# Patient Record
Sex: Male | Born: 1960 | Race: White | Hispanic: No | Marital: Married | Smoking: Former smoker
Health system: Southern US, Community
[De-identification: ages and names within clinical notes are randomized; demographics above are authoritative.]

## PROBLEM LIST (undated history)

## (undated) DIAGNOSIS — J449 Chronic obstructive pulmonary disease, unspecified: Secondary | ICD-10-CM

## (undated) DIAGNOSIS — E079 Disorder of thyroid, unspecified: Secondary | ICD-10-CM

## (undated) DIAGNOSIS — S069XAA Unspecified intracranial injury with loss of consciousness status unknown, initial encounter: Secondary | ICD-10-CM

## (undated) DIAGNOSIS — N189 Chronic kidney disease, unspecified: Secondary | ICD-10-CM

## (undated) DIAGNOSIS — K219 Gastro-esophageal reflux disease without esophagitis: Secondary | ICD-10-CM

## (undated) DIAGNOSIS — M1990 Unspecified osteoarthritis, unspecified site: Secondary | ICD-10-CM

## (undated) DIAGNOSIS — M419 Scoliosis, unspecified: Secondary | ICD-10-CM

## (undated) DIAGNOSIS — S069X9A Unspecified intracranial injury with loss of consciousness of unspecified duration, initial encounter: Secondary | ICD-10-CM

## (undated) HISTORY — DX: Gastro-esophageal reflux disease without esophagitis: K21.9

## (undated) HISTORY — DX: Chronic kidney disease, unspecified: N18.9

## (undated) HISTORY — PX: COLONOSCOPY: SHX174

## (undated) HISTORY — DX: Chronic obstructive pulmonary disease, unspecified: J44.9

## (undated) HISTORY — PX: POLYPECTOMY: SHX149

## (undated) HISTORY — DX: Unspecified osteoarthritis, unspecified site: M19.90

---

## 2004-08-23 ENCOUNTER — Emergency Department (HOSPITAL_COMMUNITY): Admission: EM | Admit: 2004-08-23 | Discharge: 2004-08-23 | Payer: Self-pay | Admitting: Family Medicine

## 2006-05-08 ENCOUNTER — Emergency Department (HOSPITAL_COMMUNITY): Admission: EM | Admit: 2006-05-08 | Discharge: 2006-05-08 | Payer: Self-pay | Admitting: Emergency Medicine

## 2006-08-09 ENCOUNTER — Encounter: Admission: RE | Admit: 2006-08-09 | Discharge: 2006-08-09 | Payer: Self-pay | Admitting: General Practice

## 2007-10-12 ENCOUNTER — Emergency Department (HOSPITAL_COMMUNITY): Admission: EM | Admit: 2007-10-12 | Discharge: 2007-10-12 | Payer: Self-pay | Admitting: Emergency Medicine

## 2007-12-14 ENCOUNTER — Emergency Department (HOSPITAL_COMMUNITY): Admission: EM | Admit: 2007-12-14 | Discharge: 2007-12-15 | Payer: Self-pay | Admitting: Emergency Medicine

## 2008-05-12 ENCOUNTER — Ambulatory Visit: Payer: Self-pay | Admitting: Nurse Practitioner

## 2008-05-12 DIAGNOSIS — R21 Rash and other nonspecific skin eruption: Secondary | ICD-10-CM

## 2008-05-12 DIAGNOSIS — M4120 Other idiopathic scoliosis, site unspecified: Secondary | ICD-10-CM | POA: Insufficient documentation

## 2008-05-12 DIAGNOSIS — M25569 Pain in unspecified knee: Secondary | ICD-10-CM

## 2008-05-12 DIAGNOSIS — G8929 Other chronic pain: Secondary | ICD-10-CM | POA: Insufficient documentation

## 2008-05-12 DIAGNOSIS — G3184 Mild cognitive impairment, so stated: Secondary | ICD-10-CM | POA: Insufficient documentation

## 2008-05-12 LAB — CONVERTED CEMR LAB
ALT: 33 units/L (ref 0–53)
Albumin: 4.2 g/dL (ref 3.5–5.2)
Alkaline Phosphatase: 53 units/L (ref 39–117)
Basophils Absolute: 0 10*3/uL (ref 0.0–0.1)
Basophils Relative: 1 % (ref 0–1)
Bilirubin Urine: NEGATIVE
Blood in Urine, dipstick: NEGATIVE
Cholesterol: 240 mg/dL — ABNORMAL HIGH (ref 0–200)
Creatinine, Ser: 1.21 mg/dL (ref 0.40–1.50)
Eosinophils Absolute: 0.1 10*3/uL (ref 0.0–0.7)
Eosinophils Relative: 1 % (ref 0–5)
Glucose, Bld: 96 mg/dL (ref 70–99)
Glucose, Urine, Semiquant: NEGATIVE
HDL: 49 mg/dL (ref >39)
Ketones, urine, test strip: NEGATIVE
LDL Cholesterol: 167 mg/dL — ABNORMAL HIGH (ref 0–99)
Lymphocytes Relative: 26 % (ref 12–46)
MCV: 96.5 fL (ref 78.0–100.0)
Monocytes Relative: 10 % (ref 3–12)
Neutrophils Relative %: 62 % (ref 43–77)
Platelets: 185 10*3/uL (ref 150–400)
RDW: 15.8 % — ABNORMAL HIGH (ref 11.5–15.5)
Total CHOL/HDL Ratio: 4.9
Urobilinogen, UA: 0.2
VLDL: 24 mg/dL (ref 0–40)
WBC: 5.8 10*3/uL (ref 4.0–10.5)
pH: 6

## 2008-05-13 ENCOUNTER — Encounter (INDEPENDENT_AMBULATORY_CARE_PROVIDER_SITE_OTHER): Payer: Self-pay | Admitting: Nurse Practitioner

## 2008-05-13 DIAGNOSIS — E039 Hypothyroidism, unspecified: Secondary | ICD-10-CM | POA: Insufficient documentation

## 2008-05-13 DIAGNOSIS — E7800 Pure hypercholesterolemia, unspecified: Secondary | ICD-10-CM

## 2008-05-13 LAB — CONVERTED CEMR LAB
Free T4: 0.36 ng/dL — ABNORMAL LOW (ref 0.89–1.80)
T3, Free: 1.6 pg/mL — ABNORMAL LOW (ref 2.3–4.2)

## 2008-05-15 ENCOUNTER — Ambulatory Visit (HOSPITAL_COMMUNITY): Admission: RE | Admit: 2008-05-15 | Discharge: 2008-05-15 | Payer: Self-pay | Admitting: Nurse Practitioner

## 2008-05-15 ENCOUNTER — Telehealth (INDEPENDENT_AMBULATORY_CARE_PROVIDER_SITE_OTHER): Payer: Self-pay | Admitting: *Deleted

## 2008-05-15 ENCOUNTER — Encounter (INDEPENDENT_AMBULATORY_CARE_PROVIDER_SITE_OTHER): Payer: Self-pay | Admitting: Nurse Practitioner

## 2008-06-04 ENCOUNTER — Ambulatory Visit: Payer: Self-pay | Admitting: Nurse Practitioner

## 2008-07-27 ENCOUNTER — Ambulatory Visit: Payer: Self-pay | Admitting: Nurse Practitioner

## 2008-07-27 LAB — CONVERTED CEMR LAB
AST: 13 units/L (ref 0–37)
Albumin: 4.3 g/dL (ref 3.5–5.2)
Bilirubin, Direct: 0.1 mg/dL (ref 0.0–0.3)
Indirect Bilirubin: 0.4 mg/dL (ref 0.0–0.9)
T3, Total: 181.3 ng/dL (ref 80.0–204.0)

## 2008-07-28 ENCOUNTER — Encounter (INDEPENDENT_AMBULATORY_CARE_PROVIDER_SITE_OTHER): Payer: Self-pay | Admitting: Nurse Practitioner

## 2008-08-27 ENCOUNTER — Encounter (INDEPENDENT_AMBULATORY_CARE_PROVIDER_SITE_OTHER): Payer: Self-pay | Admitting: Nurse Practitioner

## 2008-11-03 ENCOUNTER — Ambulatory Visit: Payer: Self-pay | Admitting: Nurse Practitioner

## 2008-11-03 LAB — CONVERTED CEMR LAB: Cholesterol, target level: 200 mg/dL

## 2008-11-04 ENCOUNTER — Encounter (INDEPENDENT_AMBULATORY_CARE_PROVIDER_SITE_OTHER): Payer: Self-pay | Admitting: Nurse Practitioner

## 2008-11-04 LAB — CONVERTED CEMR LAB
Alkaline Phosphatase: 63 units/L (ref 39–117)
BUN: 24 mg/dL — ABNORMAL HIGH (ref 6–23)
Chloride: 101 meq/L (ref 96–112)
LDL Cholesterol: 203 mg/dL — ABNORMAL HIGH (ref 0–99)
Potassium: 4.2 meq/L (ref 3.5–5.3)
Sodium: 138 meq/L (ref 135–145)
Total Bilirubin: 0.7 mg/dL (ref 0.3–1.2)
Total CHOL/HDL Ratio: 6.3
Triglycerides: 235 mg/dL — ABNORMAL HIGH (ref <150)
VLDL: 47 mg/dL — ABNORMAL HIGH (ref 0–40)

## 2008-12-15 ENCOUNTER — Ambulatory Visit: Payer: Self-pay | Admitting: Nurse Practitioner

## 2008-12-16 LAB — CONVERTED CEMR LAB
Cholesterol: 167 mg/dL (ref 0–200)
TSH: 7.342 microintl units/mL — ABNORMAL HIGH (ref 0.350–4.50)
Total CHOL/HDL Ratio: 4.3
Triglycerides: 136 mg/dL (ref <150)
VLDL: 27 mg/dL (ref 0–40)

## 2010-08-01 ENCOUNTER — Emergency Department (HOSPITAL_COMMUNITY): Admission: EM | Admit: 2010-08-01 | Discharge: 2010-08-01 | Payer: Self-pay | Admitting: Emergency Medicine

## 2011-08-24 LAB — BASIC METABOLIC PANEL
BUN: 13
Chloride: 105
GFR calc Af Amer: >60
GFR calc non Af Amer: 51 — ABNORMAL LOW
Potassium: 4.1

## 2011-08-24 LAB — DIFFERENTIAL
Eosinophils Relative: 2
Monocytes Absolute: 0.8

## 2011-08-24 LAB — CBC
HCT: 41
Hemoglobin: 13.8
Platelets: 178
WBC: 8.1

## 2011-09-12 LAB — CULTURE, ROUTINE-ABSCESS: Gram Stain: NONE SEEN

## 2012-01-12 ENCOUNTER — Emergency Department (HOSPITAL_COMMUNITY): Payer: Medicaid Other

## 2012-01-12 ENCOUNTER — Emergency Department (HOSPITAL_COMMUNITY)
Admission: EM | Admit: 2012-01-12 | Discharge: 2012-01-12 | Disposition: A | Discharge status: Home / Self Care | Payer: Medicaid Other | Attending: Emergency Medicine | Admitting: Emergency Medicine

## 2012-01-12 ENCOUNTER — Encounter (HOSPITAL_COMMUNITY): Payer: Self-pay | Admitting: Adult Health

## 2012-01-12 DIAGNOSIS — M25429 Effusion, unspecified elbow: Secondary | ICD-10-CM

## 2012-01-12 DIAGNOSIS — W1809XA Striking against other object with subsequent fall, initial encounter: Secondary | ICD-10-CM | POA: Insufficient documentation

## 2012-01-12 DIAGNOSIS — Y93K1 Activity, walking an animal: Secondary | ICD-10-CM | POA: Insufficient documentation

## 2012-01-12 DIAGNOSIS — S53106A Unspecified dislocation of unspecified ulnohumeral joint, initial encounter: Secondary | ICD-10-CM

## 2012-01-12 HISTORY — DX: Unspecified intracranial injury with loss of consciousness status unknown, initial encounter: S06.9XAA

## 2012-01-12 HISTORY — DX: Disorder of thyroid, unspecified: E07.9

## 2012-01-12 HISTORY — DX: Scoliosis, unspecified: M41.9

## 2012-01-12 HISTORY — DX: Unspecified intracranial injury with loss of consciousness of unspecified duration, initial encounter: S06.9X9A

## 2012-01-12 MED ORDER — FENTANYL CITRATE 0.05 MG/ML IJ SOLN
50.0000 ug | Freq: Once | INTRAMUSCULAR | Status: AC
  Administered 2012-01-12: 50 ug via INTRAVENOUS

## 2012-01-12 MED ORDER — FENTANYL CITRATE 0.05 MG/ML IJ SOLN
25.0000 ug | Freq: Once | INTRAMUSCULAR | Status: AC
  Administered 2012-01-12: 25 ug via INTRAVENOUS
  Filled 2012-01-12: qty 2

## 2012-01-12 MED ORDER — ETOMIDATE 2 MG/ML IV SOLN
12.0000 mg | Freq: Once | INTRAVENOUS | Status: DC
  Filled 2012-01-12: qty 10

## 2012-01-12 MED ORDER — ETOMIDATE 2 MG/ML IV SOLN
INTRAVENOUS | Status: AC | PRN
  Administered 2012-01-12: 12 mg via INTRAVENOUS

## 2012-01-12 MED ORDER — MIDAZOLAM HCL 2 MG/2ML IJ SOLN
2.0000 mg | Freq: Once | INTRAMUSCULAR | Status: AC
  Administered 2012-01-12: 2 mg via INTRAVENOUS
  Filled 2012-01-12: qty 2

## 2012-01-12 MED ORDER — OXYCODONE-ACETAMINOPHEN 5-325 MG PO TABS: ORAL_TABLET | ORAL | Status: DC

## 2012-01-12 MED ORDER — OXYCODONE-ACETAMINOPHEN 5-325 MG PO TABS
2.0000 | ORAL_TABLET | Freq: Once | ORAL | Status: AC
  Administered 2012-01-12: 2 via ORAL
  Filled 2012-01-12: qty 2

## 2012-01-12 NOTE — ED Notes (Signed)
While walking dog pt slipped and fell down 3 concrete steps. Left arm deformity, CMS intact.

## 2012-01-12 NOTE — ED Provider Notes (Signed)
History     CSN: [id number]  Arrival date & time 01/12/12  5621   First MD Initiated Contact with Patient 01/12/12 0818      Chief Complaint  Patient presents with  . Arm Pain  . Fall    (Consider location/radiation/quality/duration/timing/severity/associated sxs/prior treatment) HPI Comments: Patient reports that he was in his usual state of health had not yet drink anything, had been outside to walk his dog. His dog which is quite large told him causing the patient to fall and he must have landed on his left elbow or wrist. The patient has deformity and significant pain to his left elbow worse with that. EMS was called and has transported the patient here with an IV in place in his right arm. The patient and EMS report no other pain areas and no other known injuries. The patient is left-handed. The patient denies any hand or wrist pain, forearm pain or left shoulder pain. At this time he denies any numbness or weakness to his distal hand. Patient denies being on any blood thinners. He does have a history of low thyroid but has quit taking his medication. EMS also reports that the patient has a prior history of significant brain injury as well as scoliosis. The patient reports that he has been a sign to a physician but has not seen a primary care physician and a while. By his prior notes here, his primary care physician is a Dr. Daphine Deutscher. Patient reports that he quit smoking many years ago and does not drink. PT was given a total of 100 mcg of IV fentanyl PTA and he reports pain is much improved.    Patient is a 51 y.o. male presenting with arm pain and fall. The history is provided by the patient and the EMS personnel.  Arm Pain Pertinent negatives include no chest pain, no abdominal pain and no shortness of breath.  Fall Pertinent negatives include no numbness and no abdominal pain.    Past Medical History  Diagnosis Date  . Thyroid disease   . Brain injury   . Scoliosis     History  reviewed. No pertinent past surgical history.  History reviewed. No pertinent family history.  History  Substance Use Topics  . Smoking status: Never Smoker   . Smokeless tobacco: Not on file  . Alcohol Use: No      Review of Systems  Constitutional: Negative.   HENT: Negative for neck pain.   Respiratory: Negative for shortness of breath.   Cardiovascular: Negative for chest pain.  Gastrointestinal: Negative for abdominal pain.  Musculoskeletal: Positive for joint swelling and arthralgias. Negative for back pain.  Neurological: Negative for dizziness, weakness, light-headedness and numbness.  All other systems reviewed and are negative.    Allergies  Review of patient's allergies indicates no known allergies.  Home Medications   Current Outpatient Rx  Name Route Sig Dispense Refill  . BC HEADACHE POWDER PO Oral Take 1 packet by mouth 2 (two) times daily as needed. For pain.    . OXYCODONE-ACETAMINOPHEN 5-325 MG PO TABS  1-2 tablets po q 6 hours prn severe pain 20 tablet 0    BP 124/87  Pulse 74  Temp(Src) 97.6 F (36.4 C) (Oral)  Resp 13  SpO2 94%  Physical Exam  Nursing note and vitals reviewed. Constitutional: He is oriented to person, place, and time. He appears well-developed and well-nourished.  Neck: Full passive range of motion without pain. Neck supple. No spinous process tenderness and  no muscular tenderness present.  Pulmonary/Chest: Effort normal.  Abdominal: Soft. There is no tenderness. There is no guarding and no CVA tenderness.  Musculoskeletal:       Arms: Neurological: He is alert and oriented to person, place, and time. He has normal strength. No sensory deficit. GCS eye subscore is 4. GCS verbal subscore is 5. GCS motor subscore is 6.  Skin: Skin is warm and dry. No abrasion and no rash noted. He is not diaphoretic. No cyanosis.  Psychiatric: He has a normal mood and affect. His speech is not delayed. He is not slowed. He does not express  impulsivity or inappropriate judgment. He exhibits normal recent memory.    ED Course  Reduction of dislocation Date/Time: 01/12/2012 9:48 AM Performed by: Lear Ng. Authorized by: Lear Ng Consent: Verbal consent obtained. Written consent obtained. Risks and benefits: risks, benefits and alternatives were discussed Consent given by: patient and spouse Patient understanding: patient states understanding of the procedure being performed Patient consent: the patient's understanding of the procedure matches consent given Procedure consent: procedure consent matches procedure scheduled Relevant documents: relevant documents present and verified Imaging studies: imaging studies available Patient identity confirmed: verbally with patient Time out: Immediately prior to procedure a "time out" was called to verify the correct patient, procedure, equipment, support staff and site/side marked as required. Patient sedated: yes Sedation type: moderate (conscious) sedation Sedatives: etomidate, fentanyl and midazolam Analgesia: fentanyl Sedation start date/time: 01/12/2012 9:15 AM Sedation end date/time: 01/12/2012 9:25 AM Vitals: Vital signs were monitored during sedation. Patient tolerance: Patient tolerated the procedure well with no immediate complications.   (including critical care time)  Labs Reviewed - No data to display Dg Elbow 2 Views Left  01/12/2012  *RADIOLOGY REPORT*  Clinical Data: Post reduction  LEFT ELBOW - 2 VIEW  Comparison: Plain film 01/12/2012  Findings: The elbow joint has been relocated.  No evidence of fracture.  IMPRESSION: Successful reduction of left elbow dislocation  Original Report Authenticated By: Genevive Bi, M.D.   Dg Elbow Complete Left  01/12/2012  *RADIOLOGY REPORT*  Clinical Data: Status post fall.  LEFT ELBOW - COMPLETE 3+ VIEW  Comparison: None  Findings: There has been a posterior dislocation of the elbow joint.  Tiny ossific density within  the olecranon fossa is identified which may represent a small fracture fragment.  Donor site unknown.  A large joint effusion is present.  IMPRESSION:  1.  Posterior elbow joint dislocation. 2.  Tiny ossific density noted within the olecranon fossa is of uncertain significance but may represent a tiny fracture.  Original Report Authenticated By: Rosealee Albee, M.D.     1. Dislocation, elbow closed   2. Elbow joint effusion       MDM  Pt has been NPO, will keep NPO.  Start maintenance IVF's, IV analgesics and will obtain plain films to assess for possible fracture and/or dislocation.  Pt is left handed.  Distal pulse and gross sensation and motor are intact currently.        8:45 AM Elbow is dislocated, without any obvious fracture on my view.  Will sedate and reduce.  Pt and spouse are informed and will give verbal and written consent.   Risks and benefits discussed, pt on monitor and O2.      9:50 AM On my view, portable post reduction view shows reduction of dislocation.  Will need follow up.  Pt has intact ROM of wrist, cap refill and RP are intact.  Compartments remain soft although sig elbow effusion is present.  Placed in posterior splint and sling.  Will refer to Dr. Lestine Box with [facility].     12:03 PM Spoke to Dr. Mina Marble who will see pt on Tuesday.  Pt is to call office for appt time.  Gavin Pound. Toneka Fullen, MD 01/12/12 1203

## 2012-01-12 NOTE — ED Notes (Signed)
EAV:[id number]> Expected date:01/12/12<BR> Expected time: 7:57 AM<BR> Means of arrival:Ambulance<BR> Comments:<BR> Fall-obvious deformity to elbow

## 2012-01-12 NOTE — Discharge Instructions (Signed)
 Elbow Dislocation Elbow dislocation is the displacement of the bones that form the elbow joint. Three bones come together to form the elbow. The humerus is the bone in the upper arm. The radius and ulna are the 2 bones in the forearm that form the lower part of the elbow. The elbow is held in place by very strong, fibrous tissues (ligaments) that connect the bones to each other. CAUSES Elbow dislocations are not common. Typically, they occur when a person falls forward with hands and elbows outstretched. The force of the impact is sent to the elbow. Usually, there is a twisting motion in this force. Elbow dislocations also happen during car crashes when passengers reach out to brace themselves during the impact. RISK FACTORS Although dislocation of the elbow can happen to anyone, some people are at greater risk than others. People at increased risk of elbow dislocation include:  People born with greater looseness in their ligaments.   People born with an ulna bone that has a shallow groove for the elbow hinge joint.  SYMPTOMS Symptoms of a complete elbow dislocation usually are obvious. They include extreme pain and the appearance of a deformed arm.  Symptoms of a partial dislocation may not be obvious. Your elbow may move somewhat, but you may have pain and swelling. Also, there will likely be bruising on the inside and outside of your elbow where ligaments have been stretched or torn.  DIAGNOSIS  To diagnose elbow dislocation, your caregiver will perform a physical exam. During this exam, your caregiver will check your arm for tenderness, swelling, and deformity. The skin around your arm and the circulation in your arm also will be checked. Your pulse will be checked at your wrist. If your artery is injured during dislocation, your hand will be cool to the touch and may be white or purple in color. Your caregiver also may check your arm and your ability to move your wrist and fingers to see if you  had any damage to your nerves during dislocation. An X-ray exam also may be done to determine if there is bone injury. Results of an X-ray exam can help show the direction of the dislocation. If you have a simple dislocation, there is no major bone injury. If you have a complex dislocation, you may have broken bones (fractures) associated with the ligament injuries. TREATMENT For a simple elbow dislocation, your bones can usually be realigned in a procedure called a reduction. This is a treatment in which your bones are manually moved back into place either with the use of numbing medicine (regional anesthetic) around your elbow or medicine to make you sleep (general anesthetic). Then your elbow is kept immobile with a sling or a splint for 2 to 3 weeks. This is followed with physical therapy to help your joint move again. Complex elbow dislocation may require surgery to restore joint alignment and repair ligaments. After surgery, your elbow may be protected with an external hinge. This device keeps your elbow from dislocating again while motion exercises are done. Additional surgery may be needed to repair any injuries to blood vessels and nerves or bones and ligaments or to relieve pressure from excessive swelling around the muscles. HOME CARE INSTRUCTIONS The following measures can help to reduce pain and hasten the healing process:  Rest your injured joint. Do not move it. Avoid activities similar to the one that caused your injury.   Exercise your hand and fingers as instructed by your caregiver.   Apply ice  to your injured joint for 1 to 2 days after your reduction or as directed by your caregiver. Applying ice helps to reduce inflammation and pain.   Put ice in a plastic bag.   Place a towel between your skin and the bag.   Leave the ice on for 15 to 20 minutes at a time, every couple of hours while you are awake.   Elevate your arm above your heart and move your wrist and fingers as  instructed by your caregiver to help limit swelling.   Take over-the-counter or prescription medicines for pain as directed by your caregiver.  SEEK IMMEDIATE MEDICAL CARE IF:  Your splint becomes damaged.   You have an external hinge and it becomes loose or will not move.   You have an external hinge and you develop drainage around the pins.   Your pain becomes worse rather than better.   You lose feeling in your hand or fingers.  MAKE SURE YOU:  Understand these instructions.   Will watch your condition.   Will get help right away if you are not doing well or get worse.  Document Released: 11/14/2001 Document Revised: 08/02/2011 Document Reviewed: 04/20/2011 [facility] Patient Information 2012 [place], [place].  Narcotic and benzodiazepine use may cause drowsiness, slowed breathing or dependence.  Please use with caution and do not drive, operate machinery or watch young children alone while taking them.  Taking combinations of these medications or drinking alcohol will potentiate these effects.

## 2012-01-30 ENCOUNTER — Ambulatory Visit: Payer: Medicaid Other | Attending: Orthopedic Surgery | Admitting: Occupational Therapy

## 2012-01-30 DIAGNOSIS — IMO0001 Reserved for inherently not codable concepts without codable children: Secondary | ICD-10-CM | POA: Insufficient documentation

## 2012-01-30 DIAGNOSIS — M25539 Pain in unspecified wrist: Secondary | ICD-10-CM | POA: Insufficient documentation

## 2012-01-30 DIAGNOSIS — R609 Edema, unspecified: Secondary | ICD-10-CM | POA: Insufficient documentation

## 2013-09-20 ENCOUNTER — Encounter (HOSPITAL_COMMUNITY): Payer: Self-pay | Admitting: Emergency Medicine

## 2013-09-20 ENCOUNTER — Emergency Department (HOSPITAL_COMMUNITY)
Admission: EM | Admit: 2013-09-20 | Discharge: 2013-09-20 | Disposition: A | Discharge status: Home / Self Care | Payer: Medicaid Other | Attending: Emergency Medicine | Admitting: Emergency Medicine

## 2013-09-20 DIAGNOSIS — Z8639 Personal history of other endocrine, nutritional and metabolic disease: Secondary | ICD-10-CM | POA: Insufficient documentation

## 2013-09-20 DIAGNOSIS — S139XXA Sprain of joints and ligaments of unspecified parts of neck, initial encounter: Secondary | ICD-10-CM | POA: Insufficient documentation

## 2013-09-20 DIAGNOSIS — Z862 Personal history of diseases of the blood and blood-forming organs and certain disorders involving the immune mechanism: Secondary | ICD-10-CM | POA: Insufficient documentation

## 2013-09-20 DIAGNOSIS — S161XXA Strain of muscle, fascia and tendon at neck level, initial encounter: Secondary | ICD-10-CM

## 2013-09-20 DIAGNOSIS — X500XXA Overexertion from strenuous movement or load, initial encounter: Secondary | ICD-10-CM | POA: Insufficient documentation

## 2013-09-20 DIAGNOSIS — Z87820 Personal history of traumatic brain injury: Secondary | ICD-10-CM | POA: Insufficient documentation

## 2013-09-20 DIAGNOSIS — Y929 Unspecified place or not applicable: Secondary | ICD-10-CM | POA: Insufficient documentation

## 2013-09-20 DIAGNOSIS — Y9389 Activity, other specified: Secondary | ICD-10-CM | POA: Insufficient documentation

## 2013-09-20 MED ORDER — DIAZEPAM 5 MG PO TABS: 5.0000 mg | ORAL_TABLET | Freq: Four times a day (QID) | ORAL | Status: DC | PRN

## 2013-09-20 MED ORDER — IBUPROFEN 800 MG PO TABS: 800.0000 mg | ORAL_TABLET | Freq: Three times a day (TID) | ORAL | Status: DC

## 2013-09-20 MED ORDER — HYDROCODONE-ACETAMINOPHEN 5-325 MG PO TABS
1.0000 | ORAL_TABLET | Freq: Once | ORAL | Status: AC
  Administered 2013-09-20: 1 via ORAL
  Filled 2013-09-20: qty 1

## 2013-09-20 MED ORDER — DIAZEPAM 5 MG PO TABS
5.0000 mg | ORAL_TABLET | Freq: Once | ORAL | Status: AC
  Administered 2013-09-20: 5 mg via ORAL
  Filled 2013-09-20: qty 1

## 2013-09-20 MED ORDER — KETOROLAC TROMETHAMINE 60 MG/2ML IM SOLN
60.0000 mg | Freq: Once | INTRAMUSCULAR | Status: AC
  Administered 2013-09-20: 60 mg via INTRAMUSCULAR
  Filled 2013-09-20: qty 2

## 2013-09-20 NOTE — ED Notes (Signed)
Pt c/o L neck stiffness/pain, onset yesterday after chopping wood.

## 2013-09-20 NOTE — ED Provider Notes (Signed)
Medical screening examination/treatment/procedure(s) were performed by non-physician practitioner and as supervising physician I was immediately available for consultation/collaboration.   Audree Camel, MD 09/20/13 1420

## 2013-09-20 NOTE — ED Provider Notes (Signed)
CSN: [id number]     Arrival date & time 09/20/13  0102 History   None    Chief Complaint  Patient presents with  . Torticollis   (Consider location/radiation/quality/duration/timing/severity/associated sxs/prior Treatment) HPI History provided by pt.   Pt was chopping wood yesterday evening and developed intermittent, non-radiating, sharp pain in L posterior neck shortly after.  Aggravated by slight movement.  No associated extremity weakness/paresthesias.  Took a BC powder w/out relief.  Past Medical History  Diagnosis Date  . Thyroid disease   . Brain injury   . Scoliosis    History reviewed. No pertinent past surgical history. No family history on file. History  Substance Use Topics  . Smoking status: Never Smoker   . Smokeless tobacco: Not on file  . Alcohol Use: No    Review of Systems  All other systems reviewed and are negative.    Allergies  Review of patient's allergies indicates no known allergies.  Home Medications   Current Outpatient Rx  Name  Route  Sig  Dispense  Refill  . Aspirin-Salicylamide-Caffeine (BC HEADACHE POWDER PO)   Oral   Take 1 packet by mouth 2 (two) times daily as needed (pain). For pain.          BP 132/75  Pulse 78  Temp(Src) 97.5 F (36.4 C) (Oral)  Resp 18  SpO2 96% Physical Exam  Nursing note and vitals reviewed. Constitutional: He is oriented to person, place, and time. He appears well-developed and well-nourished. No distress.  HENT:  Head: Normocephalic and atraumatic.  Eyes:  Normal appearance  Neck: Normal range of motion.  No carotid bruit  Cardiovascular: Normal rate and regular rhythm.   Pulmonary/Chest: Effort normal and breath sounds normal. No stridor. No respiratory distress.  Musculoskeletal: Normal range of motion.  Cervical spine non-tender.  L trap ttp.  Pain aggravated by head movement.  5/5 bicep/tricep/grip strength bilaterally. 2+ radial pulse and distal sensation intact.      Neurological: He is  alert and oriented to person, place, and time.  Skin: Skin is warm and dry. No rash noted.  Psychiatric: He has a normal mood and affect. His behavior is normal.    ED Course  Procedures (including critical care time) Labs Review Labs Reviewed - No data to display Imaging Review No results found.  EKG Interpretation   None       MDM   1. Cervical strain, initial encounter    52yo M presents w/ L neck pain s/p chopping wood last night.  He is unable to move his head d/t pain on exam.  No NV deficits extremities.  Suspect strain of L trapezius.  Vicodin, valium and IM toradol ordered for pain.  Will reassess shortly.  2:21 AM   Pain improved.  Pt d/c'd home w/ valium and 800mg  ibuprofen.     Otilio Miu, PA-C 09/20/13 [phone]

## 2014-12-04 HISTORY — PX: LYMPHADENECTOMY: SHX15

## 2015-01-10 ENCOUNTER — Emergency Department (HOSPITAL_COMMUNITY)
Admission: EM | Admit: 2015-01-10 | Discharge: 2015-01-10 | Disposition: A | Discharge status: Home / Self Care | Payer: Medicaid Other | Attending: Emergency Medicine | Admitting: Emergency Medicine

## 2015-01-10 ENCOUNTER — Emergency Department (HOSPITAL_COMMUNITY): Payer: Medicaid Other

## 2015-01-10 ENCOUNTER — Encounter (HOSPITAL_COMMUNITY): Payer: Self-pay | Admitting: *Deleted

## 2015-01-10 DIAGNOSIS — Z7982 Long term (current) use of aspirin: Secondary | ICD-10-CM | POA: Insufficient documentation

## 2015-01-10 DIAGNOSIS — Z8739 Personal history of other diseases of the musculoskeletal system and connective tissue: Secondary | ICD-10-CM | POA: Insufficient documentation

## 2015-01-10 DIAGNOSIS — Z791 Long term (current) use of non-steroidal anti-inflammatories (NSAID): Secondary | ICD-10-CM | POA: Diagnosis not present

## 2015-01-10 DIAGNOSIS — R51 Headache: Secondary | ICD-10-CM | POA: Diagnosis present

## 2015-01-10 DIAGNOSIS — R2230 Localized swelling, mass and lump, unspecified upper limb: Secondary | ICD-10-CM

## 2015-01-10 DIAGNOSIS — G44219 Episodic tension-type headache, not intractable: Secondary | ICD-10-CM

## 2015-01-10 DIAGNOSIS — R7989 Other specified abnormal findings of blood chemistry: Secondary | ICD-10-CM | POA: Diagnosis not present

## 2015-01-10 DIAGNOSIS — R591 Generalized enlarged lymph nodes: Secondary | ICD-10-CM | POA: Diagnosis not present

## 2015-01-10 DIAGNOSIS — R590 Localized enlarged lymph nodes: Secondary | ICD-10-CM

## 2015-01-10 LAB — BASIC METABOLIC PANEL
Anion gap: 8 (ref 5–15)
BUN: 14 mg/dL (ref 6–23)
CO2: 28 mmol/L (ref 19–32)
Calcium: 8.5 mg/dL (ref 8.4–10.5)
Chloride: 104 mmol/L (ref 96–112)
Creatinine, Ser: 1.74 mg/dL — ABNORMAL HIGH (ref 0.50–1.35)
GFR calc Af Amer: 50 mL/min — ABNORMAL LOW (ref >90)
GFR calc non Af Amer: 43 mL/min — ABNORMAL LOW (ref >90)
Glucose, Bld: 87 mg/dL (ref 70–99)
Potassium: 4 mmol/L (ref 3.5–5.1)
Sodium: 140 mmol/L (ref 135–145)

## 2015-01-10 LAB — CBC WITH DIFFERENTIAL/PLATELET
Basophils Absolute: 0.1 10*3/uL (ref 0.0–0.1)
Basophils Relative: 1 % (ref 0–1)
Eosinophils Absolute: 0.3 10*3/uL (ref 0.0–0.7)
Eosinophils Relative: 3 % (ref 0–5)
HCT: 42.3 % (ref 39.0–52.0)
Hemoglobin: 13.5 g/dL (ref 13.0–17.0)
Lymphocytes Relative: 24 % (ref 12–46)
Lymphs Abs: 2.5 10*3/uL (ref 0.7–4.0)
MCH: 30.3 pg (ref 26.0–34.0)
MCHC: 31.9 g/dL (ref 30.0–36.0)
MCV: 95.1 fL (ref 78.0–100.0)
Monocytes Absolute: 1.1 10*3/uL — ABNORMAL HIGH (ref 0.1–1.0)
Monocytes Relative: 11 % (ref 3–12)
Neutro Abs: 6.2 10*3/uL (ref 1.7–7.7)
Neutrophils Relative %: 61 % (ref 43–77)
Platelets: 213 10*3/uL (ref 150–400)
RBC: 4.45 MIL/uL (ref 4.22–5.81)
RDW: 15.6 % — ABNORMAL HIGH (ref 11.5–15.5)
WBC: 10.2 10*3/uL (ref 4.0–10.5)

## 2015-01-10 MED ORDER — HYDROCODONE-ACETAMINOPHEN 5-325 MG PO TABS: 1.0000 | ORAL_TABLET | Freq: Four times a day (QID) | ORAL | Status: DC | PRN

## 2015-01-10 MED ORDER — HYDROCODONE-ACETAMINOPHEN 5-325 MG PO TABS
1.0000 | ORAL_TABLET | Freq: Once | ORAL | Status: AC
  Administered 2015-01-10: 1 via ORAL
  Filled 2015-01-10: qty 1

## 2015-01-10 NOTE — ED Provider Notes (Signed)
CSN: [id number]     Arrival date & time 01/10/15  0934 History   First MD Initiated Contact with Patient 01/10/15 1024     Chief Complaint  Patient presents with  . abscess   . Headache     (Consider location/radiation/quality/duration/timing/severity/associated sxs/prior Treatment) HPI Comments: Samuel Luna is a 54 y.o. male with a PMHx of scoliosis, brain injury, and thyroid disease, who presents to the ED with complaints of left axilla mass that began 1 week ago and has increased in size since onset. He states that it started out the size of a quarter and has grown to a golf ball sized area. He states it has become more painful, stating that it is 8/10 constant throbbing pain that radiates to the posterior aspect of the left side of his neck, with no known aggravating factors, unrelieved with aspirin and BC powders, and relieved slightly with pressure to the area. He reports that there has been some warmth, but no drainage, induration, erythema, or fluctuance. He states that last week he had a fever of 102, but this resolved by Monday and has not recurred. He endorses an associated headache to the occipital area of his head stating that it is similar to his prior headaches, coming intermittently, relieved with aspirin and BC powders, and believes it is coming from the pain in his axilla. Additionally his spouse states that he has been more fatigued recently, although the patient doesn't endorse feeling tired. The spouse also states that the patient had some lightheadedness last week, and when asked the patient states that it occurred intermittently and has since resolved entirely and he is not having any ongoing lightheadedness. Patient denies any fevers at this time, chills, chest pain, shortness breath, abdominal pain, nausea, vomiting, diarrhea, constipation, dyspnea., Hematuria, melena, hematochezia, vision changes, tinnitus, vertigo, dizziness, ongoing lightheadedness, numbness, tingling,  weakness, IV drug use, skin injury, alcohol use, or diabetes.  Patient is a 54 y.o. male presenting with headaches and abscess. The history is provided by the patient and the spouse. No language interpreter was used.  Headache Pain location:  Occipital Quality: throbbing. Radiates to:  Does not radiate Severity currently:  8/10 Severity at highest:  8/10 Onset quality:  Gradual Duration:  1 week Timing:  Intermittent Progression:  Unchanged Chronicity:  Recurrent Similar to prior headaches: yes   Relieved by:  Aspirin and NSAIDs Worsened by:  Nothing tried Ineffective treatments:  None tried Associated symptoms: fatigue, neck pain (L sided) and swollen glands (L axilla)   Associated symptoms: no abdominal pain, no back pain, no blurred vision, no cough, no diarrhea, no dizziness, no ear pain, no pain, no facial pain, no fever (subjective fever last week, now resolved), no focal weakness, no hearing loss, no loss of balance, no nausea, no neck stiffness, no numbness, no paresthesias, no photophobia, no sore throat, no tingling, no URI, no visual change, no vomiting and no weakness   Abscess Location:  Shoulder/arm Shoulder/arm abscess location:  L axilla Size:  "golf ball" Abscess quality: painful and warmth   Abscess quality: not draining, no fluctuance, no induration, no itching, no redness and not weeping   Red streaking: no   Duration:  1 week Progression:  Worsening Pain details:    Quality:  Throbbing   Severity:  Moderate   Duration:  1 week   Timing:  Constant   Progression:  Unchanged Chronicity:  New Context: not diabetes, not immunosuppression, not injected drug use, not insect bite/sting and not skin  injury   Relieved by: pressure to the area. Worsened by:  Nothing tried Ineffective treatments:  Aspirin and NSAIDs Associated symptoms: fatigue and headaches   Associated symptoms: no fever (subjective fever last week, now resolved), no nausea and no vomiting      Past Medical History  Diagnosis Date  . Thyroid disease   . Brain injury   . Scoliosis    History reviewed. No pertinent past surgical history. History reviewed. No pertinent family history. History  Substance Use Topics  . Smoking status: Never Smoker   . Smokeless tobacco: Not on file  . Alcohol Use: No    Review of Systems  Constitutional: Positive for fatigue. Negative for fever (subjective fever last week, now resolved), chills and diaphoresis.  HENT: Negative for ear pain, hearing loss and sore throat.   Eyes: Negative for blurred vision, photophobia, pain and visual disturbance.  Respiratory: Negative for cough and shortness of breath.   Cardiovascular: Negative for chest pain.  Gastrointestinal: Negative for nausea, vomiting, abdominal pain, diarrhea, constipation and blood in stool.  Genitourinary: Negative for dysuria and hematuria.  Musculoskeletal: Positive for neck pain (L sided). Negative for back pain, arthralgias and neck stiffness.  Skin: Negative for color change.       +L axilla swelling  Allergic/Immunologic: Negative for immunocompromised state.  Neurological: Positive for headaches. Negative for dizziness, focal weakness, syncope, weakness, light-headedness (previously had intermittently, now resolved), numbness, paresthesias and loss of balance.  Hematological: Positive for adenopathy (L axilla).  Psychiatric/Behavioral: Negative for confusion.   10 Systems reviewed and are negative for acute change except as noted in the HPI.    Allergies  Review of patient's allergies indicates no known allergies.  Home Medications   Prior to Admission medications   Medication Sig Start Date End Date Taking? Authorizing Provider  Aspirin-Salicylamide-Caffeine (BC HEADACHE POWDER PO) Take 1 packet by mouth 2 (two) times daily as needed (pain). For pain.   Yes Historical Provider, MD  diazepam (VALIUM) 5 MG tablet Take 1 tablet (5 mg total) by mouth every 6 (six)  hours as needed (spasms). 09/20/13   Ephraim Hamburger, MD  ibuprofen (ADVIL,MOTRIN) 800 MG tablet Take 1 tablet (800 mg total) by mouth 3 (three) times daily. 09/20/13   Ephraim Hamburger, MD   BP 134/74 mmHg  Pulse 84  Temp(Src) 97.4 F (36.3 C) (Oral)  Resp 16  SpO2 99% Physical Exam  Constitutional: He is oriented to person, place, and time. Vital signs are normal. He appears well-developed and well-nourished.  Non-toxic appearance. No distress.  Afebrile nontoxic NAD  HENT:  Head: Normocephalic and atraumatic.  Mouth/Throat: Mucous membranes are normal.  Lacking dentitia MMM  Eyes: Conjunctivae and EOM are normal. Pupils are equal, round, and reactive to light. Right eye exhibits no discharge. Left eye exhibits no discharge.  PERRL, EOMI, no nystagmus  Neck: Normal range of motion. Neck supple. Muscular tenderness present. No spinous process tenderness present. No rigidity. Normal range of motion present.    FROM intact without spinous process or TTP, no bony stepoffs or deformities, mild L sided paracervical TTP with mild muscle spasms. No rigidity or meningeal signs. No bruising or swelling.   Cardiovascular: Normal rate, regular rhythm, normal heart sounds and intact distal pulses.  Exam reveals no gallop and no friction rub.   No murmur heard. Pulmonary/Chest: Effort normal and breath sounds normal. No respiratory distress. He has no decreased breath sounds. He has no wheezes. He has no rhonchi. He has  no rales.  Abdominal: Soft. Normal appearance and bowel sounds are normal. He exhibits no distension. There is no tenderness. There is no rigidity, no rebound, no guarding, no CVA tenderness, no tenderness at McBurney's point and negative Murphy's sign.  Musculoskeletal: Normal range of motion.  MAE x4 Strength 5/5 in all extremities Sensation grossly intact in all extremities Distal pulses intact FROM intact at b/l shoulders and elbows. L axilla with ~5cm diameter mass without  fluctuance or induration, no erythema or warmth, no cellulitic changes, very TTP, mobile and rubbery.   Lymphadenopathy:    He has axillary adenopathy (L axilla).  L axilla with ?LAD, as noted in MSK exam  Neurological: He is alert and oriented to person, place, and time. He has normal strength. No cranial nerve deficit or sensory deficit. He displays a negative Romberg sign. Coordination and gait normal. GCS eye subscore is 4. GCS verbal subscore is 5. GCS motor subscore is 6.  CN 2-12 grossly intact A&O x4 GCS 15 Sensation and strength intact Gait nonataxic Coordination with finger-to-nose WNL Neg romberg, neg pronator drift   Skin: Skin is warm, dry and intact. No rash noted. No erythema.  No erythema or warmth to L axilla Scab to L antecubital region noted, overlying a vein, concerning for possible track mark. No erythema, warmth, or drainage.   Psychiatric: He has a normal mood and affect.  Nursing note and vitals reviewed.   ED Course  Procedures (including critical care time) Labs Review Labs Reviewed  CBC WITH DIFFERENTIAL/PLATELET - Abnormal; Notable for the following:    RDW 15.6 (*)    Monocytes Absolute 1.1 (*)    All other components within normal limits  BASIC METABOLIC PANEL - Abnormal; Notable for the following:    Creatinine, Ser 1.74 (*)    GFR calc non Af Amer 43 (*)    GFR calc Af Amer 50 (*)    All other components within normal limits    Imaging Review [place] Chest  01/10/2015   CLINICAL DATA:  Left axillary lump/swelling x 1.5 weeks, fever  EXAM: CHEST ULTRASOUND  COMPARISON:  None.  FINDINGS: Palpable abnormality corresponds to multiple enlarged lymph nodes in the left axilla, as follows:  --2.7 x 2.0 x 2.7 cm, with vascularity, with preservation of the fatty hilum  --3.1 x 2.6 x 3.0 cm, without vascularity, with preservation of the fatty hilum  --3.0 x 1.6 x 2.3 cm, with vascularity, with replacement of the fatty hilum  IMPRESSION: Palpable abnormality  corresponds to multiple enlarged lymph nodes in the left axilla, measuring up to 2.6 cm short axis, as above.  Given the abnormal appearance, unless these are clinically thought to be infectious, percutaneous sampling is suggested.   Electronically Signed   By: Julian Hy M.D.   On: 01/10/2015 14:48     EKG Interpretation None      MDM   Final diagnoses:  Lump of axilla  Lymphadenopathy, axillary  Episodic tension-type headache, not intractable  Elevated serum creatinine    54 y.o. male with L axilla mass that has increased in size.  Area does not appear to be abscess, not erythematous or warm to touch, seems deeper and more consistent with a lymph node. Fatigued and has had some lightheadedness intermittently but this is currently resolved. Also states he has a slight headache that comes and goes, seems to be pain that is radiating up from his axilla, tenderness to paracervical muscles on L side. No red flag s/sx for  headache, nonfocal neurologic exam, doubt SAH, CVA/TIA, meningitis, or other intracranial abnormality requiring emergent CT imaging. My concern at this point is possibility for hodkin's lymphoma given this unilateral large LAD and nonspecific symptoms. Will check basic labs, give pain med, and get ultrasound. I am also suspicious that the pt is not being honest about IVDU, given the finding of a possible track mark on L antecubital area.   3:09 PM Pt's headache resolved, pain improved with vicodin. CBC w/diff unremarkable. BMP showing mildly increased Cr, which is close to prior lab readings. Discussed w/pt importance of oral hydration. U/S findings show 3 separate lymph nodes measuring measuring ~3cm x 3cm each. Will need to f/up with [facility] to have biopsy performed to fully evaluate what this LAD came from. Will give him norco for headaches/pain, and referral to [facility]. I explained the diagnosis and have given explicit precautions to  return to the ER including for any other new or worsening symptoms. The patient understands and accepts the medical plan as it's been dictated and I have answered their questions. Discharge instructions concerning home care and prescriptions have been given. The patient is STABLE and is discharged to home in good condition.  BP 97/66 mmHg  Pulse 86  Temp(Src) 97.6 F (36.4 C) (Oral)  Resp 16  SpO2 99%  Meds ordered this encounter  Medications  . HYDROcodone-acetaminophen (NORCO/VICODIN) 5-325 MG per tablet 1 tablet    Sig:   . HYDROcodone-acetaminophen (NORCO/VICODIN) 5-325 MG per tablet    Sig: Take 1 tablet by mouth every 6 (six) hours as needed for moderate pain or severe pain.    Dispense:  10 tablet    Refill:  0    Order Specific Question:  Supervising Provider    Answer:  Johnna Acosta [address] [place], PA-C 01/10/15 1512  Varney Biles, MD 01/12/15 [phone]

## 2015-01-10 NOTE — ED Notes (Addendum)
Assumed care of patient, patient resting comfortably with family at bedside, medicated for pain. Awaiting [place].

## 2015-01-10 NOTE — ED Notes (Signed)
Pt and family report pt initially had a small bump in left axilla 1 week ago. Area has continued to grow, now size larger than ping pong ball. Last week pt had fever. Pt reports intermittent headache as well. Pain 8/10.

## 2015-01-10 NOTE — Discharge Instructions (Signed)
The area of swelling in your under arm was found to be 3 separate lymph nodes that are enlarged and will need to be biopsied. Your headache is likely from taking BC powders or maybe from referred pain from your arm. Use ibuprofen or norco as needed for pain. Stay well hydrated. Don't drive while taking norco. Make an appointment with [facility] for ongoing care of your lymph node swelling (see your ultrasound results below). Return to the ER for changes or worsening symptoms.   ULTRASOUND RESULTS: Impression: will need biopsy for these Palpable abnormality corresponds to multiple enlarged lymph nodes in the left axilla, as follows: --2.7 x 2.0 x 2.7 cm, with vascularity, with preservation of the fatty hilum --3.1 x 2.6 x 3.0 cm, without vascularity, with preservation of the fatty hilum --3.0 x 1.6 x 2.3 cm, with vascularity, with replacement of the fatty hilum   Swollen Lymph Nodes The lymphatic system filters fluid from around cells. It is like a system of blood vessels. These channels carry lymph instead of blood. The lymphatic system is an important part of the immune (disease fighting) system. When people talk about "swollen glands in the neck," they are usually talking about swollen lymph nodes. The lymph nodes are like the little traps for infection. You and your caregiver may be able to feel lymph nodes, especially swollen nodes, in these common areas: the groin (inguinal area), armpits (axilla), and above the clavicle (supraclavicular). You may also feel them in the neck (cervical) and the back of the head just above the hairline (occipital). Swollen glands occur when there is any condition in which the body responds with an allergic type of reaction. For instance, the glands in the neck can become swollen from insect bites or any type of minor infection on the head. These are very noticeable in children with only minor problems. Lymph nodes may also become swollen when there is  a tumor or problem with the lymphatic system, such as Hodgkin's disease. TREATMENT   Most swollen glands do not require treatment. They can be observed (watched) for a short period of time, if your caregiver feels it is necessary. Most of the time, observation is not necessary.  Antibiotics (medicines that kill germs) may be prescribed by your caregiver. Your caregiver may prescribe these if he or she feels the swollen glands are due to a bacterial (germ) infection. Antibiotics are not used if the swollen glands are caused by a virus. HOME CARE INSTRUCTIONS   Take medications as directed by your caregiver. Only take over-the-counter or prescription medicines for pain, discomfort, or fever as directed by your caregiver. SEEK MEDICAL CARE IF:   If you begin to run a temperature greater than 102 F (38.9 C), or as your caregiver suggests. MAKE SURE YOU:   Understand these instructions.  Will watch your condition.  Will get help right away if you are not doing well or get worse. Document Released: 11/10/2002 Document Revised: 02/12/2012 Document Reviewed: 11/20/2005 [facility] Patient Information 2015 [place], [place]. This information is not intended to replace advice given to you by your health care provider. Make sure you discuss any questions you have with your health care provider.  Tension Headache A tension headache is a feeling of pain, pressure, or aching often felt over the front and sides of the head. The pain can be dull or can feel tight (constricting). It is the most common type of headache. Tension headaches are not normally associated with nausea or vomiting  and do not get worse with physical activity. Tension headaches can last 30 minutes to several days.  CAUSES  The exact cause is not known, but it may be caused by chemicals and hormones in the brain that lead to pain. Tension headaches often begin after stress, anxiety, or depression. Other triggers may  include:  Alcohol.  Caffeine (too much or withdrawal).  Respiratory infections (colds, flu, sinus infections).  Dental problems or teeth clenching.  Fatigue.  Holding your head and neck in one position too long while using a computer. SYMPTOMS   Pressure around the head.   Dull, aching head pain.   Pain felt over the front and sides of the head.   Tenderness in the muscles of the head, neck, and shoulders. DIAGNOSIS  A tension headache is often diagnosed based on:   Symptoms.   Physical examination.   A CT scan or MRI of your head. These tests may be ordered if symptoms are severe or unusual. TREATMENT  Medicines may be given to help relieve symptoms.  HOME CARE INSTRUCTIONS   Only take over-the-counter or prescription medicines for pain or discomfort as directed by your caregiver.   Lie down in a dark, quiet room when you have a headache.   Keep a journal to find out what may be triggering your headaches. For example, write down:  What you eat and drink.  How much sleep you get.  Any change to your diet or medicines.  Try massage or other relaxation techniques.   Ice packs or heat applied to the head and neck can be used. Use these 3 to 4 times per day for 15 to 20 minutes each time, or as needed.   Limit stress.   Sit up straight, and do not tense your muscles.   Quit smoking if you smoke.  Limit alcohol use.  Decrease the amount of caffeine you drink, or stop drinking caffeine.  Eat and exercise regularly.  Get 7 to 9 hours of sleep, or as recommended by your caregiver.  Avoid excessive use of pain medicine as recurrent headaches can occur.  SEEK MEDICAL CARE IF:   You have problems with the medicines you were prescribed.  Your medicines do not work.  You have a change from the usual headache.  You have nausea or vomiting. SEEK IMMEDIATE MEDICAL CARE IF:   Your headache becomes severe.  You have a fever.  You have a stiff  neck.  You have loss of vision.  You have muscular weakness or loss of muscle control.  You lose your balance or have trouble walking.  You feel faint or pass out.  You have severe symptoms that are different from your first symptoms. MAKE SURE YOU:   Understand these instructions.  Will watch your condition.  Will get help right away if you are not doing well or get worse. Document Released: 11/20/2005 Document Revised: 02/12/2012 Document Reviewed: 11/10/2011 [facility] Patient Information 2015 [place], [place]. This information is not intended to replace advice given to you by your health care provider. Make sure you discuss any questions you have with your health care provider.

## 2015-03-16 ENCOUNTER — Encounter (HOSPITAL_COMMUNITY): Payer: Self-pay | Admitting: Emergency Medicine

## 2015-03-16 ENCOUNTER — Emergency Department (INDEPENDENT_AMBULATORY_CARE_PROVIDER_SITE_OTHER)
Admission: EM | Admit: 2015-03-16 | Discharge: 2015-03-16 | Disposition: A | Discharge status: Home / Self Care | Payer: Medicaid Other | Source: Home / Self Care | Attending: Family Medicine | Admitting: Family Medicine

## 2015-03-16 ENCOUNTER — Emergency Department (HOSPITAL_COMMUNITY): Payer: Medicaid Other

## 2015-03-16 ENCOUNTER — Emergency Department (HOSPITAL_COMMUNITY)
Admission: EM | Admit: 2015-03-16 | Discharge: 2015-03-16 | Disposition: A | Discharge status: Home / Self Care | Payer: Medicaid Other | Attending: Emergency Medicine | Admitting: Emergency Medicine

## 2015-03-16 DIAGNOSIS — Z8639 Personal history of other endocrine, nutritional and metabolic disease: Secondary | ICD-10-CM | POA: Diagnosis not present

## 2015-03-16 DIAGNOSIS — Z87828 Personal history of other (healed) physical injury and trauma: Secondary | ICD-10-CM | POA: Insufficient documentation

## 2015-03-16 DIAGNOSIS — M419 Scoliosis, unspecified: Secondary | ICD-10-CM | POA: Diagnosis not present

## 2015-03-16 DIAGNOSIS — R222 Localized swelling, mass and lump, trunk: Secondary | ICD-10-CM | POA: Diagnosis present

## 2015-03-16 DIAGNOSIS — Z791 Long term (current) use of non-steroidal anti-inflammatories (NSAID): Secondary | ICD-10-CM | POA: Diagnosis not present

## 2015-03-16 DIAGNOSIS — R590 Localized enlarged lymph nodes: Secondary | ICD-10-CM | POA: Diagnosis not present

## 2015-03-16 DIAGNOSIS — Z87891 Personal history of nicotine dependence: Secondary | ICD-10-CM | POA: Diagnosis not present

## 2015-03-16 DIAGNOSIS — R0789 Other chest pain: Secondary | ICD-10-CM | POA: Insufficient documentation

## 2015-03-16 DIAGNOSIS — R591 Generalized enlarged lymph nodes: Secondary | ICD-10-CM

## 2015-03-16 LAB — COMPREHENSIVE METABOLIC PANEL
ALBUMIN: 3.6 g/dL (ref 3.5–5.2)
ALK PHOS: 53 U/L (ref 39–117)
ALT: 26 U/L (ref 0–53)
AST: 69 U/L — AB (ref 0–37)
Anion gap: 9 (ref 5–15)
BILIRUBIN TOTAL: 1 mg/dL (ref 0.3–1.2)
BUN: 18 mg/dL (ref 6–23)
CHLORIDE: 103 mmol/L (ref 96–112)
CO2: 28 mmol/L (ref 19–32)
Calcium: 8.9 mg/dL (ref 8.4–10.5)
Creatinine, Ser: 1.67 mg/dL — ABNORMAL HIGH (ref 0.50–1.35)
GFR calc Af Amer: 53 mL/min — ABNORMAL LOW (ref >90)
GFR calc non Af Amer: 46 mL/min — ABNORMAL LOW (ref >90)
Glucose, Bld: 88 mg/dL (ref 70–99)
POTASSIUM: 4 mmol/L (ref 3.5–5.1)
Sodium: 140 mmol/L (ref 135–145)
Total Protein: 5.9 g/dL — ABNORMAL LOW (ref 6.0–8.3)

## 2015-03-16 LAB — I-STAT CHEM 8, ED
BUN: 28 mg/dL — ABNORMAL HIGH (ref 6–23)
CALCIUM ION: 1.03 mmol/L — AB (ref 1.12–1.23)
CHLORIDE: 102 mmol/L (ref 96–112)
Creatinine, Ser: 1.6 mg/dL — ABNORMAL HIGH (ref 0.50–1.35)
Glucose, Bld: 81 mg/dL (ref 70–99)
HEMATOCRIT: 50 % (ref 39.0–52.0)
Hemoglobin: 17 g/dL (ref 13.0–17.0)
Potassium: 5.7 mmol/L — ABNORMAL HIGH (ref 3.5–5.1)
Sodium: 138 mmol/L (ref 135–145)
TCO2: 25 mmol/L (ref 0–100)

## 2015-03-16 LAB — CBC WITH DIFFERENTIAL/PLATELET
BASOS ABS: 0 10*3/uL (ref 0.0–0.1)
Basophils Relative: 0 % (ref 0–1)
EOS PCT: 2 % (ref 0–5)
Eosinophils Absolute: 0.1 10*3/uL (ref 0.0–0.7)
HEMATOCRIT: 46.2 % (ref 39.0–52.0)
Hemoglobin: 14.5 g/dL (ref 13.0–17.0)
LYMPHS PCT: 29 % (ref 12–46)
Lymphs Abs: 2.1 10*3/uL (ref 0.7–4.0)
MCH: 29.2 pg (ref 26.0–34.0)
MCHC: 31.4 g/dL (ref 30.0–36.0)
MCV: 93 fL (ref 78.0–100.0)
MONO ABS: 0.6 10*3/uL (ref 0.1–1.0)
Monocytes Relative: 9 % (ref 3–12)
Neutro Abs: 4.3 10*3/uL (ref 1.7–7.7)
Neutrophils Relative %: 60 % (ref 43–77)
Platelets: 189 10*3/uL (ref 150–400)
RBC: 4.97 MIL/uL (ref 4.22–5.81)
RDW: 15.8 % — AB (ref 11.5–15.5)
WBC: 7.1 10*3/uL (ref 4.0–10.5)

## 2015-03-16 LAB — I-STAT CG4 LACTIC ACID, ED: LACTIC ACID, VENOUS: 1.21 mmol/L (ref 0.5–2.0)

## 2015-03-16 MED ORDER — HYDROCODONE-ACETAMINOPHEN 5-325 MG PO TABS: 1.0000 | ORAL_TABLET | Freq: Four times a day (QID) | ORAL | Status: DC | PRN

## 2015-03-16 MED ORDER — SODIUM POLYSTYRENE SULFONATE 15 GM/60ML PO SUSP: 15.0000 g | Freq: Once | ORAL | Status: DC

## 2015-03-16 MED ORDER — SODIUM CHLORIDE 0.9 % IV BOLUS (SEPSIS)
1000.0000 mL | Freq: Once | INTRAVENOUS | Status: AC
  Administered 2015-03-16: 1000 mL via INTRAVENOUS

## 2015-03-16 MED ORDER — FUROSEMIDE 10 MG/ML IJ SOLN: 40.0000 mg | Freq: Once | INTRAMUSCULAR | Status: DC

## 2015-03-16 MED ORDER — IOHEXOL 300 MG/ML  SOLN
80.0000 mL | Freq: Once | INTRAMUSCULAR | Status: AC | PRN
  Administered 2015-03-16: 80 mL via INTRAVENOUS

## 2015-03-16 NOTE — Discharge Instructions (Signed)
As discussed, with your new finding of enlarged, inflamed lymph nodes, and is very important that you follow-up with our surgical colleagues for outpatient evaluation, including likely biopsy.  Please call tomorrow for the next available appointment.  Return here for concerning changes in your condition.

## 2015-03-16 NOTE — ED Notes (Signed)
Pt sent here from [facility] for possible mass under left arm

## 2015-03-16 NOTE — ED Provider Notes (Signed)
CSN: [id number]     Arrival date & time 03/16/15  1316 History   First MD Initiated Contact with Patient 03/16/15 1618     Chief Complaint  Patient presents with  . Mass     (Consider location/radiation/quality/duration/timing/severity/associated sxs/prior Treatment) HPI Patient presents with concern of increasing pain focally about the left anterior lateral chest wall, and axilla. Over the past 2 months the patient has developed a mass that is tender to palpation, along with intermittent occasional left upper chest sharp pain. Patient describes new fatigability, and his companion states that the patient is increasingly somnolent. He denies new dyspnea, cough, weight loss or weight gain. He has a history of smoking in the distant past, but is not currently a smoker.  Past Medical History  Diagnosis Date  . Thyroid disease   . Brain injury   . Scoliosis    History reviewed. No pertinent past surgical history. History reviewed. No pertinent family history. History  Substance Use Topics  . Smoking status: Former Research scientist (life sciences)  . Smokeless tobacco: Not on file  . Alcohol Use: Yes     Comment: occ    Review of Systems  Constitutional:       Per HPI, otherwise negative  HENT:       Per HPI, otherwise negative  Respiratory:       Per HPI, otherwise negative  Cardiovascular:       Per HPI, otherwise negative  Gastrointestinal: Negative for vomiting.  Endocrine:       Negative aside from HPI  Genitourinary:       Neg aside from HPI   Musculoskeletal:       Per HPI, otherwise negative  Skin: Positive for color change.  Neurological: Negative for syncope.      Allergies  Review of patient's allergies indicates no known allergies.  Home Medications   Prior to Admission medications   Medication Sig Start Date End Date Taking? Authorizing Provider  Aspirin-Salicylamide-Caffeine (BC HEADACHE POWDER PO) Take 1 packet by mouth 2 (two) times daily as needed (pain). For pain.    Yes Historical Provider, MD  diazepam (VALIUM) 5 MG tablet Take 1 tablet (5 mg total) by mouth every 6 (six) hours as needed (spasms). Patient not taking: Reported on 03/16/2015 09/20/13   Sherwood Gambler, MD  HYDROcodone-acetaminophen (NORCO/VICODIN) 5-325 MG per tablet Take 1 tablet by mouth every 6 (six) hours as needed for moderate pain or severe pain. Patient not taking: Reported on 03/16/2015 01/10/15   Mercedes Camprubi-Soms, PA-C  ibuprofen (ADVIL,MOTRIN) 800 MG tablet Take 1 tablet (800 mg total) by mouth 3 (three) times daily. Patient not taking: Reported on 03/16/2015 09/20/13   Sherwood Gambler, MD   BP 112/79 mmHg  Pulse 65  Temp(Src) 97.3 F (36.3 C) (Oral)  Resp 18  Ht 6' (1.829 m)  Wt 200 lb (90.719 kg)  BMI 27.12 kg/m2  SpO2 99% Physical Exam  Constitutional: He is oriented to person, place, and time. He appears well-developed. No distress.  HENT:  Head: Normocephalic and atraumatic.  Eyes: Conjunctivae and EOM are normal.  Cardiovascular: Normal rate and regular rhythm.   Pulmonary/Chest: Effort normal. No stridor. No respiratory distress.    Abdominal: He exhibits no distension.  Musculoskeletal: He exhibits no edema.  Neurological: He is alert and oriented to person, place, and time.  Skin: Skin is warm and dry.  Psychiatric: He has a normal mood and affect.  Nursing note and vitals reviewed.   ED Course  Procedures (including  critical care time) Labs Review Labs Reviewed  CBC WITH DIFFERENTIAL/PLATELET - Abnormal; Notable for the following:    RDW 15.8 (*)    All other components within normal limits  COMPREHENSIVE METABOLIC PANEL - Abnormal; Notable for the following:    Creatinine, Ser 1.67 (*)    Total Protein 5.9 (*)    AST 69 (*)    GFR calc non Af Amer 46 (*)    GFR calc Af Amer 53 (*)    All other components within normal limits  I-STAT CHEM 8, ED  I-STAT CG4 LACTIC ACID, ED    Imaging Review Ct Chest W Contrast  03/16/2015   CLINICAL DATA:   Left axillary mass, corresponding to adenopathy on prior ultrasound. Fatigue.  EXAM: CT CHEST WITH CONTRAST  TECHNIQUE: Multidetector CT imaging of the chest was performed during intravenous contrast administration.  CONTRAST:  29mL OMNIPAQUE IOHEXOL 300 MG/ML  SOLN  COMPARISON:  Soft tissue ultrasound dated 01/10/2015  FINDINGS: Mediastinum/Nodes: Heart is normal in size. Trace anterior pericardial fluid.  Mild atherosclerotic calcifications of the aortic arch.  No suspicious mediastinal or hilar lymphadenopathy.  Dominant 4.2 x 3.8 cm necrotic/cystic left axillary node (series 3/image 16), previously measuring up to 3.1 cm on ultrasound. Three additional left axillary nodes, as follows:  --1.4 x 1.8 cm (series 3/image 11)  --1.3 x 2.2 cm (series 3/ image 12)  --1.5 x 1.5 cm (series 3/image 14)  Lungs/Pleura: Mild dependent atelectasis in the bilateral lower lobes.  No focal consolidation.  No suspicious pulmonary nodules.  Mild paraseptal emphysematous changes.  No pleural effusion or pneumothorax.  Upper abdomen: Visualized upper abdomen is notable for a small hiatal hernia.  Musculoskeletal: Visualized osseous structures are within normal limits.  IMPRESSION: Progression of left axillary lymphadenopathy, with a dominant 4.2 x 3.8 cm necrotic/cystic left axillary node, previously measuring up to 3.1 cm.  Lymphoproliferative disorder is not excluded.  Consider percutaneous biopsy and/or surgical excision for tissue diagnosis.   Electronically Signed   By: Julian Hy M.D.   On: 03/16/2015 18:53     EKG Interpretation   Date/Time:  Tuesday March 16 2015 16:56:54 EDT Ventricular Rate:  54 PR Interval:  187 QRS Duration: 98 QT Interval:  430 QTC Calculation: 407 R Axis:   84 Text Interpretation:  Sinus rhythm Low voltage, extremity leads Sinus  rhythm Low voltage QRS Abnormal ekg Confirmed by Carmin Muskrat  MD  ([id number]) on 03/16/2015 5:10:50 PM     Pulse oximetry 99% room air normal Cardiac  60 sinus rhythm normal   7:38 PM Patient in no distress. He and his companion state an understanding of all results, the need to follow-up with general surgery as an outpatient for biopsy. MDM   Patient presents with worsening pain, swelling in the left upper chest, axilla. Patient is found have palpable nodule, and CT demonstrates multiple enlarged, necrotic lymph nodes. Patient has no other evidence for ulnar dysfunction, no evidence for ACS, no evidence for hemodynamic instability, or acute/occult infection. Patient will follow up with surgery for outpatient biopsy.     Carmin Muskrat, MD 03/16/15 [phone]

## 2015-03-16 NOTE — ED Notes (Signed)
Patient returned from CT

## 2015-03-16 NOTE — ED Provider Notes (Signed)
CSN: [id number]     Arrival date & time 03/16/15  1114 History   First MD Initiated Contact with Patient 03/16/15 1237     Chief Complaint  Patient presents with  . Mass  . Knee Problem   (Consider location/radiation/quality/duration/timing/severity/associated sxs/prior Treatment) HPI       54 year old male presents complaining of a mass under his left arm. This is been present for months. He was seen in the emergency department for this 2 months ago. Had an ultrasound and 3 large lymph nodes were seen. He was told to follow-up with primary care but he has been unable to establish primary care so he comes back in here today for reevaluation. In addition to the lumps under his arm, he also admits to occasional left arm numbness and swelling and redness of his left arm. Also his wife states that he has been severely fatigued and just lays in bed all the time. No masses or swelling in the contralateral arm or anywhere else. Also he complains of left knee pain for many years. Knee occasionally gives way and is always uncomfortable.  Past Medical History  Diagnosis Date  . Thyroid disease   . Brain injury   . Scoliosis    History reviewed. No pertinent past surgical history. History reviewed. No pertinent family history. History  Substance Use Topics  . Smoking status: Never Smoker   . Smokeless tobacco: Not on file  . Alcohol Use: No    Review of Systems  Constitutional: Positive for fatigue. Negative for unexpected weight change.  Musculoskeletal: Positive for arthralgias (Left knee pain).       Occasional left arm swelling  Skin: Positive for color change (left arm redness).  Neurological: Positive for numbness (left arm).  Hematological: Positive for adenopathy.  All other systems reviewed and are negative.   Allergies  Review of patient's allergies indicates no known allergies.  Home Medications   Prior to Admission medications   Medication Sig Start Date End Date Taking?  Authorizing Provider  Aspirin-Salicylamide-Caffeine (BC HEADACHE POWDER PO) Take 1 packet by mouth 2 (two) times daily as needed (pain). For pain.    Historical Provider, MD  diazepam (VALIUM) 5 MG tablet Take 1 tablet (5 mg total) by mouth every 6 (six) hours as needed (spasms). 09/20/13   Sherwood Gambler, MD  HYDROcodone-acetaminophen (NORCO/VICODIN) 5-325 MG per tablet Take 1 tablet by mouth every 6 (six) hours as needed for moderate pain or severe pain. 01/10/15   Mercedes Camprubi-Soms, PA-C  ibuprofen (ADVIL,MOTRIN) 800 MG tablet Take 1 tablet (800 mg total) by mouth 3 (three) times daily. 09/20/13   Sherwood Gambler, MD   BP 92/70 mmHg  Pulse 65  Temp(Src) 96.9 F (36.1 C) (Oral)  SpO2 93% Physical Exam  Constitutional: He is oriented to person, place, and time. He appears well-developed and well-nourished. He has a sickly appearance. No distress.  HENT:  Head: Normocephalic.  Neck: Full passive range of motion without pain. No Brudzinski's sign and no Kernig's sign noted.  Cardiovascular: Normal rate, regular rhythm and normal heart sounds.   Pulses:      Radial pulses are 2+ on the right side, and 1+ on the left side.  Pulmonary/Chest: Effort normal and breath sounds normal. No respiratory distress.  Abdominal: Soft. He exhibits no mass. There is no tenderness. There is no rebound and no guarding.  Umbilical hernia versus Sister Wynona Dove nodule  Lymphadenopathy:    He has axillary adenopathy.  Left axillary: Lateral (there is an obvious large area of fluctuant swelling that was previously identified as a group of enlarged lymph nodes. Superior to this there is another firm enlarged nontender lymph node) adenopathy present.  Neurological: He is alert and oriented to person, place, and time. Coordination normal.  Skin: Skin is warm and dry. No rash noted. He is not diaphoretic.  Psychiatric: He has a normal mood and affect. Judgment normal.  Nursing note and vitals  reviewed.   ED Course  Procedures (including critical care time) Labs Review Labs Reviewed - No data to display  Imaging Review No results found.   MDM   1. Lymphadenopathy, axillary      I reviewed this patient's ER note from his previous visit. There is some concern for non-Hodgkin's lymphoma, he was told to follow-up with primary care as an outpatient for evaluation. This patient has very limited resources and has been unable to do this. Now today he has intermittent swelling of the left arm as well as diminished pulses in the left arm. The patient is having this evaluated and to be ruled out for any sort of lesion that is obstructing blood flow to the left arm. Also, because of his very limited ability to follow-up as outpatient, he probably needs to just have this fully evaluated in the hospital. He'll be transferred to the emergency department via [place], PA-C 03/16/15 [place], PA-C 03/16/15 1314

## 2015-03-16 NOTE — ED Notes (Signed)
Pt being transferred to the ED via shuttle for possible lymphoma.  Report called to Cecille Rubin, RN First Nurse, ED

## 2015-03-16 NOTE — ED Notes (Signed)
Pt has had a mass under his left underarm for about two months.  He states it gets red and hot.  He had it looked at previously, at the ED.  He also complains of his left knee "buckling" on him.  He has had a problem with the knee for several years, with several falls because of it, his latest being last week.

## 2015-03-21 ENCOUNTER — Emergency Department (HOSPITAL_COMMUNITY)
Admission: EM | Admit: 2015-03-21 | Discharge: 2015-03-21 | Disposition: A | Discharge status: Home / Self Care | Payer: Medicaid Other | Attending: Emergency Medicine | Admitting: Emergency Medicine

## 2015-03-21 ENCOUNTER — Encounter (HOSPITAL_COMMUNITY): Payer: Self-pay | Admitting: Emergency Medicine

## 2015-03-21 ENCOUNTER — Telehealth: Payer: Self-pay | Admitting: Surgery

## 2015-03-21 DIAGNOSIS — Z87820 Personal history of traumatic brain injury: Secondary | ICD-10-CM | POA: Insufficient documentation

## 2015-03-21 DIAGNOSIS — M419 Scoliosis, unspecified: Secondary | ICD-10-CM | POA: Insufficient documentation

## 2015-03-21 DIAGNOSIS — Z8639 Personal history of other endocrine, nutritional and metabolic disease: Secondary | ICD-10-CM | POA: Diagnosis not present

## 2015-03-21 DIAGNOSIS — R590 Localized enlarged lymph nodes: Secondary | ICD-10-CM | POA: Diagnosis not present

## 2015-03-21 DIAGNOSIS — Z791 Long term (current) use of non-steroidal anti-inflammatories (NSAID): Secondary | ICD-10-CM | POA: Diagnosis not present

## 2015-03-21 DIAGNOSIS — Z87891 Personal history of nicotine dependence: Secondary | ICD-10-CM | POA: Diagnosis not present

## 2015-03-21 NOTE — Telephone Encounter (Signed)
Samuel Luna  1961/01/17 [id number]  Patient Care Team: Billy Fischer, MD as PCP - General (Family Medicine) Dillon Bjork, NP as Nurse Practitioner (Nurse Practitioner)  This patient is a 54 y.o.male   Reason for call: Persistent masses in axilla.  Concern for lymphadenopathy.  Called by Dr. Stark Jock at [facility] ED emergency department.  Patient keeps returning for painful /  swollen nodules in axilla.  Not consistent with hidradenitis or an abscess.  Not particular dysfunction 1.  Suspicious for lymphadenopathy.  His been recommended he consider getting a biopsy.  Patient has had some insurance issues.  Does not see doctors.  Has not established with primary care physician.  Prefers to go to emergency room to see if the problem can be solved.    I have to do emergency surgery now & am not available to do the surgery on this patient urgent on Sunday night call.  Dr. Stark Jock was mainly asking for help in getting surgery to see the patient.  I noted we would help get the patient in to for biopsy.  We have never seen him before.  Most likely this will need to be in the operating room under general anesthesia for proper dissection and hemostasis.  Ultimately, this patient needs a primary care physician to help with guidance on who will follow up on the results and help manage this as lymphomas are not a surgical issue. ?Dr Juventino Slovak?  Marva Panda?  Dr. Stark Jock is going to try and discuss with the patient and especially his wife.   Patient Active Problem List   Diagnosis Date Noted  . HYPOTHYROIDISM 05/13/2008  . HYPERCHOLESTEROLEMIA 05/13/2008  . MILD COGNITIVE IMPAIRMENT SO STATED 05/12/2008  . KNEE PAIN, LEFT 05/12/2008  . SCOLIOSIS 05/12/2008  . SKIN RASH 05/12/2008    Past Medical History  Diagnosis Date  . Thyroid disease   . Brain injury   . Scoliosis     No past surgical history on file.  History   Social History  . Marital Status: Married    Spouse Name: N/A  . Number of Children: N/A  .  Years of Education: N/A   Occupational History  . Not on file.   Social History Main Topics  . Smoking status: Former Research scientist (life sciences)  . Smokeless tobacco: Not on file  . Alcohol Use: Yes     Comment: occ  . Drug Use: No  . Sexual Activity: Not on file   Other Topics Concern  . Not on file   Social History Narrative    No family history on file.  Current Outpatient Prescriptions  Medication Sig Dispense Refill  . diazepam (VALIUM) 5 MG tablet Take 1 tablet (5 mg total) by mouth every 6 (six) hours as needed (spasms). (Patient not taking: Reported on 03/16/2015) 10 tablet 0  . HYDROcodone-acetaminophen (NORCO/VICODIN) 5-325 MG per tablet Take 1 tablet by mouth every 6 (six) hours as needed for moderate pain or severe pain. 15 tablet 0  . ibuprofen (ADVIL,MOTRIN) 800 MG tablet Take 1 tablet (800 mg total) by mouth 3 (three) times daily. (Patient not taking: Reported on 03/16/2015) 21 tablet 0   No current facility-administered medications for this visit.     No Known Allergies  @VS @  Ct Chest W Contrast  03/16/2015   CLINICAL DATA:  Left axillary mass, corresponding to adenopathy on prior ultrasound. Fatigue.  EXAM: CT CHEST WITH CONTRAST  TECHNIQUE: Multidetector CT imaging of the chest was performed during intravenous contrast administration.  CONTRAST:  19mL OMNIPAQUE IOHEXOL 300 MG/ML  SOLN  COMPARISON:  Soft tissue ultrasound dated 01/10/2015  FINDINGS: Mediastinum/Nodes: Heart is normal in size. Trace anterior pericardial fluid.  Mild atherosclerotic calcifications of the aortic arch.  No suspicious mediastinal or hilar lymphadenopathy.  Dominant 4.2 x 3.8 cm necrotic/cystic left axillary node (series 3/image 16), previously measuring up to 3.1 cm on ultrasound. Three additional left axillary nodes, as follows:  --1.4 x 1.8 cm (series 3/image 11)  --1.3 x 2.2 cm (series 3/ image 12)  --1.5 x 1.5 cm (series 3/image 14)  Lungs/Pleura: Mild dependent atelectasis in the bilateral lower lobes.   No focal consolidation.  No suspicious pulmonary nodules.  Mild paraseptal emphysematous changes.  No pleural effusion or pneumothorax.  Upper abdomen: Visualized upper abdomen is notable for a small hiatal hernia.  Musculoskeletal: Visualized osseous structures are within normal limits.  IMPRESSION: Progression of left axillary lymphadenopathy, with a dominant 4.2 x 3.8 cm necrotic/cystic left axillary node, previously measuring up to 3.1 cm.  Lymphoproliferative disorder is not excluded.  Consider percutaneous biopsy and/or surgical excision for tissue diagnosis.   Electronically Signed   By: Julian Hy M.D.   On: 03/16/2015 18:53    Note: This dictation was prepared with Dragon/digital dictation along with [employer]. Any transcriptional errors that result from this process are unintentional.

## 2015-03-21 NOTE — ED Provider Notes (Signed)
CSN: [id number]     Arrival date & time 03/21/15  1841 History   First MD Initiated Contact with Patient 03/21/15 1927     Chief Complaint  Patient presents with  . Cyst     (Consider location/radiation/quality/duration/timing/severity/associated sxs/prior Treatment) HPI Comments: Patient is a 54 year old male with no significant past medical history. He was recently diagnosed with a necrotic lymph node in the left axilla. He was discharged and advised to follow-up with general surgery. His attempted to make this appointment, however the Medicaid he is enrolled in is requiring some form of referral which she has not gotten to this point. He denies fevers or chills. He denies any significant change in his condition since his prior ER visit.  The history is provided by the patient.    Past Medical History  Diagnosis Date  . Thyroid disease   . Brain injury   . Scoliosis    History reviewed. No pertinent past surgical history. No family history on file. History  Substance Use Topics  . Smoking status: Former Research scientist (life sciences)  . Smokeless tobacco: Not on file  . Alcohol Use: Yes     Comment: occ    Review of Systems  All other systems reviewed and are negative.     Allergies  Review of patient's allergies indicates no known allergies.  Home Medications   Prior to Admission medications   Medication Sig Start Date End Date Taking? Authorizing Provider  Aspirin-Salicylamide-Caffeine (BC HEADACHE POWDER PO) Take 1 packet by mouth 2 (two) times daily as needed (pain). For pain.    Historical Provider, MD  diazepam (VALIUM) 5 MG tablet Take 1 tablet (5 mg total) by mouth every 6 (six) hours as needed (spasms). Patient not taking: Reported on 03/16/2015 09/20/13   Sherwood Gambler, MD  HYDROcodone-acetaminophen (NORCO/VICODIN) 5-325 MG per tablet Take 1 tablet by mouth every 6 (six) hours as needed for moderate pain or severe pain. 03/16/15   Carmin Muskrat, MD  ibuprofen (ADVIL,MOTRIN) 800  MG tablet Take 1 tablet (800 mg total) by mouth 3 (three) times daily. Patient not taking: Reported on 03/16/2015 09/20/13   Sherwood Gambler, MD   BP 120/80 mmHg  Pulse 71  Temp(Src) 98.1 F (36.7 C) (Oral)  Resp 20  SpO2 97% Physical Exam  Constitutional: He is oriented to person, place, and time. He appears well-developed and well-nourished. No distress.  HENT:  Head: Normocephalic and atraumatic.  Neck: Normal range of motion. Neck supple.  Cardiovascular: Normal rate, regular rhythm and normal heart sounds.   No murmur heard. Pulmonary/Chest: Effort normal and breath sounds normal. No respiratory distress. He has no wheezes.  There is a swollen area in the left axilla/left lateral upper chest. There is no overlying inflammation or erythema.  Abdominal: Soft. Bowel sounds are normal. He exhibits no distension. There is no tenderness.  Musculoskeletal: Normal range of motion. He exhibits no edema.  Neurological: He is alert and oriented to person, place, and time.  Skin: Skin is warm and dry. He is not diaphoretic.  Nursing note and vitals reviewed.   ED Course  Procedures (including critical care time) Labs Review Labs Reviewed - No data to display  Imaging Review No results found.   EKG Interpretation None      MDM   Final diagnoses:  None    Patient presents with complaints of swelling to his left axilla that CT scan has revealed to be a necrotic lymph node, possible lymphoproliferative disease. Recommendations are for a biopsy  however this has not happened related to insurance reasons. I've spoken with Dr. gross from general surgery who is recommending calling the office in the morning and arranging an appointment with them in the next few days.    Veryl Speak, MD 03/21/15 2011

## 2015-03-21 NOTE — Discharge Instructions (Signed)
Please establish with primary care physician to help with your medical needs.  Consider Samuel Luna or Samuel Luna that have seen you in pthe past.  He would benefit from biopsy of your axillary / armpit masses.  [facility] can help do this as long as there is a primary care doctor to help guide you through this process.  Lymphadenopathy Lymphadenopathy means "disease of the lymph glands." But the term is usually used to describe swollen or enlarged lymph glands, also called lymph nodes. These are the bean-shaped organs found in many locations including the neck, underarm, and groin. Lymph glands are part of the immune system, which fights infections in your body. Lymphadenopathy can occur in just one area of the body, such as the neck, or it can be generalized, with lymph node enlargement in several areas. The nodes found in the neck are the most common sites of lymphadenopathy. CAUSES When your immune system responds to germs (such as viruses or bacteria ), infection-fighting cells and fluid build up. This causes the glands to grow in size. Usually, this is not something to worry about. Sometimes, the glands themselves can become infected and inflamed. This is called lymphadenitis. Enlarged lymph nodes can be caused by many diseases:  Bacterial disease, such as strep throat or a skin infection.  Viral disease, such as a common cold.  Other germs, such as Lyme disease, tuberculosis, or sexually transmitted diseases.  Cancers, such as lymphoma (cancer of the lymphatic system) or leukemia (cancer of the white blood cells).  Inflammatory diseases such as lupus or rheumatoid arthritis.  Reactions to medications. Many of the diseases above are rare, but important. This is why you should see your caregiver if you have lymphadenopathy. SYMPTOMS  Swollen, enlarged lumps in the neck, back of the head, or other locations.  Tenderness.  Warmth or redness of the skin over the  lymph nodes.  Fever. DIAGNOSIS Enlarged lymph nodes are often near the source of infection. They can help health care providers diagnose your illness. For instance:  Swollen lymph nodes around the jaw might be caused by an infection in the mouth.  Enlarged glands in the neck often signal a throat infection.  Lymph nodes that are swollen in more than one area often indicate an illness caused by a virus. Your caregiver will likely know what is causing your lymphadenopathy after listening to your history and examining you. Blood tests, x-rays, or other tests may be needed. If the cause of the enlarged lymph node cannot be found, and it does not go away by itself, then a biopsy may be needed. Your caregiver will discuss this with you. TREATMENT Treatment for your enlarged lymph nodes will depend on the cause. Many times the nodes will shrink to normal size by themselves, with no treatment. Antibiotics or other medicines may be needed for infection. Only take over-the-counter or prescription medicines for pain, discomfort, or fever as directed by your caregiver. HOME CARE INSTRUCTIONS Swollen lymph glands usually return to normal when the underlying medical condition goes away. If they persist, contact your health-care provider. He/she might prescribe antibiotics or other treatments, depending on the diagnosis. Take any medications exactly as prescribed. Keep any follow-up appointments made to check on the condition of your enlarged nodes. SEEK MEDICAL CARE IF:  Swelling lasts for more than two weeks.  You have symptoms such as weight loss, night sweats, fatigue, or fever that does not go away.  The lymph nodes are hard, seem fixed  to the skin, or are growing rapidly.  Skin over the lymph nodes is red and inflamed. This could mean there is an infection. SEEK IMMEDIATE MEDICAL CARE IF:  Fluid starts leaking from the area of the enlarged lymph node.  You develop a fever of 102 F (38.9 C) or  greater.  Severe pain develops (not necessarily at the site of a large lymph node).  You develop chest pain or shortness of breath.  You develop worsening abdominal pain. MAKE SURE YOU:  Understand these instructions.  Will watch your condition.  Will get help right away if you are not doing well or get worse. Document Released: 08/29/2008 Document Revised: 04/06/2014 Document Reviewed: 08/29/2008 [facility] Patient Information 2015 [place], [place]. This information is not intended to replace advice given to you by your health care provider. Make sure you discuss any questions you have with your health care provider.  Managing Pain  Pain after surgery or related to activity is often due to strain/injury to muscle, tendon, nerves and/or incisions.  This pain is usually short-term and will improve in a few months.   Many people find it helpful to do the following things TOGETHER to help speed the process of healing and to get back to regular activity more quickly:  1. Avoid heavy physical activity at first a. No lifting greater than 20 pounds at first, then increase to lifting as tolerated over the next few weeks b. Do not push through the pain.  Listen to your body and avoid positions and maneuvers than reproduce the pain.  Wait a few days before trying something more intense c. Walking is okay as tolerated, but go slowly and stop when getting sore.  If you can walk 30 minutes without stopping or pain, you can try more intense activity (running, jogging, aerobics, cycling, swimming, treadmill, sex, sports, weightlifting, etc ) d. Remember: If it hurts to do it, then dont do it!  2. Take Anti-inflammatory medication a. Choose ONE of the following over-the-counter medications: i.            Acetaminophen 500mg  tabs (Tylenol) 1-2 pills with every meal and just before bedtime (avoid if you have liver problems) ii.            Naproxen 220mg  tabs (ex. Aleve) 1-2 pills twice a day (avoid if  you have kidney, stomach, IBD, or bleeding problems) iii. Ibuprofen 200mg  tabs (ex. Advil, Motrin) 3-4 pills with every meal and just before bedtime (avoid if you have kidney, stomach, IBD, or bleeding problems) b. Take with food/snack around the clock for 1-2 weeks i. This helps the muscle and nerve tissues become less irritable and calm down faster  3. Use a Heating pad or Ice/Cold Pack a. 4-6 times a day b. May use warm bath/hottub  or showers  4. Try Gentle Massage and/or Stretching  a. at the area of pain many times a day b. stop if you feel pain - do not overdo it  Try these steps together to help you body heal faster and avoid making things get worse.  Doing just one of these things may not be enough.    If you are not getting better after two weeks or are noticing you are getting worse, contact your primary care doctor for further advice; they may need to re-evaluate you & see what other things we can do to help.     Emergency Department Resource Guide 1) Find a Doctor and Pay Out of Pocket Although you  won't have to find out who is covered by your insurance plan, it is a good idea to ask around and get recommendations. You will then need to call the office and see if the doctor you have chosen will accept you as a new patient and what types of options they offer for patients who are self-pay. Some doctors offer discounts or will set up payment plans for their patients who do not have insurance, but you will need to ask so you aren't surprised when you get to your appointment.  2) Contact Your Local Health Department Not all health departments have doctors that can see patients for sick visits, but many do, so it is worth a call to see if yours does. If you don't know where your local health department is, you can check in your phone book. The CDC also has a tool to help you locate your state's health department, and many state websites also have listings of all of their local health  departments.  3) Find a [facility] [place] [facility] If your illness is not likely to be very severe or complicated, you may want to try a walk in clinic. These are popping up all over the country in pharmacies, drugstores, and shopping centers. They're usually staffed by nurse practitioners or physician assistants that have been trained to treat common illnesses and complaints. They're usually fairly quick and inexpensive. However, if you have serious medical issues or chronic medical problems, these are probably not your best option.  No Primary Care Doctor: - Call Health Connect at  [phone] - they can help you locate a primary care doctor that  accepts your insurance, provides certain services, etc. - Physician Referral Service- [phone]  Chronic Pain Problems: Organization         Address  Phone   Notes  [facility]  [phone] Patients need to be referred by their primary care doctor.   Medication Assistance: Organization         Address  Phone   Notes  [facility] Medication [facility] [address] [place], [place] [address] [phone] --Must be a resident of [facility] -- Must have NO insurance coverage whatsoever (no Medicaid/ Medicare, etc.) -- The pt. MUST have a primary care doctor that directs their care regularly and follows them in the community   MedAssist  [phone]   [employer]  [phone]    Agencies that provide inexpensive medical care: Organization         Address  Phone   Notes  [place]  [phone]   [facility]    [phone]   [facility] [place], [place] [address] [phone]   [place] [address] [place] [phone]   Planned Parenthood    [phone]   [facility]    [phone]   [place] [address], [place] Phone:  [phone], Fax:  [phone] Hours of Operation:  9 am - 6 pm, M-F.  Also accepts Medicaid/Medicare and self-pay.  [facility] for [place], [address], [place] Phone: [phone], Fax: [phone]. Hours of Operation:  8:30 am - 5:30 pm, M-F.  Also accepts Medicaid and self-pay.  [facility] [address] [employer] Phone: [phone]   [place]  [address] [place], [place] [phone], Ext. [phone] Mondays & Thursdays: 7-9 AM.  First 15 patients are seen on a first come, first serve basis.    [place] Providers:  Organization         Address  Phone   Notes  [facility] [address], [place] [phone] Also accepts self-pay patients.  [facility] [address], [place]  [phone]   [place], [address], [place] [phone]   [facility] [address] [place] [phone]   Lucianne Lei [address], [place]   [phone] Only accepts [place] Access [place] patients after they have their name applied to their card.   Self-Pay (no insurance) in [facility]:  Organization         Address  Phone   Notes  Sickle Cell Patients, [facility] [place] [phone]   [facility] [place] [phone]   [facility] [place]  [place], [place], [place] [phone]   [facility]/Dr. Osei-Bonsu  [address], [place] or [address], [place] [phone] Phone number for both [place] and [place] locations is the same.  [facility] [address], [place] [phone]   [facility] [address], [place] or [address] [phone]   [facility] [address], [place] [phone], phone; [phone], fax Sees patients 1st and 3rd Saturday of every month.  Must not qualify for public or private insurance (i.e. Medicaid, Medicare, [place] Health Choice, Veterans' Benefits)  Household income should be no more than 200% of the poverty level The clinic cannot treat you if you are pregnant or think you are pregnant  Sexually transmitted diseases are not treated at the clinic.    Dental Care: Organization         Address  Phone  Notes  [facility] Department of [facility] [place] [phone] Accepts children up to age 50 who are enrolled in [place] or [place]; pregnant women with a Medicaid card; and children who have applied for Medicaid or [place] Health Choice, but were declined, whose parents can pay a reduced fee at time of service.  [facility] Department of [facility]  [address], [place] [phone] Accepts children up to age 27 who are enrolled in [place] or [place]; pregnant women with a Medicaid card; and children who have applied for Medicaid or [place] Health Choice, but were declined, whose parents can pay a reduced fee at time of service.  [facility] Access [facility]  [place] [phone] Patients are seen by appointment only. Walk-ins are not accepted. [place] will see patients 69 years of age and older. Monday - Tuesday (8am-5pm) Most Wednesdays (8:30-5pm) $30 per visit, cash only  [facility] Adult Dental Access PROGRAM  [address], [facility] [phone] Patients are seen by appointment only. Walk-ins are not accepted. [place] will see patients 89 years of age and older. One Wednesday Evening (Monthly: Volunteer Based).  $30 per visit, cash only  [employer] of  Dentistry Clinics  [phone] for adults;  Children under age 26, call Graduate Pediatric Dentistry at [phone]. Children aged 73-14, please call [phone] to request a pediatric application.  Dental services are provided in all areas of dental care including fillings, crowns and bridges, complete and partial dentures, implants, gum treatment, root canals, and extractions. Preventive care is also provided. Treatment is provided to both adults and children. Patients are selected via a lottery and there is often a waiting list.   [facility] [address] [place]  [phone] [email or web address]   [facility] [address] [place], [place] [phone], Ext. [phone] Second and Fourth Thursday of each month, opens at 6:30 AM; Clinic ends at 9 AM.  Patients are seen on a first-come first-served basis, and a limited number are seen during each clinic.   [facility]  [address] [place], [place] [phone]   Eligibility Requirements You must have lived in [place], [place], or [place] counties for at least the last three months.   You cannot be eligible for state or federal sponsored [employer], including [employer], [place], or [employer].   You generally cannot be eligible for healthcare insurance through your employer.    How to apply: Eligibility screenings are held every Tuesday and Wednesday afternoon from 1:00 pm until 4:00 pm. You do not need an appointment for the interview!  [facility] [address], [place], [place]   [place]  [place] Department  [place]  [phone]    Behavioral Health Resources in the Community: Intensive Outpatient Programs Organization         Address  Phone  Notes  [place]. [address] [place], [place] [phone]   [facility] Outpatient [address], [place], [place]   ADS: Alcohol & Drug Svcs [address] [place], [place]   [place] [address]  [place], [place] or [phone]   Substance Abuse Resources Organization         Address  Phone  Notes  Alcohol and Drug Services  [phone]   [place]  [phone]   [facility] [place]   Chinita Pester  [phone]   Residential & Outpatient Substance Abuse Program  [phone]   Psychological Services Organization         Address  Phone  Notes  [facility]  [place]  [phone]   [place] [address] [place] or [phone]    Mobile Crisis Teams Organization         Address  Phone  Notes  [facility]rapeutic Alternatives, Mobile Crisis Care Unit  [phone]   [facility]  [address] [place], [place]   [place] [address], [place] [phone]    Self-Help/Support Groups Organization         Address  Phone             Notes  [place]. of [place] - variety of support groups  [place] Call for more information  Narcotics Anonymous (NA), [facility] [address], [employer] [place]  2 meetings at this location   [employer]  Phone  Notes  [facility] [address],    [place]  [phone]   [facility]  [address] [place] [address]3785, [place], [place]   [place], [place] [phone] Admissions: 8am-3pm M-F  Incentives Substance [place] [address]    [place], [place] [phone]   [facility] [address] [place], [place], [place]   [facility] [address]  [place], [place]   [facility]ight Programs - Intensive  Outpatient [address]een [address], [place], [place]   [facility] ([place]  [place], [place] [phone] or [phone]   [facility] (RTS) [address], [place], [place] Accepts Medicaid  [facility] [place] [address]  [employer] [place] [phone] Substance Abuse/Addiction Treatment   [facility] Organization         Address  Phone  Notes  [facility]  [phone]   Domenic Schwab, PhD [address] [place], [place]   [phone] or [phone]   [place], [place] [phone]   [facility] [address] [place], [place] [phone] Insurance/Medicaid/sponsorship through [facility] and Families [address]                                    [place], [place] [phone] [place] [address], [place] [phone]    Dr. Adele Schilder  [phone]   [facility] Dept. 1) [address] [place] 2) [place] 3)  [place] [address], [place] [phone]  [phone]   [place] [phone] or [phone] (After Hours)

## 2015-03-21 NOTE — ED Notes (Signed)
Pt c/o knot on L lateral chest, close to L armpit. Pt has been seen multiple times for the same. Pt sts that he runs fevers off and on, and that every now and then his L arm turns blue. Pt does not have fever at this time and arm does not appear blue.  Pt sts "I know they can't do anything for me, but I need a referral so I can get a biopsy." Per family, family has made multiple attempts for follow up without success. A&Ox4 and ambulatory. Pt able to move L arm without difficulty. "knot" under armpit appears soft, pt c/o tenderness to palpation.

## 2015-04-02 ENCOUNTER — Other Ambulatory Visit: Payer: Self-pay | Admitting: Surgery

## 2015-04-02 NOTE — H&P (Signed)
Samuel Luna 04/02/2015 9:57 AM Location: [facility] Patient #: [id number] DOB: 06-21-1961 Married / Language: Samuel Luna / Race: White Male History of Present Illness Adin Hector MD; 04/02/2015 12:12 PM) Patient words: Lump in left armpits at least 2 months.  The patient is a 54 year old male who presents with a complaint of lumps. Patient sent by Dr. Veryl Speak with [facility] health emergency Department for persistent axillary masses suspicion for lymphadenopathy.  Patient comes today with his wife. He noticed mass in LEFT armpit in late January. Increased in size. He's gone to the emergency room 3 times with concerns. Does not have a consistent PCP. Ultrasound done in February. CT scan of chest done in April. Axillary masses suspicious for lymph nodes noted. Largest one has gotten larger. Suspicious for necrotic lymph nodes. Surgical consultation had been requested. Recommended following up in office to consider biopsy but also to establish with primary care physician. Patient missed his first appointment. Patient notes he's felt a lump in the LEFT armpit. Obvious swelling. It is gradually gotten larger. Had some tenderness to up and not severe. Occasion radiation of numbness down to the hand. No weakness. His weight has been stable. No major fevers. Occasionally has some sweats at night but it is mild and has been going on for years. Does feel more tired and sluggish. He does not feel any lumps or bumps anywhere else. No history of lymphoma or leukemia. No history of fall. No history of scratches or sores. No history of trauma.                   CLINICAL DATA: Left axillary mass, corresponding to adenopathy on prior ultrasound. Fatigue.  EXAM: CT CHEST WITH CONTRAST  TECHNIQUE: Multidetector CT imaging of the chest was performed during intravenous contrast administration.  CONTRAST: 30mL OMNIPAQUE IOHEXOL 300 MG/ML  SOLN  COMPARISON: Soft tissue ultrasound dated 01/10/2015  FINDINGS: Mediastinum/Nodes: Heart is normal in size. Trace anterior pericardial fluid.  Mild atherosclerotic calcifications of the aortic arch.  No suspicious mediastinal or hilar lymphadenopathy.  Dominant 4.2 x 3.8 cm necrotic/cystic left axillary node (series 3/image 16), previously measuring up to 3.1 cm on ultrasound. Three additional left axillary nodes, as follows:  --1.4 x 1.8 cm (series 3/image 11)  --1.3 x 2.2 cm (series 3/ image 12)  --1.5 x 1.5 cm (series 3/image 14)  Lungs/Pleura: Mild dependent atelectasis in the bilateral lower lobes.  No focal consolidation.  No suspicious pulmonary nodules.  Mild paraseptal emphysematous changes.  No pleural effusion or pneumothorax.  Upper abdomen: Visualized upper abdomen is notable for a small hiatal hernia.  Musculoskeletal: Visualized osseous structures are within normal limits.  IMPRESSION: Progression of left axillary lymphadenopathy, with a dominant 4.2 x 3.8 cm necrotic/cystic left axillary node, previously measuring up to 3.1 cm.  Lymphoproliferative disorder is not excluded.  Consider percutaneous biopsy and/or surgical excision for tissue diagnosis.   Electronically Signed By: Julian Hy M.D. On: 03/16/2015 18:53               Study Result CLINICAL DATA: Left axillary lump/swelling x 1.5 weeks, fever EXAM: CHEST ULTRASOUND COMPARISON: None. FINDINGS: Palpable abnormality corresponds to multiple enlarged lymph nodes in the left axilla, as follows: --2.7 x 2.0 x 2.7 cm, with vascularity, with preservation of the fatty hilum --3.1 x 2.6 x 3.0 cm, without vascularity, with preservation of the fatty hilum --3.0 x 1.6 x 2.3 cm, with vascularity, with replacement of the fatty hilum  IMPRESSION: Palpable abnormality corresponds to multiple enlarged lymph nodes in the left axilla, measuring up to 2.6 cm short  axis, as above. Given the abnormal appearance, unless these are clinically thought to be infectious, percutaneous sampling is suggested. Electronically Signed By: Julian Hy M.D. On: 01/10/2015 14:48   Past Surgical History Samuel Luna, CMA; 04/02/2015 9:58 AM) No pertinent past surgical history  Allergies Samuel Luna, CMA; 04/02/2015 9:58 AM) No Known Drug Allergies 04/02/2015  Medication History Samuel Luna, CMA; 04/02/2015 9:59 AM) Hydrocodone-Acetaminophen (5-325MG  Tablet, Oral) Active. Valium (5MG  Tablet, Oral) Active. Ibuprofen (800MG  Tablet, Oral) Active. Medications Reconciled  Social History Samuel Luna, [place]; 04/02/2015 9:58 AM) Tobacco use Former smoker.     Review of Systems Samuel Luna CMA; 04/02/2015 9:58 AM) General Present- Fatigue. Not Present- Appetite Loss, Chills, Fever, Night Sweats, Weight Gain and Weight Loss. Skin Present- Dryness. Not Present- Change in Wart/Mole, Hives, Jaundice, New Lesions, Non-Healing Wounds, Rash and Ulcer. HEENT Present- Visual Disturbances. Not Present- Earache, Hearing Loss, Hoarseness, Nose Bleed, Oral Ulcers, Ringing in the Ears, Seasonal Allergies, Sinus Pain, Sore Throat, Wears glasses/contact lenses and Yellow Eyes. Respiratory Not Present- Bloody sputum, Chronic Cough, Difficulty Breathing, Snoring and Wheezing. Breast Present- Breast Mass. Not Present- Breast Pain, Nipple Discharge and Skin Changes. Musculoskeletal Present- Back Pain. Not Present- Joint Pain, Joint Stiffness, Muscle Pain, Muscle Weakness and Swelling of Extremities. Neurological Present- Numbness. Not Present- Decreased Memory, Fainting, Headaches, Seizures, Tingling, Tremor, Trouble walking and Weakness.  Vitals Samuel Luna CMA; 04/02/2015 10:00 AM) 04/02/2015 9:59 AM Weight: 205 lb Height: 72in Body Surface Area: 2.17 m Body Mass Index: 27.8 kg/m Temp.: 97.23F(Oral)  Pulse: 80 (Regular)  Resp.: 17 (Unlabored)  BP:  143/72 (Sitting, Left Arm, Standard)     Physical Exam Adin Hector MD; 04/02/2015 10:29 AM)  General Mental Status-Alert. General Appearance-Not in acute distress, Not Sickly. Orientation-Oriented X3. Hydration-Well hydrated. Voice-Normal.  Integumentary Global Assessment Upon inspection and palpation of skin surfaces of the - Axillae: non-tender, no inflammation or ulceration, no drainage. and Distribution of scalp and body hair is normal. General Characteristics Temperature - normal warmth is noted.  Head and Neck Head-normocephalic, atraumatic with no lesions or palpable masses. Face Global Assessment - atraumatic, no absence of expression. Neck Global Assessment - no abnormal movements, no bruit auscultated on the right, no bruit auscultated on the left, no decreased range of motion, non-tender. Trachea-midline. Thyroid Gland Characteristics - non-tender.  Eye Eyeball - Left-Extraocular movements intact, No Nystagmus. Eyeball - Right-Extraocular movements intact, No Nystagmus. Cornea - Left-No Hazy. Cornea - Right-No Hazy. Sclera/Conjunctiva - Left-No scleral icterus, No Discharge. Sclera/Conjunctiva - Right-No scleral icterus, No Discharge. Pupil - Left-Direct reaction to light normal. Pupil - Right-Direct reaction to light normal.  ENMT Ears Pinna - Left - no drainage observed, no generalized tenderness observed. Right - no drainage observed, no generalized tenderness observed. Nose and Sinuses External Inspection of the Nose - no destructive lesion observed. Inspection of the nares - Left - quiet respiration. Right - quiet respiration. Mouth and Throat Lips - Upper Lip - no fissures observed, no pallor noted. Lower Lip - no fissures observed, no pallor noted. Nasopharynx - no discharge present. Oral Cavity/Oropharynx - Tongue - no dryness observed. Oral Mucosa - no cyanosis observed. Hypopharynx - no evidence of airway distress  observed.  Chest and Lung Exam Inspection Movements - Normal and Symmetrical. Accessory muscles - No use of accessory muscles in breathing. Palpation Palpation of the chest reveals - Non-tender. Auscultation Breath sounds -  Normal and Clear. Note: No breast area will masses. No nipple discharge. No peau d'orange. No discrete masses.  Obvious mass and LEFT anterior axilla near pectoralis. 4 cm. Ellipsoid. Not inflamed or fluctuant. Nontender. 2 centimeter nodule felt deeper in the mid axilla. Most likely consistent with abnormal lipoma or lymphadenopathy.   Cardiovascular Auscultation Rhythm - Regular. Murmurs & Other Heart Sounds - Auscultation of the heart reveals - No Murmurs and No Systolic Clicks.  Abdomen Inspection Inspection of the abdomen reveals - No Visible peristalsis and No Abnormal pulsations. Umbilicus - No Bleeding, No Urine drainage. Palpation/Percussion Palpation and Percussion of the abdomen reveal - Soft, Non Tender, No Rebound tenderness, No Rigidity (guarding) and No Cutaneous hyperesthesia.  Male Genitourinary Sexual Maturity Tanner 5 - Adult hair pattern and Adult penile size and shape.  Peripheral Vascular Upper Extremity Inspection - Left - No Cyanotic nailbeds, Not Ischemic. Right - No Cyanotic nailbeds, Not Ischemic.  Neurologic Neurologic evaluation reveals -normal attention span and ability to concentrate, able to name objects and repeat phrases. Appropriate fund of knowledge , normal sensation and normal coordination. Mental Status Affect - not angry, not paranoid. Cranial Nerves-Normal Bilaterally. Gait-Normal.  Neuropsychiatric Mental status exam performed with findings of-able to articulate well with normal speech/language, rate, volume and coherence, thought content normal with ability to perform basic computations and apply abstract reasoning and no evidence of hallucinations, delusions, obsessions or homicidal/suicidal  ideation.  Musculoskeletal Global Assessment Spine, Ribs and Pelvis - no instability, subluxation or laxity. Right Upper Extremity - no instability, subluxation or laxity.  Lymphatic Head & Neck  General Head & Neck Lymphatics: Bilateral - Description - No Localized lymphadenopathy. Axillary  General Axillary Region: Bilateral - Description - No Localized lymphadenopathy. Femoral & Inguinal  Generalized Femoral & Inguinal Lymphatics: Left - Description - No Localized lymphadenopathy. Right - Description - No Localized lymphadenopathy.    Assessment & Plan Adin Hector MD; 04/02/2015 12:10 PM)  AXILLARY MASS, LEFT (782.2  R22.32) Impression: For axillary masses on CT of the chest. 2 felt on physical exam. Largest one about 4 x 4 centimeters. Ellipsoid. CT scan concerning for lymphadenopathy. As one could be a lipoma but necrotic lymph node as well. They persistent for several months. Not improved.  I think this warrants biopsy. Because they're probably lymph nodes, this would've prior complete excisional biopsy. Plan outpatient surgery to do this. Perhaps takeout other lymph nodes if they're available but don't want to overdue it. Possible need for a drain as well.  Also, it is essential for him to establish with a primary care physician to help coordinate management and care. They are working to get involved with [place] screening colonoscopy since he is over 45 years old at some point.  Current Plans Schedule for Surgery Pt Education - [facility] (Ly[facility] Souders) Pt Education - Lymph Nodes, Enlarged: enlarged lymph nodes  Adin Hector, M.D., F.A.C.S. Gastrointestinal and Minimally Invasive Surgery [facility] [address] [place], [place] [address] [phone] Main / Paging

## 2015-10-14 ENCOUNTER — Encounter: Payer: Self-pay | Admitting: Gastroenterology

## 2015-12-14 ENCOUNTER — Encounter: Payer: Medicaid Other | Admitting: Gastroenterology

## 2015-12-30 ENCOUNTER — Ambulatory Visit (AMBULATORY_SURGERY_CENTER): Payer: Self-pay

## 2015-12-30 VITALS — Ht 72.0 in | Wt 204.0 lb

## 2015-12-30 DIAGNOSIS — Z1211 Encounter for screening for malignant neoplasm of colon: Secondary | ICD-10-CM

## 2015-12-30 MED ORDER — NA SULFATE-K SULFATE-MG SULF 17.5-3.13-1.6 GM/177ML PO SOLN: 1.0000 | Freq: Once | ORAL | Status: DC

## 2015-12-30 NOTE — Progress Notes (Signed)
No egg or soy allergies Not on home 02 No previous anesthesia complications Emmi video declined. No diet or weight loss meds 

## 2016-01-12 ENCOUNTER — Encounter: Payer: Self-pay | Admitting: Gastroenterology

## 2016-01-12 ENCOUNTER — Telehealth: Payer: Self-pay | Admitting: Gastroenterology

## 2016-01-12 ENCOUNTER — Ambulatory Visit (AMBULATORY_SURGERY_CENTER): Payer: Medicaid Other | Admitting: Gastroenterology

## 2016-01-12 VITALS — BP 137/72 | HR 61 | Temp 96.3°F | Resp 14 | Ht 72.0 in | Wt 204.0 lb

## 2016-01-12 DIAGNOSIS — D124 Benign neoplasm of descending colon: Secondary | ICD-10-CM

## 2016-01-12 DIAGNOSIS — D123 Benign neoplasm of transverse colon: Secondary | ICD-10-CM | POA: Diagnosis not present

## 2016-01-12 DIAGNOSIS — Z1211 Encounter for screening for malignant neoplasm of colon: Secondary | ICD-10-CM | POA: Diagnosis not present

## 2016-01-12 MED ORDER — SODIUM CHLORIDE 0.9 % IV SOLN: 500.0000 mL | INTRAVENOUS | Status: DC

## 2016-01-12 NOTE — Patient Instructions (Signed)
Discharge instructions given. Handout on polyps. Resume previous medications. YOU HAD AN ENDOSCOPIC PROCEDURE TODAY AT THE [facility]:   Refer to the procedure report that was given to you for any specific questions about what was found during the examination.  If the procedure report does not answer your questions, please call your gastroenterologist to clarify.  If you requested that your care partner not be given the details of your procedure findings, then the procedure report has been included in a sealed envelope for you to review at your convenience later.  YOU SHOULD EXPECT: Some feelings of bloating in the abdomen. Passage of more gas than usual.  Walking can help get rid of the air that was put into your GI tract during the procedure and reduce the bloating. If you had a lower endoscopy (such as a colonoscopy or flexible sigmoidoscopy) you may notice spotting of blood in your stool or on the toilet paper. If you underwent a bowel prep for your procedure, you may not have a normal bowel movement for a few days.  Please Note:  You might notice some irritation and congestion in your nose or some drainage.  This is from the oxygen used during your procedure.  There is no need for concern and it should clear up in a day or so.  SYMPTOMS TO REPORT IMMEDIATELY:   Following lower endoscopy (colonoscopy or flexible sigmoidoscopy):  Excessive amounts of blood in the stool  Significant tenderness or worsening of abdominal pains  Swelling of the abdomen that is new, acute  Fever of 100F or higher   For urgent or emergent issues, a gastroenterologist can be reached at any hour by calling [phone].   DIET: Your first meal following the procedure should be a small meal and then it is ok to progress to your normal diet. Heavy or fried foods are harder to digest and may make you feel nauseous or bloated.  Likewise, meals heavy in dairy and vegetables can increase bloating.  Drink  plenty of fluids but you should avoid alcoholic beverages for 24 hours.  ACTIVITY:  You should plan to take it easy for the rest of today and you should NOT DRIVE or use heavy machinery until tomorrow (because of the sedation medicines used during the test).    FOLLOW UP: Our staff will call the number listed on your records the next business day following your procedure to check on you and address any questions or concerns that you may have regarding the information given to you following your procedure. If we do not reach you, we will leave a message.  However, if you are feeling well and you are not experiencing any problems, there is no need to return our call.  We will assume that you have returned to your regular daily activities without incident.  If any biopsies were taken you will be contacted by phone or by letter within the next 1-3 weeks.  Please call us at [phone] if you have not heard about the biopsies in 3 weeks.    SIGNATURES/CONFIDENTIALITY: You and/or your care partner have signed paperwork which will be entered into your electronic medical record.  These signatures attest to the fact that that the information above on your After Visit Summary has been reviewed and is understood.  Full responsibility of the confidentiality of this discharge information lies with you and/or your care-partner. 

## 2016-01-12 NOTE — Progress Notes (Signed)
Stable to RR 

## 2016-01-12 NOTE — Progress Notes (Signed)
No problems noted in the recovery room. maw 

## 2016-01-12 NOTE — Op Note (Signed)
[place]  [address] [place], [id number]   COLONOSCOPY PROCEDURE REPORT  PATIENT: Samuel Luna, Samuel Luna  MR#: [id number] BIRTHDATE: 05/07/1961 , 4  yrs. old GENDER: male ENDOSCOPIST: Milus Banister, MD REFERRED BY: [facility] PROCEDURE DATE:  01/12/2016 PROCEDURE:   Colonoscopy, screening and Colonoscopy with snare polypectomy First Screening Colonoscopy - Avg.  risk and is 50 yrs.  old or older Yes.  Prior Negative Screening - Now for repeat screening. N/A  History of Adenoma - Now for follow-up colonoscopy & has been > or = to 3 yrs.  N/A  Polyps removed today? Yes ASA CLASS:   Class II INDICATIONS:Screening for colonic neoplasia and Colorectal Neoplasm Risk Assessment for this procedure is average risk. MEDICATIONS: Monitored anesthesia care, Propofol 350 mg IV, and Lidocaine 40 mg IV  DESCRIPTION OF PROCEDURE:   After the risks benefits and alternatives of the procedure were thoroughly explained, informed consent was obtained.  The digital rectal exam revealed no abnormalities of the rectum.   The LB CF-H180AL Loaner [id number] endoscope was introduced through the anus and advanced to the cecum, which was identified by both the appendix and ileocecal valve. No adverse events experienced.   The quality of the prep was excellent.  The instrument was then slowly withdrawn as the colon was fully examined. Estimated blood loss is zero unless otherwise noted in this procedure report.   COLON FINDINGS: Two sessile polyps were found, removed and sent to pathology.  These were located in transverse and descending segments, 6-88mm across, removed with cold snare.  The examination was otherwise normal.  Retroflexed views revealed no abnormalities. The time to cecum = 15.2 Withdrawal time = 8.7   The scope was withdrawn and the procedure completed. COMPLICATIONS: There were no immediate complications.  ENDOSCOPIC IMPRESSION: Two sessile polyps were found, removed  and sent to pathology.  These were located in transverse and descending segments, 6-46mm across, removed with cold snare.  The examination was otherwise normal  RECOMMENDATIONS: If the polyp(s) removed today are proven to be adenomatous (pre-cancerous) polyps, you will need a repeat colonoscopy in 5 years.  Otherwise you should continue to follow colorectal cancer screening guidelines for "routine risk" patients with colonoscopy in 10 years.  You will receive a letter within 1-2 weeks with the results of your biopsy as well as final recommendations.  Please call my office if you have not received a letter after 3 weeks.  eSigned:  Milus Banister, MD 01/12/2016 4:15 PM

## 2016-01-12 NOTE — Progress Notes (Signed)
Called to room to assist during endoscopic procedure.  Patient ID and intended procedure confirmed with present staff. Received instructions for my participation in the procedure from the performing physician.  

## 2016-01-12 NOTE — Telephone Encounter (Signed)
Patient's wife phoned in. Pt took prep at 6pm but it did not start working until 5am. She states when it did work, he did have a good response. Wondered if they should start it a bit earlier today. Advised to go ahead and take prep [id number] am instead of scheduled 11am and to call if he does not have any results.

## 2016-01-13 ENCOUNTER — Telehealth: Payer: Self-pay | Admitting: *Deleted

## 2016-01-13 NOTE — Telephone Encounter (Signed)
No answer, left message to call if questions or concerns. 

## 2016-01-25 ENCOUNTER — Encounter: Payer: Self-pay | Admitting: Gastroenterology

## 2016-02-09 ENCOUNTER — Emergency Department (HOSPITAL_COMMUNITY)
Admission: EM | Admit: 2016-02-09 | Discharge: 2016-02-09 | Disposition: A | Discharge status: Home / Self Care | Payer: Medicaid Other | Attending: Emergency Medicine | Admitting: Emergency Medicine

## 2016-02-09 ENCOUNTER — Encounter (HOSPITAL_COMMUNITY): Payer: Self-pay | Admitting: *Deleted

## 2016-02-09 ENCOUNTER — Emergency Department (HOSPITAL_COMMUNITY): Payer: Medicaid Other

## 2016-02-09 DIAGNOSIS — Z87891 Personal history of nicotine dependence: Secondary | ICD-10-CM | POA: Insufficient documentation

## 2016-02-09 DIAGNOSIS — E039 Hypothyroidism, unspecified: Secondary | ICD-10-CM | POA: Insufficient documentation

## 2016-02-09 DIAGNOSIS — M170 Bilateral primary osteoarthritis of knee: Secondary | ICD-10-CM | POA: Diagnosis not present

## 2016-02-09 DIAGNOSIS — Z79899 Other long term (current) drug therapy: Secondary | ICD-10-CM | POA: Insufficient documentation

## 2016-02-09 DIAGNOSIS — M419 Scoliosis, unspecified: Secondary | ICD-10-CM | POA: Diagnosis not present

## 2016-02-09 DIAGNOSIS — N182 Chronic kidney disease, stage 2 (mild): Secondary | ICD-10-CM | POA: Insufficient documentation

## 2016-02-09 DIAGNOSIS — M79674 Pain in right toe(s): Secondary | ICD-10-CM | POA: Diagnosis not present

## 2016-02-09 DIAGNOSIS — Z87820 Personal history of traumatic brain injury: Secondary | ICD-10-CM | POA: Insufficient documentation

## 2016-02-09 MED ORDER — NAPROXEN 500 MG PO TABS: 500.0000 mg | ORAL_TABLET | Freq: Two times a day (BID) | ORAL | Status: DC

## 2016-02-09 NOTE — Discharge Instructions (Signed)
Take the prescribed medication as directed. °Follow-up with your primary care physician. °Return to the ED for new or worsening symptoms. ° °

## 2016-02-09 NOTE — ED Provider Notes (Signed)
CSN: [id number]     Arrival date & time 02/09/16  [id number] History  By signing my name below, I, Evelene Croon, attest that this documentation has been prepared under the direction and in the presence of non-physician practitioner, Quincy Carnes, PA-C. Electronically Signed: Evelene Croon, Scribe. 02/09/2016. 9:29 AM.    Chief Complaint  Patient presents with  . Toe Pain   The history is provided by the patient. No language interpreter was used.    HPI Comments:  Samuel Luna is a 55 y.o. male who presents to the Emergency Department complaining of moderate constant right great toe pain with associated swelling at the site x 2 days. He denies injury/trauma and h/o gout. Pt has been taking ibuprofen with mild relief. He also denies fever.  No numbness/weakness of foot/toes.  VSS.  Past Medical History  Diagnosis Date  . Thyroid disease     hypothyroidism  . 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 Scoliosis   . Arthritis     knees  . GERD (gastroesophageal reflux disease)   . Chronic kidney disease     mild   Past Surgical History  Procedure Laterality Date  . Lymphadenectomy Left 2016    2 lymph nodes left arm   Family History  Problem Relation Age of Onset  . Colon cancer Neg Hx    Social History  Substance Use Topics  . Smoking status: Former Research scientist (life sciences)  . Smokeless tobacco: Never Used  . Alcohol Use: 0.0 oz/week    0 Standard drinks or equivalent per week     Comment: occ    Review of Systems  Constitutional: Negative for fever and chills.  Musculoskeletal: Positive for myalgias and arthralgias.  Neurological: Negative for weakness and numbness.  All other systems reviewed and are negative.  Allergies  Review of patient's allergies indicates no known allergies.  Home Medications   Prior to Admission medications   Medication Sig Start Date End Date Taking? Authorizing Provider  ibuprofen (ADVIL,MOTRIN) 200 MG tablet Take 200 mg by mouth as needed.    Historical Provider, MD   levothyroxine (SYNTHROID, LEVOTHROID) 25 MCG tablet Take 50 mcg by mouth daily before breakfast.     Historical Provider, MD   BP 128/88 mmHg  Pulse 78  Temp(Src) 97.4 F (36.3 C) (Oral)  Resp 18  SpO2 99%\  Physical Exam  Constitutional: He is oriented to person, place, and time. He appears well-developed and well-nourished.  HENT:  Head: Normocephalic and atraumatic.  Mouth/Throat: Oropharynx is clear and moist.  Eyes: Conjunctivae and EOM are normal. Pupils are equal, round, and reactive to light.  Neck: Normal range of motion.  Cardiovascular: Normal rate, regular rhythm and normal heart sounds.   Pulmonary/Chest: Effort normal and breath sounds normal.  Abdominal: Soft. Bowel sounds are normal.  Musculoskeletal: Normal range of motion.  Right great toe overall normal in appearance without significant swelling or erythema, no warmth to touch, no open wounds or sores, full flexion-extension of toe maintained, normal gait, DP pulse intact, foot warm and well-perfused  Neurological: He is alert and oriented to person, place, and time.  Skin: Skin is warm and dry.  Psychiatric: He has a normal mood and affect.  Nursing note and vitals reviewed.   ED Course  Procedures   DIAGNOSTIC STUDIES:  Oxygen Saturation is 99% on RA, normal by my interpretation.    COORDINATION OF CARE:  9:34 AM Will order XR of the right great toe. Discussed treatment plan with pt at bedside  and pt agreed to plan.  Imaging Review Dg Toe Great Right  02/09/2016  CLINICAL DATA:  Proximal great toe pain for 2 days with limited weight-bearing. No acute injury. EXAM: RIGHT GREAT TOE COMPARISON:  None. FINDINGS: The mineralization and alignment are normal. There is no evidence of acute fracture or dislocation. There are minimal degenerative changes of the first metatarsal phalangeal joint. There also degenerative changes at the interphalangeal joint of the great toe with fragmented spurring along the lateral  base of the distal phalanx. This could be related to an old fracture. No focal soft tissue abnormalities identified. IMPRESSION: No acute osseous findings. Degenerative changes as described with possible old fracture involving the lateral base of the distal phalanx. Electronically Signed   By: Richardean Sale M.D.   On: 02/09/2016 10:01   I have personally reviewed and evaluated these images and lab results as part of my medical decision-making.   MDM   Final diagnoses:  Toe pain, right   55 year old male here with right great toe pain. Pain is throbbing in nature. No known injury, trauma, or falls. Toe is overall normal in appearance without significant swelling or bony deformity. There are no open wounds, no erythema, or warmth to touch. X-ray with questions of old fracture of distal phalanx.  Patient is unaware of any prior fractures.  Exam findings not clinically consistent with septic joint, gout, or fracture/dislocation.  Patient d/c home with naprosyn, encouraged to follow-up with PCP.  Discussed plan with patient, he/she acknowledged understanding and agreed with plan of care.  Return precautions given for new or worsening symptoms.  I personally performed the services described in this documentation, which was scribed in my presence. The recorded information has been reviewed and is accurate.  Larene Pickett, PA-C 02/09/16 1245  Tanna Furry, MD 02/16/16 2255

## 2016-02-09 NOTE — ED Notes (Signed)
Pt reports rt toe pain and intermittent swelling. No visible wounds to foot.

## 2018-02-09 IMAGING — DX DG TOE GREAT 2+V*R*
3 series · 3 of 3 positions shown · non-contrast
Comparison: None.

CLINICAL DATA: Proximal great toe pain for 2 days with limited
weight-bearing. No acute injury.

EXAM:
RIGHT GREAT TOE

[x toes ap right]
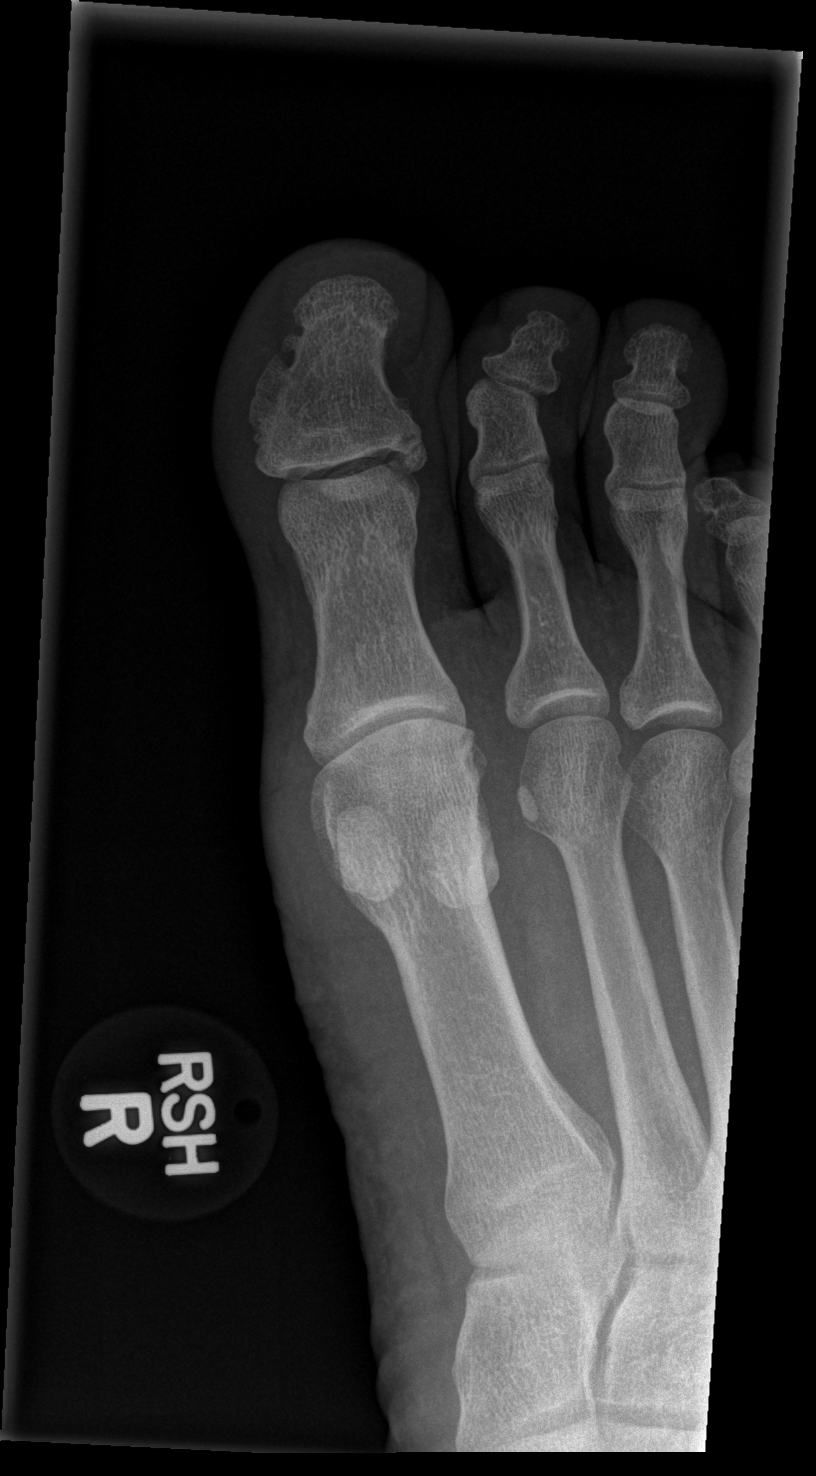

[x toes obl right]
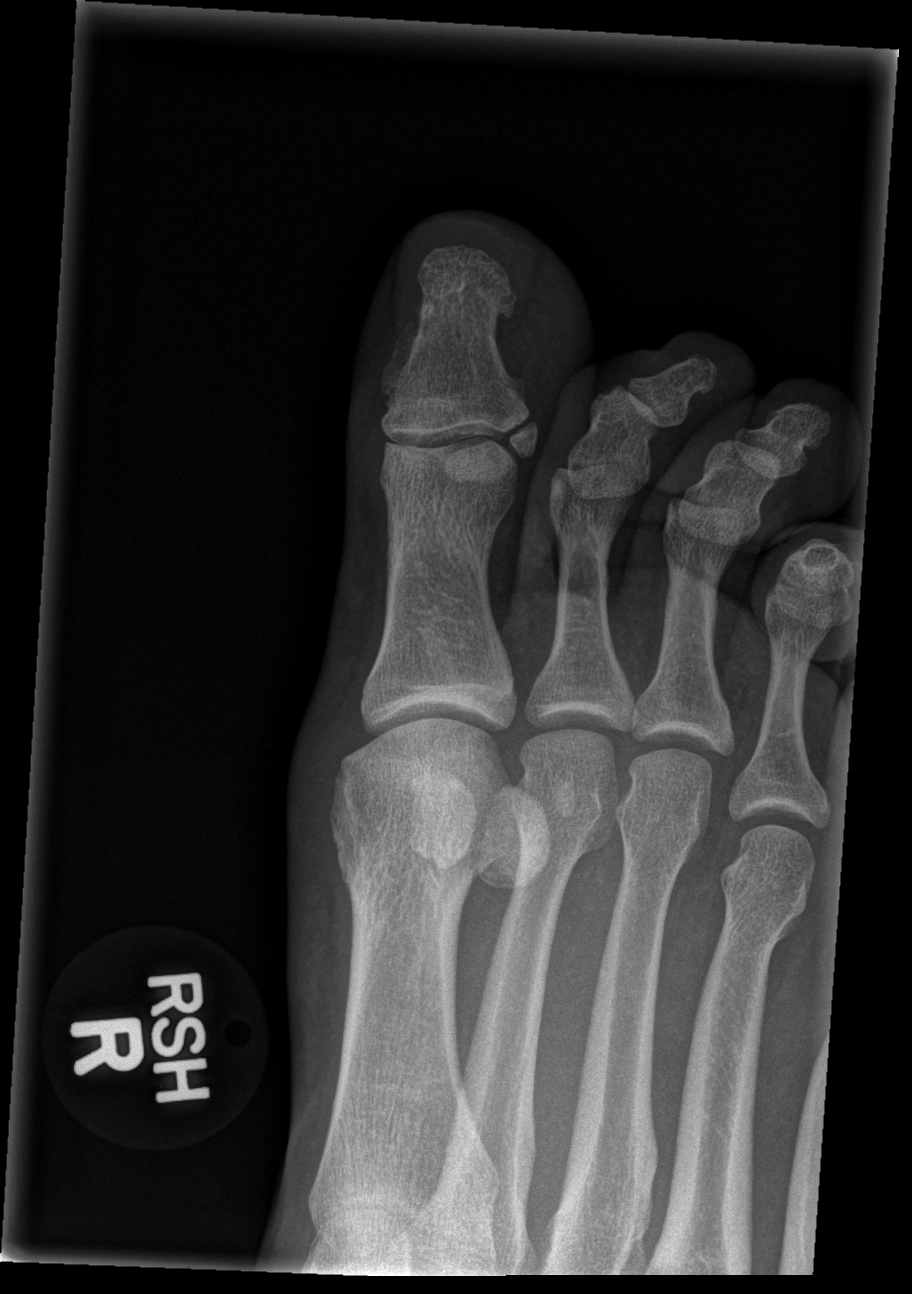

[x toes lat right]
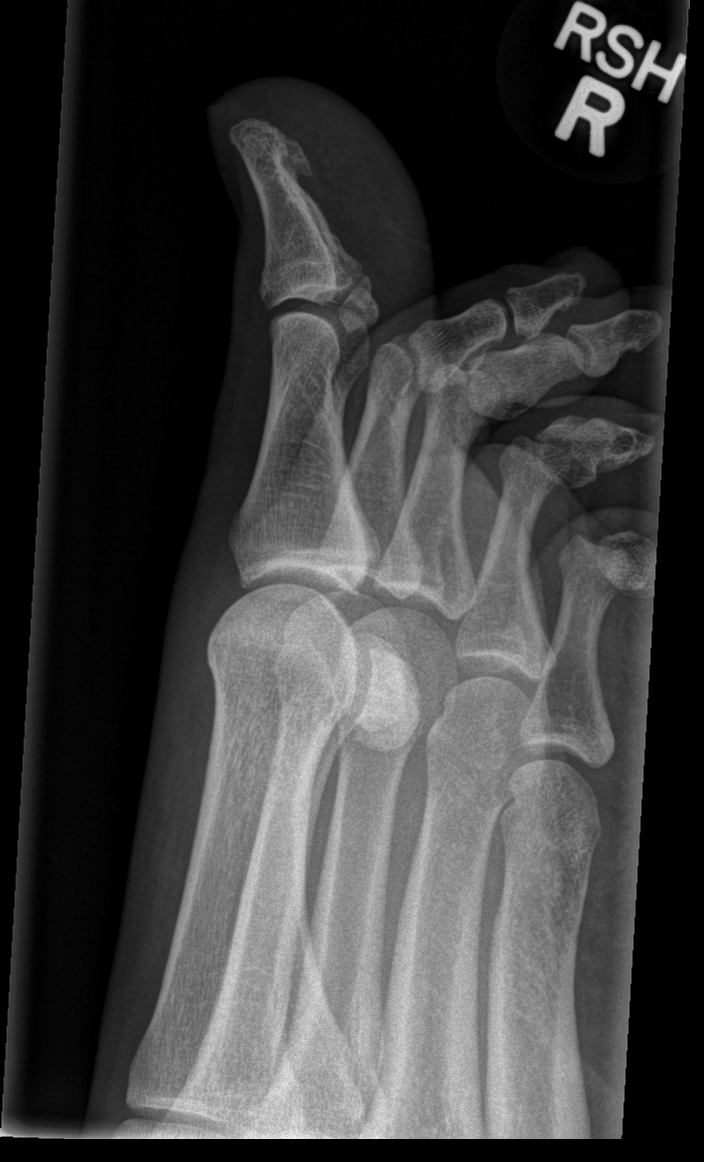

[3 of 3 positions shown; findings below may reference images not displayed]

FINDINGS: The mineralization and alignment are normal. There is no evidence of
acute fracture or dislocation. There are minimal degenerative
changes of the first metatarsal phalangeal joint. There also
degenerative changes at the interphalangeal joint of the great toe
with fragmented spurring along the lateral base of the distal
phalanx. This could be related to an old fracture. No focal soft
tissue abnormalities identified.
IMPRESSION: No acute osseous findings. Degenerative changes as described with
possible old fracture involving the lateral base of the distal
phalanx.

## 2018-02-15 ENCOUNTER — Encounter: Payer: Self-pay | Admitting: Gastroenterology

## 2018-02-15 ENCOUNTER — Ambulatory Visit: Payer: Medicaid Other | Admitting: Gastroenterology

## 2018-02-15 ENCOUNTER — Encounter (INDEPENDENT_AMBULATORY_CARE_PROVIDER_SITE_OTHER): Payer: Self-pay

## 2018-02-15 VITALS — BP 110/70 | HR 72 | Ht 72.75 in | Wt 221.5 lb

## 2018-02-15 DIAGNOSIS — K219 Gastro-esophageal reflux disease without esophagitis: Secondary | ICD-10-CM

## 2018-02-15 DIAGNOSIS — R1310 Dysphagia, unspecified: Secondary | ICD-10-CM | POA: Diagnosis not present

## 2018-02-15 MED ORDER — OMEPRAZOLE 40 MG PO CPDR: 40.0000 mg | DELAYED_RELEASE_CAPSULE | Freq: Every day | ORAL | 11 refills | Status: DC

## 2018-02-15 NOTE — Progress Notes (Signed)
Review of pertinent gastrointestinal problems: 1.  Elevated risk for colon cancer, personal history of precancerous colon polyps: Colonoscopy to 2017 Dr. Ardis Hughs done for routine risk screening found two subcentimeter adenomas.  He was recommended to have repeat colonoscopy for surveillance at 5-year interval    HPI: This is a very pleasant 57 year old man who was referred to me by Pa, [facility]  to evaluate GERD, dysphasia.    Chief complaint is GERD, dysphasia I last saw him about 2 years ago the time of a screening colonoscopy.  See those results above.  He is here today for a different reason. For at least 6 months he has had difficulty with epigastric burning, pyrosis.  He has had mild regurg associated with this as well.  Usually happens after his breakfast meal.  Sometimes he has pyrosis when laying down at night.  He does admit to some mild dysphasia-like symptoms to solid food.  He does not have all of his teeth and so that may be playing a role.  His weight has been overall stable.   Started dexilant about 3 weeks ago.  This has helped but not completely.  Was taking a different pill "very big pill" prior that TID that didn't help.  Overall stable weigt, maybe gaining weight.   Old Data Reviewed: [place] 11/2017 indications "eva for gallstones": examinatoin was normal. Labs 10/2017:        Review of systems: Pertinent positive and negative review of systems were noted in the above HPI section. All other review negative.   Past Medical History:  Diagnosis Date  . Arthritis    knees  . 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 Chronic kidney disease    mild  . GERD (gastroesophageal reflux disease)   . Scoliosis   . Thyroid disease    hypothyroidism    Past Surgical History:  Procedure Laterality Date  . LYMPHADENECTOMY Left 2016   2 lymph nodes left arm    Current Outpatient Medications  Medication Sig Dispense Refill  . ibuprofen (ADVIL,MOTRIN) 200 MG tablet Take 200 mg by  mouth as needed.    Marland Kitchen levothyroxine (SYNTHROID, LEVOTHROID) 25 MCG tablet Take 50 mcg by mouth daily before breakfast.     . naproxen (NAPROSYN) 500 MG tablet Take 1 tablet (500 mg total) by mouth 2 (two) times daily with a meal. 30 tablet 0   No current facility-administered medications for this visit.     Allergies as of 02/15/2018  . (No Known Allergies)    Family History  Problem Relation Age of Onset  . Colon cancer Neg Hx     Social History   Socioeconomic History  . Marital status: Married    Spouse name: Not on file  . Number of children: Not on file  . Years of education: Not on file  . Highest education level: Not on file  Social Needs  . Financial resource strain: Not on file  . Food insecurity - worry: Not on file  . Food insecurity - inability: Not on file  . Transportation needs - medical: Not on file  . Transportation needs - non-medical: Not on file  Occupational History  . Not on file  Tobacco Use  . Smoking status: Former Research scientist (life sciences)  . Smokeless tobacco: Never Used  Substance and Sexual Activity  . Alcohol use: Yes    Alcohol/week: 0.0 oz    Comment: occ  . Drug use: No  . Sexual activity: Not on file  Other Topics Concern  .  Not on file  Social History Narrative  . Not on file     Physical Exam: Ht 6' 0.75" (1.848 m) Comment: height measured without shoes  Wt 221 lb 8 oz (100.5 kg)   BMI 29.42 kg/m  Constitutional: generally well-appearing Psychiatric: alert and oriented x3 Eyes: extraocular movements intact Mouth: oral pharynx moist, no lesions Neck: supple no lymphadenopathy Cardiovascular: heart regular rate and rhythm Lungs: clear to auscultation bilaterally Abdomen: soft, nontender, nondistended, no obvious ascites, no peritoneal signs, normal bowel sounds Extremities: no lower extremity edema bilaterally Skin: no lesions on visible extremities   Assessment and plan: 57 y.o. male with pyrosis, epigastric burning, mild dysphasia,  edentulous  First I do think he is having an issue with GERD.  The fact that he has no teeth probably plays a role in his mild swallowing difficulty as well.  He is currently on Dexilant which is a very good antiacid medicine event but I am going to change it to omeprazole 40 mg, 1 pill taken shortly before his breakfast meal every day.  He will also start a H2 blocker at bedtime nightly for good overnight acid control.  Lastly I recommended an upper endoscopy to exclude significant pathology such as neoplasm, Barrett's esophagus.    Please see the "Patient Instructions" section for addition details about the plan.   Owens Loffler, MD [facility] 02/15/2018, 1:57 PM  Cc: Pa[facility]

## 2018-02-15 NOTE — Patient Instructions (Addendum)
Stop dexilant Start omeprazole 40mg  pill, one pill before BF daily.  Disp 30 with 11 refills. Ranitidine 150mg , one pill OTC, at bedtime nightly. You will be set up for an upper endoscopy for dysphagia, GERD.  Normal BMI (Body Mass Index- based on height and weight) is between 19 and 25. Your BMI today is Body mass index is 29.42 kg/m. Marland Kitchen Please consider follow up  regarding your BMI with your Primary Care Provider.

## 2018-04-02 ENCOUNTER — Encounter: Payer: Self-pay | Admitting: Gastroenterology

## 2018-04-02 ENCOUNTER — Other Ambulatory Visit: Payer: Self-pay

## 2018-04-02 ENCOUNTER — Ambulatory Visit (AMBULATORY_SURGERY_CENTER): Payer: Medicaid Other | Admitting: Gastroenterology

## 2018-04-02 VITALS — BP 108/54 | HR 46 | Temp 98.4°F | Resp 14 | Ht 72.75 in | Wt 221.0 lb

## 2018-04-02 DIAGNOSIS — K219 Gastro-esophageal reflux disease without esophagitis: Secondary | ICD-10-CM

## 2018-04-02 DIAGNOSIS — K2210 Ulcer of esophagus without bleeding: Secondary | ICD-10-CM | POA: Diagnosis not present

## 2018-04-02 MED ORDER — ONDANSETRON HCL 4 MG PO TABS: 4.0000 mg | ORAL_TABLET | Freq: Two times a day (BID) | ORAL | 3 refills | Status: DC

## 2018-04-02 MED ORDER — OMEPRAZOLE 40 MG PO CPDR: 40.0000 mg | DELAYED_RELEASE_CAPSULE | Freq: Two times a day (BID) | ORAL | 6 refills | Status: DC

## 2018-04-02 MED ORDER — SODIUM CHLORIDE 0.9 % IV SOLN: 500.0000 mL | Freq: Once | INTRAVENOUS | Status: DC

## 2018-04-02 NOTE — Op Note (Signed)
[place] Patient Name: Samuel Luna Procedure Date: 04/02/2018 3:09 PM MRN: [id number] Endoscopist: Milus Banister , MD Age: 57 Referring MD:  Date of Birth: 15-Sep-1961 Gender: Male Account #: [id number] Procedure:                Upper GI endoscopy Indications:              Dysphagia, Heartburn Medicines:                Monitored Anesthesia Care Procedure:                Pre-Anesthesia Assessment:                           - Prior to the procedure, a History and Physical                            was performed, and patient medications and                            allergies were reviewed. The patient's tolerance of                            previous anesthesia was also reviewed. The risks                            and benefits of the procedure and the sedation                            options and risks were discussed with the patient.                            All questions were answered, and informed consent                            was obtained. Prior Anticoagulants: The patient has                            taken no previous anticoagulant or antiplatelet                            agents. ASA Grade Assessment: II - A patient with                            mild systemic disease. After reviewing the risks                            and benefits, the patient was deemed in                            satisfactory condition to undergo the procedure.                           After obtaining informed consent, the endoscope was  passed under direct vision. Throughout the                            procedure, the patient's blood pressure, pulse, and                            oxygen saturations were monitored continuously. The                            Endoscope was introduced through the mouth, and                            advanced to the second part of duodenum. The upper                            GI endoscopy was accomplished  without difficulty.                            The patient tolerated the procedure well. Scope In: Scope Out: Findings:                 LA Grade D (one or more mucosal breaks involving at                            least 75% of esophageal circumference) esophagitis                            was found in the lower third of the esophagus. This                            caused slight stenosis at the GE junction however I                            was able to easily advance the adult gastroscope                            into the stomach.                           The exam was otherwise without abnormality. Complications:            No immediate complications. Estimated blood loss:                            None. Estimated Blood Loss:     Estimated blood loss: none. Impression:               - Severe, ulcerative, acid related esophagitis. Recommendation:           - Patient has a contact number available for                            emergencies. The signs and symptoms of potential  delayed complications were discussed with the                            patient. Return to normal activities tomorrow.                            Written discharge instructions were provided to the                            patient.                           - Resume previous diet.                           - Will increase your omeprazole to twice daily                            dosing (40mg  pill, one pill twice daily shortly                            before breakfast and dinner meals, disp 60 pills, 6                            refills). You should take your pepcid (20mg  pill)                            at bedtime every night. Stop taking carafate.                           - Repeat upper endoscopy in 6 weeks to check                            healing, will consider dilation of the GE junction                            if the acute esophagitis has healed. Milus Banister,  MD 04/02/2018 3:23:03 PM This report has been signed electronically.

## 2018-04-02 NOTE — Progress Notes (Signed)
Report given to PACU, vss 

## 2018-04-02 NOTE — Patient Instructions (Signed)
YOU HAD AN ENDOSCOPIC PROCEDURE TODAY AT [facility]:   Refer to the procedure report that was given to you for any specific questions about what was found during the examination.  If the procedure report does not answer your questions, please call your gastroenterologist to clarify.  If you requested that your care partner not be given the details of your procedure findings, then the procedure report has been included in a sealed envelope for you to review at your convenience later.  YOU SHOULD EXPECT: Some feelings of bloating in the abdomen. Passage of more gas than usual.  Walking can help get rid of the air that was put into your GI tract during the procedure and reduce the bloating. If you had a lower endoscopy (such as a colonoscopy or flexible sigmoidoscopy) you may notice spotting of blood in your stool or on the toilet paper. If you underwent a bowel prep for your procedure, you may not have a normal bowel movement for a few days.  Please Note:  You might notice some irritation and congestion in your nose or some drainage.  This is from the oxygen used during your procedure.  There is no need for concern and it should clear up in a day or so.  SYMPTOMS TO REPORT IMMEDIATELY:   Following upper endoscopy (EGD)  Vomiting of blood or coffee ground material  New chest pain or pain under the shoulder blades  Painful or persistently difficult swallowing  New shortness of breath  Fever of 100F or higher  Black, tarry-looking stools  For urgent or emergent issues, a gastroenterologist can be reached at any hour by calling [phone].  Please see handout on Esophagitis.  Stop Carafate. Increase omeprazole 40 mg twice a day shortly before breakfast and dinner. You should take your pepcid 20 mg at bedtime every night.  Zofran 4 mg by mouth twice a day.  Repeat upper Endoscopy in 6 weeks June 14 th at 4:00 pm. Pre visit will be on 5/29/2:30 pm   DIET:  We do recommend a  small meal at first, but then you may proceed to your regular diet.  Drink plenty of fluids but you should avoid alcoholic beverages for 24 hours.  ACTIVITY:  You should plan to take it easy for the rest of today and you should NOT DRIVE or use heavy machinery until tomorrow (because of the sedation medicines used during the test).    FOLLOW UP: Our staff will call the number listed on your records the next business day following your procedure to check on you and address any questions or concerns that you may have regarding the information given to you following your procedure. If we do not reach you, we will leave a message.  However, if you are feeling well and you are not experiencing any problems, there is no need to return our call.  We will assume that you have returned to your regular daily activities without incident.  If any biopsies were taken you will be contacted by phone or by letter within the next 1-3 weeks.  Please call us at [phone] if you have not heard about the biopsies in 3 weeks.    SIGNATURES/CONFIDENTIALITY: You and/or your care partner have signed paperwork which will be entered into your electronic medical record.  These signatures attest to the fact that that the information above on your After Visit Summary has been reviewed and is understood.  Full responsibility of the confidentiality of  this discharge information lies with you and/or your care-partner.  Thank you for letting us take care of your healthcare needs today.

## 2018-04-03 ENCOUNTER — Telehealth: Payer: Self-pay

## 2018-04-03 NOTE — Telephone Encounter (Signed)
  Follow up Call-  Call back number 04/02/2018 01/12/2016  Post procedure Call Back phone  # [phone]  Permission to leave phone message Yes Yes  Some recent data might be hidden     Patient questions:  Do you have a fever, pain , or abdominal swelling? No. Pain Score  0 *  Have you tolerated food without any problems? Yes.    Have you been able to return to your normal activities? Yes.    Do you have any questions about your discharge instructions: Diet   No. Medications  No. Follow up visit  No.  Do you have questions or concerns about your Care? No.  Actions: * If pain score is 4 or above: No action needed, pain <4.  No problems noted per pt. maw

## 2018-05-01 ENCOUNTER — Other Ambulatory Visit: Payer: Self-pay

## 2018-05-01 ENCOUNTER — Ambulatory Visit (AMBULATORY_SURGERY_CENTER): Payer: Self-pay

## 2018-05-01 VITALS — Ht 72.0 in | Wt 218.8 lb

## 2018-05-01 DIAGNOSIS — K2210 Ulcer of esophagus without bleeding: Secondary | ICD-10-CM

## 2018-05-01 NOTE — Progress Notes (Signed)
Denies allergies to eggs or soy products. Denies complication of anesthesia or sedation. Denies use of weight loss medication. Denies use of O2.   Emmi instructions declined.  

## 2018-05-14 DIAGNOSIS — S069XAA Unspecified intracranial injury with loss of consciousness status unknown, initial encounter: Secondary | ICD-10-CM | POA: Insufficient documentation

## 2018-05-14 DIAGNOSIS — S069X9A Unspecified intracranial injury with loss of consciousness of unspecified duration, initial encounter: Secondary | ICD-10-CM | POA: Insufficient documentation

## 2018-05-17 ENCOUNTER — Encounter: Payer: Self-pay | Admitting: Gastroenterology

## 2018-05-17 ENCOUNTER — Encounter: Payer: Medicaid Other | Admitting: Gastroenterology

## 2018-05-17 ENCOUNTER — Ambulatory Visit (AMBULATORY_SURGERY_CENTER): Payer: Medicaid Other | Admitting: Gastroenterology

## 2018-05-17 ENCOUNTER — Other Ambulatory Visit: Payer: Self-pay

## 2018-05-17 VITALS — BP 115/63 | HR 64 | Temp 98.7°F | Resp 23 | Ht 72.0 in | Wt 221.0 lb

## 2018-05-17 DIAGNOSIS — K219 Gastro-esophageal reflux disease without esophagitis: Secondary | ICD-10-CM | POA: Diagnosis not present

## 2018-05-17 DIAGNOSIS — K2210 Ulcer of esophagus without bleeding: Secondary | ICD-10-CM

## 2018-05-17 MED ORDER — SODIUM CHLORIDE 0.9 % IV SOLN: 500.0000 mL | Freq: Once | INTRAVENOUS | Status: DC

## 2018-05-17 MED ORDER — FAMOTIDINE 20 MG PO TABS: 20.0000 mg | ORAL_TABLET | Freq: Every day | ORAL | 11 refills | Status: DC

## 2018-05-17 NOTE — Patient Instructions (Signed)
**   Handout given on Esophagitis. **   YOU HAD AN ENDOSCOPIC PROCEDURE TODAY AT THE [facility]:   Refer to the procedure report that was given to you for any specific questions about what was found during the examination.  If the procedure report does not answer your questions, please call your gastroenterologist to clarify.  If you requested that your care partner not be given the details of your procedure findings, then the procedure report has been included in a sealed envelope for you to review at your convenience later.  YOU SHOULD EXPECT: Some feelings of bloating in the abdomen. Passage of more gas than usual.  Walking can help get rid of the air that was put into your GI tract during the procedure and reduce the bloating. If you had a lower endoscopy (such as a colonoscopy or flexible sigmoidoscopy) you may notice spotting of blood in your stool or on the toilet paper. If you underwent a bowel prep for your procedure, you may not have a normal bowel movement for a few days.  Please Note:  You might notice some irritation and congestion in your nose or some drainage.  This is from the oxygen used during your procedure.  There is no need for concern and it should clear up in a day or so.  SYMPTOMS TO REPORT IMMEDIATELY:   Following upper endoscopy (EGD)  Vomiting of blood or coffee ground material  New chest pain or pain under the shoulder blades  Painful or persistently difficult swallowing  New shortness of breath  Fever of 100F or higher  Black, tarry-looking stools  For urgent or emergent issues, a gastroenterologist can be reached at any hour by calling [phone].   DIET:  We do recommend a small meal at first, but then you may proceed to your regular diet.  Drink plenty of fluids but you should avoid alcoholic beverages for 24 hours.  ACTIVITY:  You should plan to take it easy for the rest of today and you should NOT DRIVE or use heavy machinery until tomorrow  (because of the sedation medicines used during the test).    FOLLOW UP: Our staff will call the number listed on your records the next business day following your procedure to check on you and address any questions or concerns that you may have regarding the information given to you following your procedure. If we do not reach you, we will leave a message.  However, if you are feeling well and you are not experiencing any problems, there is no need to return our call.  We will assume that you have returned to your regular daily activities without incident.  If any biopsies were taken you will be contacted by phone or by letter within the next 1-3 weeks.  Please call us at [phone] if you have not heard about the biopsies in 3 weeks.    SIGNATURES/CONFIDENTIALITY: You and/or your care partner have signed paperwork which will be entered into your electronic medical record.  These signatures attest to the fact that that the information above on your After Visit Summary has been reviewed and is understood.  Full responsibility of the confidentiality of this discharge information lies with you and/or your care-partner.

## 2018-05-17 NOTE — Progress Notes (Signed)
Report given to PACU, vss 

## 2018-05-17 NOTE — Op Note (Signed)
[place] Patient Name: Samuel Luna Procedure Date: 05/17/2018 3:21 PM MRN: [id number] Endoscopist: Milus Banister , MD Age: 57 Referring MD:  Date of Birth: [birth year] Gender: Male Account #: [id number] Procedure:                Upper GI endoscopy Indications:              Follow-up of reflux esophagitis (was LA Grade D                            ulcerative esopahgititis 6 weeks ago) Medicines:                Monitored Anesthesia Care Procedure:                Pre-Anesthesia Assessment:                           - Prior to the procedure, a History and Physical                            was performed, and patient medications and                            allergies were reviewed. The patient's tolerance of                            previous anesthesia was also reviewed. The risks                            and benefits of the procedure and the sedation                            options and risks were discussed with the patient.                            All questions were answered, and informed consent                            was obtained. Prior Anticoagulants: The patient has                            taken no previous anticoagulant or antiplatelet                            agents. ASA Grade Assessment: II - A patient with                            mild systemic disease. After reviewing the risks                            and benefits, the patient was deemed in                            satisfactory condition to undergo the procedure.  After obtaining informed consent, the endoscope was                            passed under direct vision. Throughout the                            procedure, the patient's blood pressure, pulse, and                            oxygen saturations were monitored continuously. The                            Endoscope was introduced through the mouth, and                            advanced to the second  part of duodenum. The upper                            GI endoscopy was accomplished without difficulty.                            The patient tolerated the procedure well. Scope In: Scope Out: Findings:                 Ulcerative esophagitis was found in the distal                            esophagus. This is slightly improved from 6-7 weeks                            ago, but is still severe (LA Grade C). No                            strictures or stenosis.                           The examination was otherwise normal. Complications:            No immediate complications. Estimated blood loss:                            None. Estimated Blood Loss:     Estimated blood loss: none. Impression:               - Persistent, but slightly improved reflux related                            ulcerative esopahgitis. Recommendation:           - Patient has a contact number available for                            emergencies. The signs and symptoms of potential                            delayed complications were discussed with the  patient. Return to normal activities tomorrow.                            Written discharge instructions were provided to the                            patient.                           - Resume previous diet.                           - Continue present medications. Need to ensure with                            his wife that he is taking omeprazole 40mg  twice                            daily (20-30 minutes prior to breakfast and dinner                            meals) as well as pepcid at bedtime.                           - Office appt with Dr. Ardis Hughs in 6-7 weeks. Milus Banister, MD 05/17/2018 3:33:29 PM This report has been signed electronically.

## 2018-05-17 NOTE — Progress Notes (Signed)
Pt. Reports no change in his medical or surgical history since his pre-visit 05/01/2018.  Pt. Did report an upset stomach yesterday.

## 2018-05-20 ENCOUNTER — Telehealth: Payer: Self-pay

## 2018-05-20 NOTE — Telephone Encounter (Signed)
  Follow up Call-  Call back number 05/17/2018 04/02/2018 01/12/2016  Post procedure Call Back phone  # [phone]  Permission to leave phone message Yes Yes Yes  Some recent data might be hidden     Patient questions:  Do you have a fever, pain , or abdominal swelling? No. Pain Score  0 *  Have you tolerated food without any problems? Yes.    Have you been able to return to your normal activities? Yes.    Do you have any questions about your discharge instructions: Diet   No. Medications  No. Follow up visit  No.  Do you have questions or concerns about your Care? No.  Actions: * If pain score is 4 or above: No action needed, pain <4.

## 2018-09-12 ENCOUNTER — Other Ambulatory Visit: Payer: Self-pay | Admitting: Gastroenterology

## 2018-11-04 ENCOUNTER — Encounter (HOSPITAL_COMMUNITY): Payer: Self-pay

## 2018-11-04 ENCOUNTER — Other Ambulatory Visit: Payer: Self-pay

## 2018-11-04 ENCOUNTER — Emergency Department (HOSPITAL_COMMUNITY)
Admission: EM | Admit: 2018-11-04 | Discharge: 2018-11-04 | Disposition: A | Discharge status: Home / Self Care | Payer: Medicaid Other | Attending: Emergency Medicine | Admitting: Emergency Medicine

## 2018-11-04 DIAGNOSIS — Z79899 Other long term (current) drug therapy: Secondary | ICD-10-CM | POA: Insufficient documentation

## 2018-11-04 DIAGNOSIS — R519 Headache, unspecified: Secondary | ICD-10-CM

## 2018-11-04 DIAGNOSIS — R51 Headache: Secondary | ICD-10-CM | POA: Diagnosis present

## 2018-11-04 DIAGNOSIS — Z87891 Personal history of nicotine dependence: Secondary | ICD-10-CM | POA: Insufficient documentation

## 2018-11-04 DIAGNOSIS — N182 Chronic kidney disease, stage 2 (mild): Secondary | ICD-10-CM | POA: Insufficient documentation

## 2018-11-04 DIAGNOSIS — R112 Nausea with vomiting, unspecified: Secondary | ICD-10-CM | POA: Insufficient documentation

## 2018-11-04 MED ORDER — PROCHLORPERAZINE MALEATE 10 MG PO TABS: 10.0000 mg | ORAL_TABLET | Freq: Two times a day (BID) | ORAL | 0 refills | Status: DC | PRN

## 2018-11-04 MED ORDER — DIPHENHYDRAMINE HCL 50 MG/ML IJ SOLN
25.0000 mg | Freq: Once | INTRAMUSCULAR | Status: AC
  Administered 2018-11-04: 25 mg via INTRAVENOUS
  Filled 2018-11-04: qty 1

## 2018-11-04 MED ORDER — SODIUM CHLORIDE 0.9 % IV BOLUS
1000.0000 mL | Freq: Once | INTRAVENOUS | Status: AC
  Administered 2018-11-04: 1000 mL via INTRAVENOUS

## 2018-11-04 MED ORDER — PROCHLORPERAZINE EDISYLATE 10 MG/2ML IJ SOLN
10.0000 mg | Freq: Once | INTRAMUSCULAR | Status: AC
  Administered 2018-11-04: 10 mg via INTRAVENOUS
  Filled 2018-11-04: qty 2

## 2018-11-04 NOTE — ED Notes (Signed)
Pt c/o occipital headache x 4 days w/ 1 episode of emesis x 4 days ago.  Denies blurred vision and photophobia.  Denies current n/v.

## 2018-11-04 NOTE — ED Provider Notes (Signed)
[facility] DEPT Provider Note   CSN: [id number] Arrival date & time: 11/04/18  0603     History   Chief Complaint Chief Complaint  Patient presents with  . Headache    HPI Paxten Appelt is a 57 y.o. male.  The history is provided by the patient and medical records. No language interpreter was used.  Headache   This is a recurrent problem. The current episode started more than 2 days ago. The problem occurs constantly. The problem has not changed since onset.The headache is associated with nothing. The pain is located in the occipital region. The quality of the pain is described as dull. The pain is at a severity of 10/10. The pain is severe. The pain does not radiate. Associated symptoms include nausea and vomiting. Pertinent negatives include no anorexia, no fever, no malaise/fatigue, no chest pressure, no near-syncope, no palpitations, no syncope and no shortness of breath. He has tried NSAIDs and aspirin for the symptoms. The treatment provided no relief.    Past Medical History:  Diagnosis Date  . Arthritis    knees  . 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 Chronic kidney disease    mild  . GERD (gastroesophageal reflux disease)   . Scoliosis   . Thyroid disease    hypothyroidism    Patient Active Problem List   Diagnosis Date Noted  . 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 HYPOTHYROIDISM 05/13/2008  . HYPERCHOLESTEROLEMIA 05/13/2008  . MILD COGNITIVE IMPAIRMENT SO STATED 05/12/2008  . KNEE PAIN, LEFT 05/12/2008  . SCOLIOSIS 05/12/2008  . SKIN RASH 05/12/2008    Past Surgical History:  Procedure Laterality Date  . LYMPHADENECTOMY Left 2016   2 lymph nodes left arm        Home Medications    Prior to Admission medications   Medication Sig Start Date End Date Taking? Authorizing Provider  allopurinol (ZYLOPRIM) 300 MG tablet Take 300 mg by mouth daily. 03/14/18   [provider]  famotidine (PEPCID) 20 MG tablet Take 1 tablet (20 mg total) by  mouth at bedtime. 05/17/18   Milus Banister, MD  ibuprofen (ADVIL,MOTRIN) 200 MG tablet Take 200 mg by mouth as needed.    [provider]  levothyroxine (SYNTHROID, LEVOTHROID) 150 MCG tablet Take 150 mcg by mouth daily. 03/03/18   [provider]  meclizine (ANTIVERT) 12.5 MG tablet Take 12.5 mg by mouth 3 (three) times daily as needed. 03/14/18   [provider]  meloxicam (MOBIC) 15 MG tablet Take 15 mg by mouth daily. with food 03/14/18   [provider]  naproxen (NAPROSYN) 500 MG tablet Take 1 tablet (500 mg total) by mouth 2 (two) times daily with a meal. 02/09/16   Larene Pickett, PA-C  omeprazole (PRILOSEC) 40 MG capsule Take 1 capsule (40 mg total) by mouth 2 (two) times daily. 40 mg twice daily shortly before breakfast and dinner. 04/02/18   Milus Banister, MD  ondansetron (ZOFRAN) 4 MG tablet TAKE 1 TABLET BY MOUTH 2 TIMES DAILY. 09/13/18   Milus Banister, MD    Family History Family History  Problem Relation Age of Onset  . Colon cancer Neg Hx   . Esophageal cancer Neg Hx   . Liver cancer Neg Hx   . Pancreatic cancer Neg Hx   . Rectal cancer Neg Hx   . Stomach cancer Neg Hx     Social History Social History   Tobacco Use  . Smoking status: Former Research scientist (life sciences)  .  Smokeless tobacco: Never Used  . Tobacco comment: Quit 12 years ago.   Substance Use Topics  . Alcohol use: Yes    Alcohol/week: 0.0 standard drinks    Comment: occ  . Drug use: No     Allergies   Patient has no known allergies.   Review of Systems Review of Systems  Constitutional: Negative for chills, diaphoresis, fatigue, fever and malaise/fatigue.  HENT: Negative for congestion.   Eyes: Negative for visual disturbance.  Respiratory: Negative for apnea, cough, choking, shortness of breath and wheezing.   Cardiovascular: Negative for chest pain, palpitations, leg swelling, syncope and near-syncope.  Gastrointestinal: Positive for nausea and vomiting. Negative for  abdominal pain, anorexia, constipation and diarrhea.  Genitourinary: Negative for dysuria.  Musculoskeletal: Negative for back pain, neck pain and neck stiffness.  Skin: Negative for rash and wound.  Neurological: Positive for headaches. Negative for dizziness, tremors, seizures, facial asymmetry, speech difficulty, weakness, light-headedness and numbness.  Psychiatric/Behavioral: Negative for agitation and confusion.  All other systems reviewed and are negative.    Physical Exam Updated Vital Signs BP (!) 131/96 (BP Location: Left Arm)   Pulse 74   Temp 98 F (36.7 C) (Oral)   Resp 16   SpO2 100%   Physical Exam  Constitutional: He is oriented to person, place, and time. He appears well-developed and well-nourished. No distress.  HENT:  Head: Normocephalic and atraumatic.  Mouth/Throat: Oropharynx is clear and moist. No oropharyngeal exudate.  Eyes: Pupils are equal, round, and reactive to light. Conjunctivae are normal.  Neck: Normal range of motion. Neck supple.  Cardiovascular: Normal rate and regular rhythm.  No murmur heard. Pulmonary/Chest: Effort normal and breath sounds normal. No stridor. No respiratory distress. He has no wheezes. He has no rales. He exhibits no tenderness.  Abdominal: Soft. There is no tenderness. There is no guarding.  Musculoskeletal: He exhibits no edema.  Lymphadenopathy:    He has no cervical adenopathy.  Neurological: He is alert and oriented to person, place, and time. No cranial nerve deficit or sensory deficit. He exhibits normal muscle tone. Coordination normal. GCS eye subscore is 4. GCS verbal subscore is 5. GCS motor subscore is 6.  Skin: Skin is warm and dry. Capillary refill takes less than 2 seconds. No rash noted. He is not diaphoretic. No erythema. No pallor.  Psychiatric: He has a normal mood and affect.  Nursing note and vitals reviewed.    ED Treatments / Results  Labs (all labs ordered are listed, but only abnormal results  are displayed) Labs Reviewed - No data to display  EKG None  Radiology No results found.  Procedures Procedures (including critical care time)  Medications Ordered in ED Medications  prochlorperazine (COMPAZINE) injection 10 mg (10 mg Intravenous Given 11/04/18 0742)  diphenhydrAMINE (BENADRYL) injection 25 mg (25 mg Intravenous Given 11/04/18 0742)  sodium chloride 0.9 % bolus 1,000 mL (0 mLs Intravenous Stopped 11/04/18 0909)     Initial Impression / Assessment and Plan / ED Course  I have reviewed the triage vital signs and the nursing notes.  Pertinent labs & imaging results that were available during my care of the patient were reviewed by me and considered in my medical decision making (see chart for details).     Nasire Reali is a 57 y.o. 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  Patient reports that for the last 4 days he has  had a headache in the back of his head.  He reports it has been constant and was gradual.  He denies any neurologic deficits or vision changes.  He reports some nausea and one episode of vomiting.  He reports taking over-the-counter medication without significant relief.  He reports no significant photophobia but he says his headache does feel similar to headaches he had in the past.  He denies any trauma associated with this headache.  Denies any neck pain or neck stiffness.  No fevers or chills.  No recent other complaints.  On exam, no focal neurologic deficit seen.  Normal strength sensation and coordination in extremities.  Normal extraocular events.  Speech is clear with no facial droop.  Extraocular movements intact and pupils are symmetric and reactive.  No nuchal rigidity and patient had normal neck range of motion without any pain or tenderness.  Lungs clear chest is nontender.  Abdomen nontender.  Suspect a more benign type headache.  Patient will  be given fluids, Compazine, Benadryl and try to treat his headache initially.  Have low suspicion for intracranial hemorrhage, trauma, or stroke based on his description of symptoms.  Without any neck pain or neck stiffness, have low suspicion for a vascular etiology from his neck.  If Patient is feeling better, anticipate PO challenge and discharge.   10:08 AM Patient reports headache is completely resolved.  He was ablated without difficulty.  He feels much better and wants to go home.  Next  Patient given prescription for Compazine and instructed to use Benadryl with it if he gets agitation or itching.  Patient voiced understanding the plan of care and will stay hydrated.  Will follow with PCP for further headache management.    Patient had no other questions or concerns and understood return precautions.  Patient discharged in good condition with resolved headache.    Final Clinical Impressions(s) / ED Diagnoses   Final diagnoses:  Bad headache    ED Discharge Orders         Ordered    prochlorperazine (COMPAZINE) 10 MG tablet  2 times daily PRN     11/04/18 1006          Clinical Impression: 1. Bad headache     Disposition: Discharge  Condition: Good  I have discussed the results, Dx and Tx plan with the pt(& family if present). He/she/they expressed understanding and agree(s) with the plan. Discharge instructions discussed at great length. Strict return precautions discussed and pt &/or family have verbalized understanding of the instructions. No further questions at time of discharge.    New Prescriptions   PROCHLORPERAZINE (COMPAZINE) 10 MG TABLET    Take 1 tablet (10 mg total) by mouth 2 (two) times daily as needed for nausea or vomiting.    Follow Up: Pa, [facility] [place] [id number] [phone]     [facility]-EMERGENC[facility] DEPT [place] [id number] [address] [place]  [place]       Chinelo Benn, Gwenyth Allegra, MD 11/04/18 1009

## 2018-11-04 NOTE — Discharge Instructions (Signed)
Your exam today was overall reassuring and we are able to fix her headache with medications.  Please use the headache medication we prescribed to help with symptoms.  If you get any agitation or itching, please consider using Benadryl as well.  Please follow-up with a primary doctor for further management.  If any symptoms change or worsen, please return to the nearest emergency department.  Please stay hydrated.

## 2018-11-04 NOTE — ED Triage Notes (Addendum)
Pt reports occipital headache x 4days. States that he has tried ibuprofen, aleve, and aspirin without relief. Endorses nausea this morning. Denies vomiting. Denies vision changes or photophobia. A&Ox4. Ambulatory.

## 2020-02-25 ENCOUNTER — Encounter (HOSPITAL_COMMUNITY): Payer: Self-pay | Admitting: Emergency Medicine

## 2020-02-25 ENCOUNTER — Ambulatory Visit (HOSPITAL_COMMUNITY)
Admission: EM | Admit: 2020-02-25 | Discharge: 2020-02-25 | Disposition: A | Discharge status: Home / Self Care | Payer: Medicaid Other | Attending: Internal Medicine | Admitting: Internal Medicine

## 2020-02-25 ENCOUNTER — Other Ambulatory Visit: Payer: Self-pay

## 2020-02-25 DIAGNOSIS — Z20822 Contact with and (suspected) exposure to covid-19: Secondary | ICD-10-CM | POA: Diagnosis present

## 2020-02-25 NOTE — Discharge Instructions (Addendum)
We have tested you for COVID  Go home and quarantine until we get your  results.  We will call if positive

## 2020-02-25 NOTE — ED Triage Notes (Signed)
Someone who lives in patient's household is COVID positive. That person has been sick for a few days. PT has no symptoms.

## 2020-02-25 NOTE — ED Provider Notes (Signed)
[place]    CSN: [id number] Arrival date & time: 02/25/20  1112      History   Chief Complaint Chief Complaint  Patient presents with  . Covid Exposure    HPI Samuel Luna is a 59 y.o. male.   Pt is a 59 year old male that presents for covid testing. Reports that he was exposed at home. He currently denies any symptoms.      Past Medical History:  Diagnosis Date  . Arthritis    knees  . 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 Chronic kidney disease    mild  . GERD (gastroesophageal reflux disease)   . Scoliosis   . Thyroid disease    hypothyroidism    Patient Active Problem List   Diagnosis Date Noted  . 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 HYPOTHYROIDISM 05/13/2008  . HYPERCHOLESTEROLEMIA 05/13/2008  . MILD COGNITIVE IMPAIRMENT SO STATED 05/12/2008  . KNEE PAIN, LEFT 05/12/2008  . SCOLIOSIS 05/12/2008  . SKIN RASH 05/12/2008    Past Surgical History:  Procedure Laterality Date  . LYMPHADENECTOMY Left 2016   2 lymph nodes left arm       Home Medications    Prior to Admission medications   Medication Sig Start Date End Date Taking? Authorizing Provider  levothyroxine (SYNTHROID, LEVOTHROID) 150 MCG tablet Take 150 mcg by mouth daily. 03/03/18  Yes [provider]  omeprazole (PRILOSEC) 40 MG capsule Take 1 capsule (40 mg total) by mouth 2 (two) times daily. 40 mg twice daily shortly before breakfast and dinner. 04/02/18  Yes Milus Banister, MD  allopurinol (ZYLOPRIM) 300 MG tablet Take 300 mg by mouth daily. 03/14/18   [provider]  famotidine (PEPCID) 20 MG tablet Take 1 tablet (20 mg total) by mouth at bedtime. 05/17/18   Milus Banister, MD  ibuprofen (ADVIL,MOTRIN) 200 MG tablet Take 200 mg by mouth as needed.    [provider]  meclizine (ANTIVERT) 12.5 MG tablet Take 12.5 mg by mouth 3 (three) times daily as needed. 03/14/18   [provider]  meloxicam (MOBIC) 15 MG tablet Take 15 mg by mouth daily. with food 03/14/18    [provider]  naproxen (NAPROSYN) 500 MG tablet Take 1 tablet (500 mg total) by mouth 2 (two) times daily with a meal. 02/09/16   Larene Pickett, PA-C  ondansetron (ZOFRAN) 4 MG tablet TAKE 1 TABLET BY MOUTH 2 TIMES DAILY. 09/13/18   Milus Banister, MD  prochlorperazine (COMPAZINE) 10 MG tablet Take 1 tablet (10 mg total) by mouth 2 (two) times daily as needed for nausea or vomiting. 11/04/18   Tegeler, Gwenyth Allegra, MD    Family History Family History  Problem Relation Age of Onset  . Colon cancer Neg Hx   . Esophageal cancer Neg Hx   . Liver cancer Neg Hx   . Pancreatic cancer Neg Hx   . Rectal cancer Neg Hx   . Stomach cancer Neg Hx     Social History Social History   Tobacco Use  . Smoking status: Former Research scientist (life sciences)  . Smokeless tobacco: Never Used  . Tobacco comment: Quit 12 years ago.   Substance Use Topics  . Alcohol use: Yes    Alcohol/week: 0.0 standard drinks    Comment: occ  . Drug use: No     Allergies   Patient has no known allergies.   Review of Systems Review of Systems  HENT: Negative for congestion, postnasal drip, rhinorrhea and  sinus pain.   Respiratory: Negative for cough, shortness of breath and wheezing.   Gastrointestinal: Negative for diarrhea.  Musculoskeletal: Negative for myalgias.     Physical Exam Triage Vital Signs ED Triage Vitals  Enc Vitals Group     BP 02/25/20 1149 (!) 144/72     Pulse Rate 02/25/20 1149 75     Resp 02/25/20 1149 16     Temp 02/25/20 1149 98.5 F (36.9 C)     Temp Source 02/25/20 1149 Oral     SpO2 02/25/20 1149 100 %     Weight --      Height --      Head Circumference --      Peak Flow --      Pain Score 02/25/20 1150 0     Pain Loc --      Pain Edu? --      Excl. in [place]? --    No data found.  Updated Vital Signs BP (!) 144/72   Pulse 75   Temp 98.5 F (36.9 C) (Oral)   Resp 16   SpO2 100%   Visual Acuity Right Eye Distance:   Left Eye Distance:   Bilateral Distance:    Right  Eye Near:   Left Eye Near:    Bilateral Near:     Physical Exam Vitals and nursing note reviewed.  Constitutional:      Appearance: Normal appearance.  HENT:     Head: Normocephalic and atraumatic.     Nose: Nose normal.  Eyes:     Conjunctiva/sclera: Conjunctivae normal.  Pulmonary:     Effort: Pulmonary effort is normal.  Musculoskeletal:        General: Normal range of motion.     Cervical back: Normal range of motion.  Skin:    General: Skin is warm and dry.  Neurological:     Mental Status: He is alert.  Psychiatric:        Mood and Affect: Mood normal.      UC Treatments / Results  Labs (all labs ordered are listed, but only abnormal results are displayed) Labs Reviewed  SARS CORONAVIRUS 2 (TAT 6-24 HRS)    EKG   Radiology No results found.  Procedures Procedures (including critical care time)  Medications Ordered in UC Medications - No data to display  Initial Impression / Assessment and Plan / UC Course  I have reviewed the triage vital signs and the nursing notes.  Pertinent labs & imaging results that were available during my care of the patient were reviewed by me and considered in my medical decision making (see chart for details).     Exposure to covid- swab sent for testing.  Pt symptomatic.  Final Clinical Impressions(s) / UC Diagnoses   Final diagnoses:  Exposure to COVID-19 virus     Discharge Instructions     We have tested you for COVID  Go home and quarantine until we get your  results.  We will call if positive       ED Prescriptions    None     PDMP not reviewed this encounter.   Orvan July, NP 02/25/20 1347

## 2020-02-26 LAB — SARS CORONAVIRUS 2 (TAT 6-24 HRS): SARS Coronavirus 2: NEGATIVE

## 2020-07-30 ENCOUNTER — Ambulatory Visit (INDEPENDENT_AMBULATORY_CARE_PROVIDER_SITE_OTHER): Payer: Medicaid Other

## 2020-07-30 ENCOUNTER — Ambulatory Visit (HOSPITAL_COMMUNITY)
Admission: EM | Admit: 2020-07-30 | Discharge: 2020-07-30 | Disposition: A | Discharge status: Home / Self Care | Payer: Medicaid Other | Attending: Family Medicine | Admitting: Family Medicine

## 2020-07-30 ENCOUNTER — Encounter (HOSPITAL_COMMUNITY): Payer: Self-pay

## 2020-07-30 ENCOUNTER — Other Ambulatory Visit: Payer: Self-pay

## 2020-07-30 DIAGNOSIS — R0602 Shortness of breath: Secondary | ICD-10-CM

## 2020-07-30 DIAGNOSIS — Z791 Long term (current) use of non-steroidal anti-inflammatories (NSAID): Secondary | ICD-10-CM | POA: Insufficient documentation

## 2020-07-30 DIAGNOSIS — E039 Hypothyroidism, unspecified: Secondary | ICD-10-CM | POA: Diagnosis not present

## 2020-07-30 DIAGNOSIS — I129 Hypertensive chronic kidney disease with stage 1 through stage 4 chronic kidney disease, or unspecified chronic kidney disease: Secondary | ICD-10-CM | POA: Diagnosis not present

## 2020-07-30 DIAGNOSIS — E7800 Pure hypercholesterolemia, unspecified: Secondary | ICD-10-CM | POA: Insufficient documentation

## 2020-07-30 DIAGNOSIS — Z87820 Personal history of traumatic brain injury: Secondary | ICD-10-CM | POA: Insufficient documentation

## 2020-07-30 DIAGNOSIS — K219 Gastro-esophageal reflux disease without esophagitis: Secondary | ICD-10-CM | POA: Insufficient documentation

## 2020-07-30 DIAGNOSIS — N189 Chronic kidney disease, unspecified: Secondary | ICD-10-CM | POA: Insufficient documentation

## 2020-07-30 DIAGNOSIS — Z20822 Contact with and (suspected) exposure to covid-19: Secondary | ICD-10-CM | POA: Diagnosis not present

## 2020-07-30 DIAGNOSIS — R42 Dizziness and giddiness: Secondary | ICD-10-CM | POA: Diagnosis not present

## 2020-07-30 DIAGNOSIS — M1990 Unspecified osteoarthritis, unspecified site: Secondary | ICD-10-CM | POA: Insufficient documentation

## 2020-07-30 DIAGNOSIS — Z79890 Hormone replacement therapy (postmenopausal): Secondary | ICD-10-CM | POA: Diagnosis not present

## 2020-07-30 DIAGNOSIS — Z87891 Personal history of nicotine dependence: Secondary | ICD-10-CM | POA: Diagnosis not present

## 2020-07-30 DIAGNOSIS — M419 Scoliosis, unspecified: Secondary | ICD-10-CM | POA: Diagnosis not present

## 2020-07-30 DIAGNOSIS — Z79899 Other long term (current) drug therapy: Secondary | ICD-10-CM | POA: Diagnosis not present

## 2020-07-30 DIAGNOSIS — R5383 Other fatigue: Secondary | ICD-10-CM

## 2020-07-30 LAB — T4, FREE: Free T4: <0.25 ng/dL — ABNORMAL LOW (ref 0.61–1.12)

## 2020-07-30 LAB — POCT URINALYSIS DIPSTICK, ED / UC
Bilirubin Urine: NEGATIVE
Glucose, UA: NEGATIVE mg/dL
Hgb urine dipstick: NEGATIVE
Ketones, ur: NEGATIVE mg/dL
Leukocytes,Ua: NEGATIVE
Nitrite: NEGATIVE
Protein, ur: NEGATIVE mg/dL
Specific Gravity, Urine: 1.02 (ref 1.005–1.030)
Urobilinogen, UA: 0.2 mg/dL (ref 0.0–1.0)
pH: 7 (ref 5.0–8.0)

## 2020-07-30 LAB — CBC WITH DIFFERENTIAL/PLATELET
Abs Immature Granulocytes: 0.03 10*3/uL (ref 0.00–0.07)
Basophils Absolute: 0 10*3/uL (ref 0.0–0.1)
Basophils Relative: 1 %
Eosinophils Absolute: 0.1 10*3/uL (ref 0.0–0.5)
Eosinophils Relative: 1 %
HCT: 47.6 % (ref 39.0–52.0)
Hemoglobin: 15.9 g/dL (ref 13.0–17.0)
Immature Granulocytes: 0 %
Lymphocytes Relative: 14 %
Lymphs Abs: 1.1 10*3/uL (ref 0.7–4.0)
MCH: 31.1 pg (ref 26.0–34.0)
MCHC: 33.4 g/dL (ref 30.0–36.0)
MCV: 93 fL (ref 80.0–100.0)
Monocytes Absolute: 0.5 10*3/uL (ref 0.1–1.0)
Monocytes Relative: 7 %
Neutro Abs: 6.1 10*3/uL (ref 1.7–7.7)
Neutrophils Relative %: 77 %
Platelets: 195 10*3/uL (ref 150–400)
RBC: 5.12 MIL/uL (ref 4.22–5.81)
RDW: 14.1 % (ref 11.5–15.5)
WBC: 7.9 10*3/uL (ref 4.0–10.5)
nRBC: 0 % (ref 0.0–0.2)

## 2020-07-30 LAB — COMPREHENSIVE METABOLIC PANEL
ALT: 22 U/L (ref 0–44)
AST: 31 U/L (ref 15–41)
Albumin: 4.1 g/dL (ref 3.5–5.0)
Alkaline Phosphatase: 85 U/L (ref 38–126)
Anion gap: 11 (ref 5–15)
BUN: 11 mg/dL (ref 6–20)
CO2: 25 mmol/L (ref 22–32)
Calcium: 9.6 mg/dL (ref 8.9–10.3)
Chloride: 103 mmol/L (ref 98–111)
Creatinine, Ser: 1.32 mg/dL — ABNORMAL HIGH (ref 0.61–1.24)
GFR calc Af Amer: >60 mL/min (ref >60)
GFR calc non Af Amer: 59 mL/min — ABNORMAL LOW (ref >60)
Glucose, Bld: 99 mg/dL (ref 70–99)
Potassium: 3.8 mmol/L (ref 3.5–5.1)
Sodium: 139 mmol/L (ref 135–145)
Total Bilirubin: 0.5 mg/dL (ref 0.3–1.2)
Total Protein: 6.9 g/dL (ref 6.5–8.1)

## 2020-07-30 LAB — CBG MONITORING, ED: Glucose-Capillary: 99 mg/dL (ref 70–99)

## 2020-07-30 LAB — TSH: TSH: 91.74 u[IU]/mL — ABNORMAL HIGH (ref 0.350–4.500)

## 2020-07-30 NOTE — Discharge Instructions (Addendum)
CBC, CMP pending, we call with abnormal results and will treat accordingly  UA was normal in office today  Blood sugar was ninety-nine  I do not think either of these are the cause of dizziness and fatigue  Checking thyroid as well  We will call you with any abnormal results  Follow up with PCP  Follow up in ER with shortness of breath, increased dizziness, increased fatigue, chest pain, other concerning symptoms

## 2020-07-30 NOTE — ED Provider Notes (Signed)
[place]    CSN: [id number] Arrival date & time: 07/30/20  1050      History   Chief Complaint Chief Complaint  Patient presents with  . Dizziness    HPI Samuel Luna is a 59 y.o. male.   Reports that he was dizzy at work earlier this morning and then he had another episode of dizziness upon returning to work. Denies chest pain today. Reports dyspnea on exertion when moving really heavy things at work. Hx brain injury, HTN, CKD, thyroid disease. Denies SOB, diaphoresis, fatigue, syncope, other symptoms)  ROS per HPI  The history is provided by the patient.  Dizziness   Past Medical History:  Diagnosis Date  . Arthritis    knees  . Brain injury ([place])   . Chronic kidney disease    mild  . GERD (gastroesophageal reflux disease)   . Scoliosis   . Thyroid disease    hypothyroidism    Patient Active Problem List   Diagnosis Date Noted  . Brain injury ([place])   . HYPOTHYROIDISM 05/13/2008  . HYPERCHOLESTEROLEMIA 05/13/2008  . MILD COGNITIVE IMPAIRMENT SO STATED 05/12/2008  . KNEE PAIN, LEFT 05/12/2008  . SCOLIOSIS 05/12/2008  . SKIN RASH 05/12/2008    Past Surgical History:  Procedure Laterality Date  . LYMPHADENECTOMY Left 2016   2 lymph nodes left arm       Home Medications    Prior to Admission medications   Medication Sig Start Date End Date Taking? Authorizing Provider  allopurinol (ZYLOPRIM) 300 MG tablet Take 300 mg by mouth daily. 03/14/18   [provider]  atorvastatin (LIPITOR) 20 MG tablet Take 20 mg by mouth at bedtime. 02/08/20   [provider]  famotidine (PEPCID) 20 MG tablet Take 1 tablet (20 mg total) by mouth at bedtime. 05/17/18   Milus Banister, MD  furosemide (LASIX) 20 MG tablet Take 20 mg by mouth daily as needed. 02/13/20   [provider]  ibuprofen (ADVIL,MOTRIN) 200 MG tablet Take 200 mg by mouth as needed.    [provider]  levothyroxine (SYNTHROID, LEVOTHROID) 150 MCG tablet  Take 150 mcg by mouth daily. 03/03/18   [provider]  meclizine (ANTIVERT) 12.5 MG tablet Take 12.5 mg by mouth 3 (three) times daily as needed. 03/14/18   [provider]  meloxicam (MOBIC) 15 MG tablet Take 15 mg by mouth daily. with food 03/14/18   [provider]  naproxen (NAPROSYN) 500 MG tablet Take 1 tablet (500 mg total) by mouth 2 (two) times daily with a meal. 02/09/16   Larene Pickett, PA-C  omeprazole (PRILOSEC) 40 MG capsule Take 1 capsule (40 mg total) by mouth 2 (two) times daily. 40 mg twice daily shortly before breakfast and dinner. 04/02/18   Milus Banister, MD  ondansetron (ZOFRAN) 4 MG tablet TAKE 1 TABLET BY MOUTH 2 TIMES DAILY. 09/13/18   Milus Banister, MD  prochlorperazine (COMPAZINE) 10 MG tablet Take 1 tablet (10 mg total) by mouth 2 (two) times daily as needed for nausea or vomiting. 11/04/18   Tegeler, Gwenyth Allegra, MD  Vitamin D, Ergocalciferol, (DRISDOL) 1.25 MG (50000 UNIT) CAPS capsule Take 50,000 Units by mouth once a week. 02/13/20   [provider]    Family History Family History  Problem Relation Age of Onset  . Colon cancer Neg Hx   . Esophageal cancer Neg Hx   . Liver cancer Neg Hx   . Pancreatic cancer Neg Hx   .  Rectal cancer Neg Hx   . Stomach cancer Neg Hx     Social History Social History   Tobacco Use  . Smoking status: Former Research scientist (life sciences)  . Smokeless tobacco: Never Used  . Tobacco comment: Quit 12 years ago.   Substance Use Topics  . Alcohol use: Yes    Alcohol/week: 0.0 standard drinks    Comment: occ  . Drug use: No     Allergies   Patient has no known allergies.   Review of Systems Review of Systems  Neurological: Positive for dizziness.     Physical Exam Triage Vital Signs ED Triage Vitals  Enc Vitals Group     BP 07/30/20 1142 137/71     Pulse Rate 07/30/20 1142 65     Resp 07/30/20 1142 16     Temp 07/30/20 1142 98.2 F (36.8 C)     Temp Source 07/30/20 1142 Oral     SpO2  07/30/20 1142 96 %     Weight 07/30/20 1145 150 lb (68 kg)     Height 07/30/20 1145 6' (1.829 m)     Head Circumference --      Peak Flow --      Pain Score 07/30/20 1145 1     Pain Loc --      Pain Edu? --      Excl. in [place]? --    No data found.  Updated Vital Signs BP 137/71   Pulse 65   Temp 98.2 F (36.8 C) (Oral)   Resp 16   Ht 6' (1.829 m)   Wt 150 lb (68 kg)   SpO2 96%   BMI 20.34 kg/m      Physical Exam Vitals and nursing note reviewed.  Constitutional:      General: He is not in acute distress.    Appearance: Normal appearance. He is well-developed. He is not ill-appearing.  HENT:     Head: Normocephalic and atraumatic.  Eyes:     Extraocular Movements: Extraocular movements intact.     Conjunctiva/sclera: Conjunctivae normal.     Pupils: Pupils are equal, round, and reactive to light.  Cardiovascular:     Rate and Rhythm: Normal rate and regular rhythm.     Heart sounds: Normal heart sounds. No murmur heard.  No friction rub. No gallop.   Pulmonary:     Effort: Pulmonary effort is normal. No respiratory distress.     Breath sounds: No stridor. Examination of the right-lower field reveals decreased breath sounds. Examination of the left-lower field reveals decreased breath sounds. Decreased breath sounds present. No wheezing, rhonchi or rales.  Chest:     Chest wall: No tenderness.  Abdominal:     General: Bowel sounds are normal.     Palpations: Abdomen is soft.     Tenderness: There is no abdominal tenderness.  Musculoskeletal:        General: Normal range of motion.     Cervical back: Normal range of motion and neck supple.  Skin:    General: Skin is warm and dry.     Capillary Refill: Capillary refill takes less than 2 seconds.  Neurological:     General: No focal deficit present.     Mental Status: He is alert and oriented to person, place, and time. Mental status is at baseline.  Psychiatric:        Mood and Affect: Mood normal.         Behavior: Behavior normal.  Thought Content: Thought content normal.      UC Treatments / Results  Labs (all labs ordered are listed, but only abnormal results are displayed) Labs Reviewed  SARS CORONAVIRUS 2 (TAT 6-24 HRS)  CBC WITH DIFFERENTIAL/PLATELET  COMPREHENSIVE METABOLIC PANEL  TSH  T4, FREE  CBG MONITORING, ED  POCT URINALYSIS DIPSTICK, ED / UC    EKG   Radiology DG Chest 2 View  Result Date: 07/30/2020 CLINICAL DATA:  Shortness of breath. EXAM: CHEST - 2 VIEW COMPARISON:  None. FINDINGS: The heart size and mediastinal contours are within normal limits. Both lungs are clear. No pneumothorax or pleural effusion is noted. The visualized skeletal structures are unremarkable. IMPRESSION: No active cardiopulmonary disease. Electronically Signed   By: Marijo Conception M.D.   On: 07/30/2020 13:22    Procedures Procedures (including critical care time)  Medications Ordered in UC Medications - No data to display  Initial Impression / Assessment and Plan / UC Course  I have reviewed the triage vital signs and the nursing notes.  Pertinent labs & imaging results that were available during my care of the patient were reviewed by me and considered in my medical decision making (see chart for details).  Clinical Course as of Jul 31 1343  Fri Jul 30, 2020  1217 POC CBG monitoring [SM]    Clinical Course User Index [SM] Faustino Congress, NP    Dizziness Fatigue  UA negative CBG 99 EKG sinus brady CXR negative Will check CBC, CMP, thyroid as pt has hx hypothyroidism and cannot recall the last time he had his thyroid checked Will call with abnormal results and treat accordingly Covid swab obtained in office today.  Patient instructed to quarantine until results are back and negative.  If results are negative, patient may resume daily schedule as tolerated once they are fever free for 24 hours without the use of antipyretic medications.  If results are positive,  patient instructed to quarantine 10 days from today.  Patient instructed to follow-up with primary care with this office as needed.  Patient instructed to follow-up in the ER for trouble swallowing, trouble breathing, other concerning symptoms.  Final Clinical Impressions(s) / UC Diagnoses   Final diagnoses:  Dizziness  Other fatigue     Discharge Instructions     CBC, CMP pending, we call with abnormal results and will treat accordingly  UA was normal in office today  Blood sugar was ninety-nine  I do not think either of these are the cause of dizziness and fatigue  Checking thyroid as well  We will call you with any abnormal results  Follow up with PCP  Follow up in ER with shortness of breath, increased dizziness, increased fatigue, chest pain, other concerning symptoms    ED Prescriptions    None     PDMP not reviewed this encounter.   Faustino Congress, NP 07/30/20 1344

## 2020-07-30 NOTE — ED Triage Notes (Addendum)
Pt states started having dizziness at work today at 1030. Pt c/o 1/10 non radiating left sided chest painx2days. Pt c/o dyspnea w/exertionx1-2 wks.

## 2020-07-31 LAB — SARS CORONAVIRUS 2 (TAT 6-24 HRS): SARS Coronavirus 2: NEGATIVE

## 2022-03-08 ENCOUNTER — Encounter: Payer: Self-pay | Admitting: Gastroenterology

## 2022-04-02 NOTE — Progress Notes (Incomplete)
? ?New Patient Office Visit ? ?Subjective   ? ?Patient ID: Samuel Luna, male    DOB: 1961/03/10  Age: 61 y.o. MRN: [id number] ? ?CC: No chief complaint on file. ? ? ?HPI ?Samuel Luna presents to establish care ?Colon hcv tdap ? ?Outpatient Encounter Medications as of 04/03/2022  ?Medication Sig  ?? allopurinol (ZYLOPRIM) 300 MG tablet Take 300 mg by mouth daily.  ?? atorvastatin (LIPITOR) 20 MG tablet Take 20 mg by mouth at bedtime.  ?? famotidine (PEPCID) 20 MG tablet Take 1 tablet (20 mg total) by mouth at bedtime.  ?? furosemide (LASIX) 20 MG tablet Take 20 mg by mouth daily as needed.  ?? ibuprofen (ADVIL,MOTRIN) 200 MG tablet Take 200 mg by mouth as needed.  ?? levothyroxine (SYNTHROID, LEVOTHROID) 150 MCG tablet Take 150 mcg by mouth daily.  ?? meclizine (ANTIVERT) 12.5 MG tablet Take 12.5 mg by mouth 3 (three) times daily as needed.  ?? meloxicam (MOBIC) 15 MG tablet Take 15 mg by mouth daily. with food  ?? naproxen (NAPROSYN) 500 MG tablet Take 1 tablet (500 mg total) by mouth 2 (two) times daily with a meal.  ?? omeprazole (PRILOSEC) 40 MG capsule Take 1 capsule (40 mg total) by mouth 2 (two) times daily. 40 mg twice daily shortly before breakfast and dinner.  ?? ondansetron (ZOFRAN) 4 MG tablet TAKE 1 TABLET BY MOUTH 2 TIMES DAILY.  ?? prochlorperazine (COMPAZINE) 10 MG tablet Take 1 tablet (10 mg total) by mouth 2 (two) times daily as needed for nausea or vomiting.  ?? Vitamin D, Ergocalciferol, (DRISDOL) 1.25 MG (50000 UNIT) CAPS capsule Take 50,000 Units by mouth once a week.  ? ?Facility-Administered Encounter Medications as of 04/03/2022  ?Medication  ?? 0.9 %  sodium chloride infusion  ?? 0.9 %  sodium chloride infusion  ? ? ?Past Medical History:  ?Diagnosis Date  ?? Arthritis   ? knees  ?? Brain injury ([place])   ?? Chronic kidney disease   ? mild  ?? GERD (gastroesophageal reflux disease)   ?? Scoliosis   ?? Thyroid disease   ? hypothyroidism  ? ? ?Past Surgical History:  ?Procedure Laterality Date   ?? LYMPHADENECTOMY Left 2016  ? 2 lymph nodes left arm  ? ? ?Family History  ?Problem Relation Age of Onset  ?? Colon cancer Neg Hx   ?? Esophageal cancer Neg Hx   ?? Liver cancer Neg Hx   ?? Pancreatic cancer Neg Hx   ?? Rectal cancer Neg Hx   ?? Stomach cancer Neg Hx   ? ? ?Social History  ? ?Socioeconomic History  ?? Marital status: Married  ?  Spouse name: Not on file  ?? Number of children: Not on file  ?? Years of education: Not on file  ?? Highest education level: Not on file  ?Occupational History  ?? Not on file  ?Tobacco Use  ?? Smoking status: Former  ?? Smokeless tobacco: Never  ?? Tobacco comments:  ?  Quit 12 years ago.   ?Substance and Sexual Activity  ?? Alcohol use: Yes  ?  Alcohol/week: 0.0 standard drinks  ?  Comment: occ  ?? Drug use: No  ?? Sexual activity: Not on file  ?Other Topics Concern  ?? Not on file  ?Social History Narrative  ?? Not on file  ? ?Social Determinants of Health  ? ?Financial Resource Strain: Not on file  ?Food Insecurity: Not on file  ?Transportation Needs: Not on file  ?Physical Activity: Not on file  ?  Stress: Not on file  ?Social Connections: Not on file  ?Intimate Partner Violence: Not on file  ? ? ?ROS ? ?  ? ? ?Objective   ? ?There were no vitals taken for this visit. ? ?Physical Exam ? ?{Labs (Optional):23779} ?  ? ?Assessment & Plan:  ? ?Problem List Items Addressed This Visit   ?None ? ? ?No follow-ups on file.  ? ?Asencion Noble, MD ? ? ?

## 2022-04-03 ENCOUNTER — Ambulatory Visit: Payer: Medicaid Other | Admitting: Critical Care Medicine

## 2022-07-31 IMAGING — DX DG CHEST 2V
2 series · 2 of 2 positions shown · non-contrast
Comparison: None.

CLINICAL DATA: Shortness of breath.

EXAM:
CHEST - 2 VIEW

[chest pa]
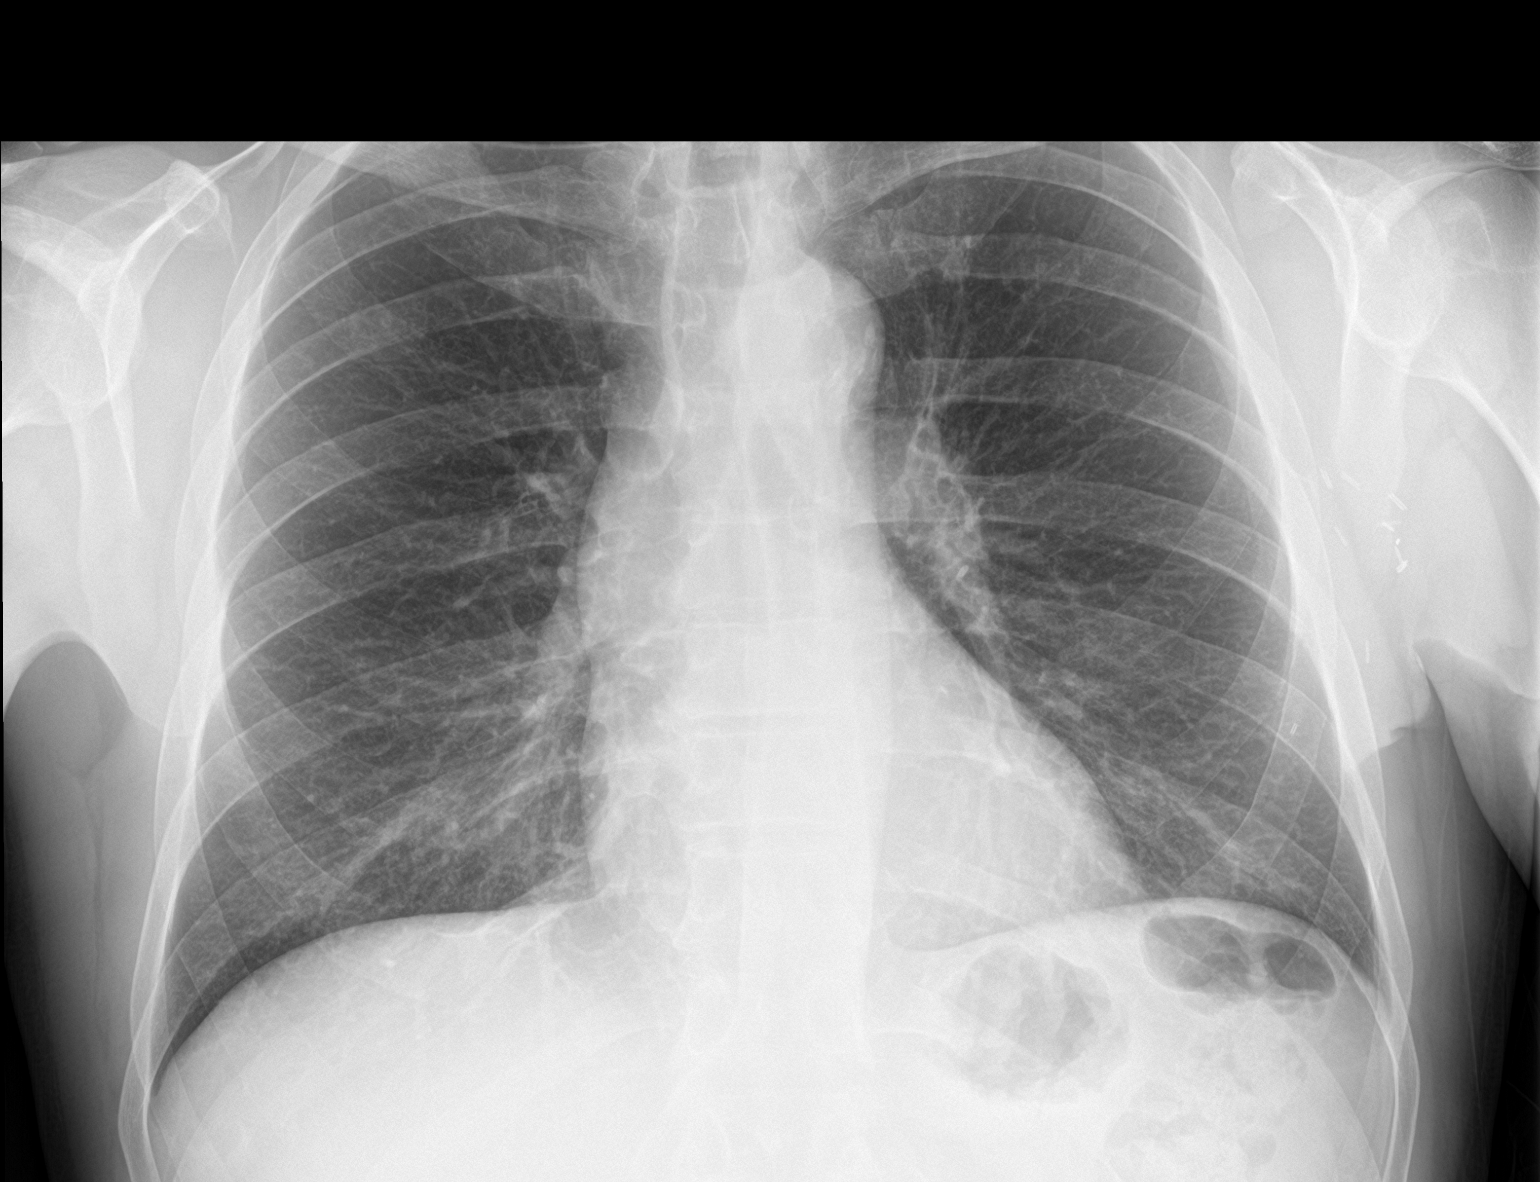

[chest lat]
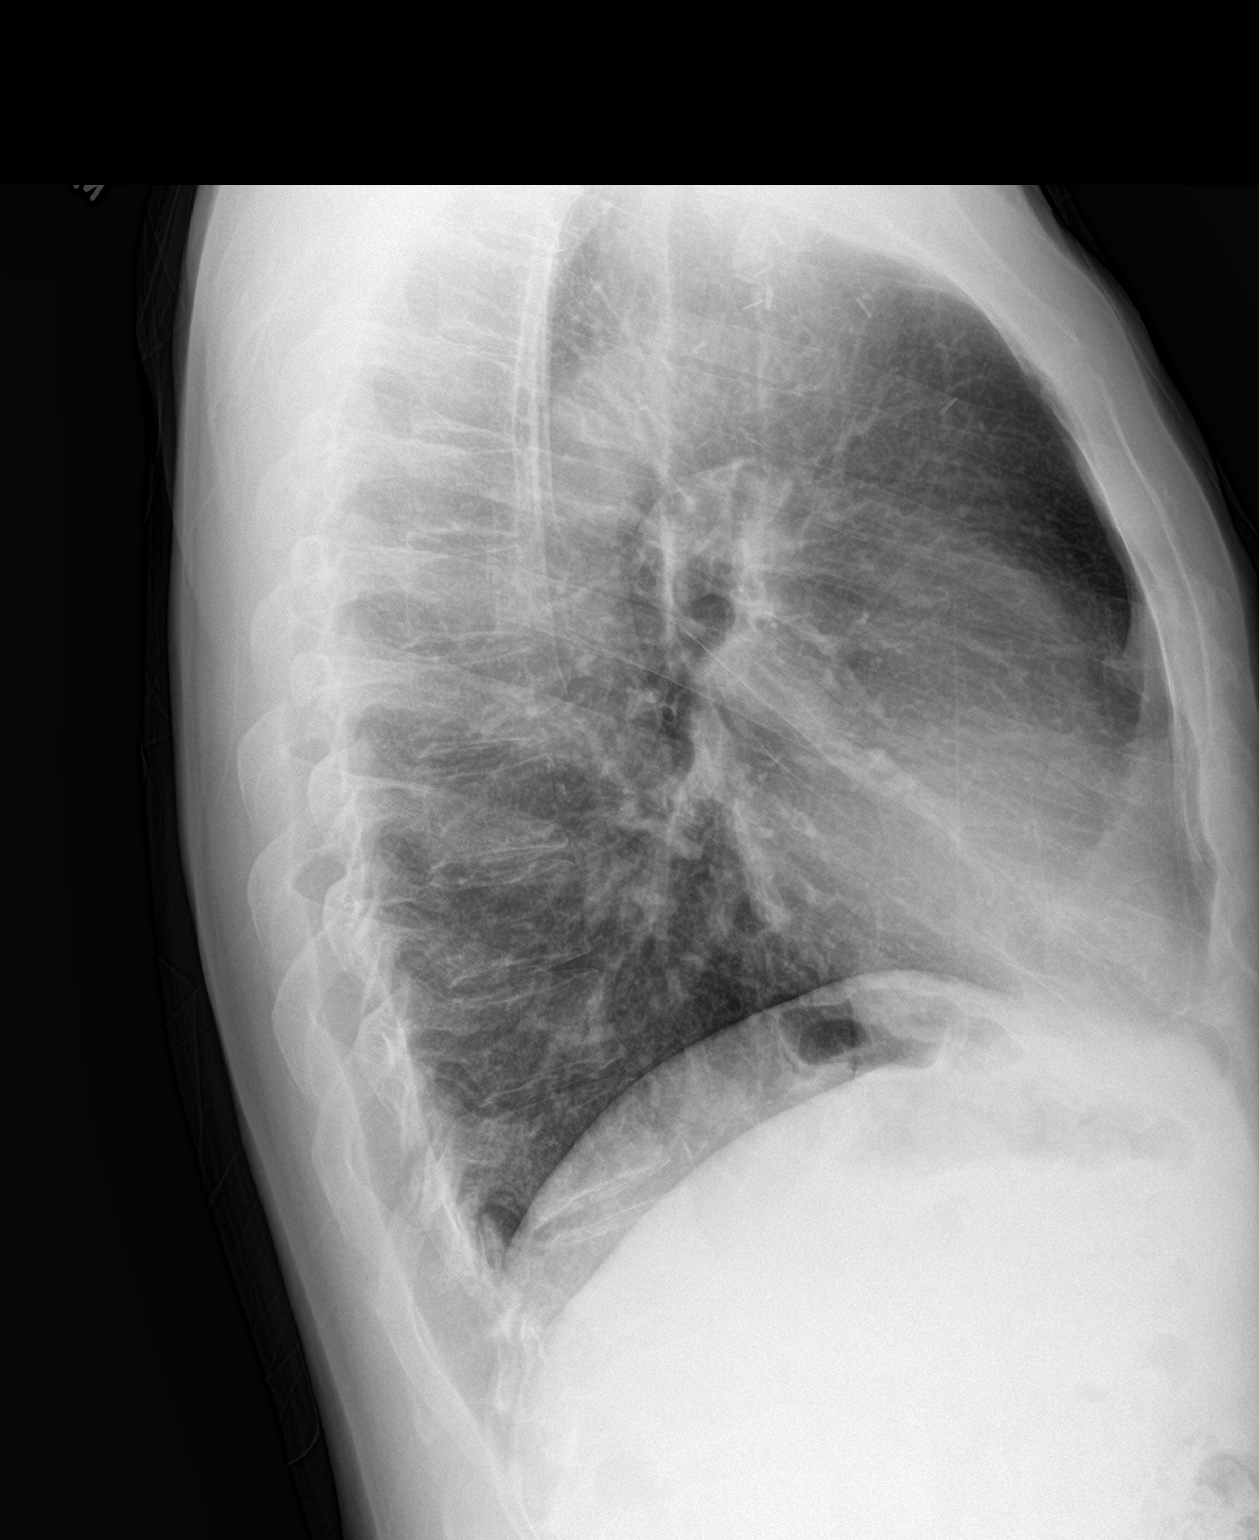

[2 of 2 positions shown; findings below may reference images not displayed]

FINDINGS: The heart size and mediastinal contours are within normal limits.
Both lungs are clear. No pneumothorax or pleural effusion is noted.
The visualized skeletal structures are unremarkable.
IMPRESSION: No active cardiopulmonary disease.

## 2022-10-19 ENCOUNTER — Telehealth: Payer: Self-pay | Admitting: Emergency Medicine

## 2022-10-19 NOTE — Telephone Encounter (Signed)
Called patient to confirm upcoming New patient appt, VM left asking patient to CB and confirm or deny.

## 2022-10-31 ENCOUNTER — Ambulatory Visit: Payer: Medicaid Other | Attending: Internal Medicine | Admitting: Internal Medicine

## 2022-10-31 ENCOUNTER — Encounter: Payer: Self-pay | Admitting: Internal Medicine

## 2022-10-31 ENCOUNTER — Ambulatory Visit
Admission: RE | Admit: 2022-10-31 | Discharge: 2022-10-31 | Disposition: A | Discharge status: Home / Self Care | Payer: Medicaid Other | Source: Ambulatory Visit | Attending: Internal Medicine | Admitting: Internal Medicine

## 2022-10-31 VITALS — BP 120/81 | HR 67 | Ht 72.0 in | Wt 226.0 lb

## 2022-10-31 DIAGNOSIS — Z125 Encounter for screening for malignant neoplasm of prostate: Secondary | ICD-10-CM

## 2022-10-31 DIAGNOSIS — Z860100 Personal history of colon polyps, unspecified: Secondary | ICD-10-CM | POA: Insufficient documentation

## 2022-10-31 DIAGNOSIS — G8929 Other chronic pain: Secondary | ICD-10-CM

## 2022-10-31 DIAGNOSIS — E049 Nontoxic goiter, unspecified: Secondary | ICD-10-CM

## 2022-10-31 DIAGNOSIS — M25562 Pain in left knee: Secondary | ICD-10-CM

## 2022-10-31 DIAGNOSIS — Z7689 Persons encountering health services in other specified circumstances: Secondary | ICD-10-CM

## 2022-10-31 DIAGNOSIS — Z23 Encounter for immunization: Secondary | ICD-10-CM | POA: Diagnosis not present

## 2022-10-31 DIAGNOSIS — Z1211 Encounter for screening for malignant neoplasm of colon: Secondary | ICD-10-CM

## 2022-10-31 DIAGNOSIS — Z87820 Personal history of traumatic brain injury: Secondary | ICD-10-CM

## 2022-10-31 DIAGNOSIS — E785 Hyperlipidemia, unspecified: Secondary | ICD-10-CM | POA: Insufficient documentation

## 2022-10-31 DIAGNOSIS — H9192 Unspecified hearing loss, left ear: Secondary | ICD-10-CM

## 2022-10-31 DIAGNOSIS — R600 Localized edema: Secondary | ICD-10-CM | POA: Diagnosis not present

## 2022-10-31 DIAGNOSIS — R0609 Other forms of dyspnea: Secondary | ICD-10-CM

## 2022-10-31 DIAGNOSIS — E039 Hypothyroidism, unspecified: Secondary | ICD-10-CM

## 2022-10-31 MED ORDER — FUROSEMIDE 20 MG PO TABS: 10.0000 mg | ORAL_TABLET | Freq: Every day | ORAL | 1 refills | Status: DC | PRN

## 2022-10-31 MED ORDER — CELECOXIB 200 MG PO CAPS: 200.0000 mg | ORAL_CAPSULE | Freq: Every day | ORAL | 1 refills | Status: DC

## 2022-10-31 NOTE — Progress Notes (Signed)
Patient ID: Samuel Luna, male    DOB: [birth year]  MRN: [id number]  CC: Establish Care (Establish Care / New pt./Feet swelling, pain in L & R knee. SOB when walking. Velta Addison to flu vax.)   Subjective: Samuel Luna is a 61 y.o. male who presents for new pt visit.  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  Last seen 2 yrs ago. Endorses hx of hypothyroid, off med for a while since he never went back for RF.  Wife reports he is always cold and low energy.   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  Did special ed school and graduated. Wife states his short term memory is poor but long term memory ok since she has known him for 18 yrs.    C/o shortness of breath when walking x several mths.  Walks 1/2 mile from his house to work 4-5 days a wk.  Sometimes he has to stop to catch his breath.  No CPs No SOB at rest or laying down.  Wife endorses swelling in his legs and feet No wheezing or chronic cough. Quit smoking 17 yrs ago.  Was 2-2.5 pks a day smoker.  Had started smoking as teenager to early 59s. So smoked for 20-25 yrs and quit 17 yrs ago.    C/o chronic pain LT knee for yrs.   Wife reports he was dx with arthritis yrs ago and had inj to both knees. Pain when he walks, stands too long.  Has to walk slow.   Endorses swelling in knee No known injury Ambulates with cane which he initially used for protection because the neighborhood is not safe; but now uses for walking   HL:  off Lipitor for few yrs  Reports some decreased hearing in the left ear x 3 months.  HM:  agrees to flu shot.  History of colon polyps.  Due for repeat colonoscopy.  Agreeable to prostate cancer screening. Patient Active Problem List   Diagnosis Date Noted   Brain injury  ([place])    HYPOTHYROIDISM 05/13/2008   HYPERCHOLESTEROLEMIA 05/13/2008   MILD COGNITIVE IMPAIRMENT SO STATED 05/12/2008   KNEE PAIN, LEFT 05/12/2008   SCOLIOSIS 05/12/2008   SKIN RASH 05/12/2008     Current Outpatient Medications on File Prior to Visit  Medication Sig Dispense Refill   allopurinol (ZYLOPRIM) 300 MG tablet Take 300 mg by mouth daily. (Patient not taking: Reported on 10/31/2022)  5   atorvastatin (LIPITOR) 20 MG tablet Take 20 mg by mouth at bedtime. (Patient not taking: Reported on 10/31/2022)     famotidine (PEPCID) 20 MG tablet Take 1 tablet (20 mg total) by mouth at bedtime. (Patient not taking: Reported on 10/31/2022) 30 tablet 11   furosemide (LASIX) 20 MG tablet Take 20 mg by mouth daily as needed. (Patient not taking: Reported on 10/31/2022)     ibuprofen (ADVIL,MOTRIN) 200 MG tablet Take 200 mg by mouth as needed. (Patient not taking: Reported on 10/31/2022)     levothyroxine (SYNTHROID, LEVOTHROID) 150 MCG tablet Take 150 mcg by mouth daily. (Patient not taking: Reported on 10/31/2022)  5   meclizine (ANTIVERT) 12.5 MG tablet Take 12.5 mg by mouth 3 (three) times daily as needed. (Patient not taking: Reported on 10/31/2022)  5   meloxicam (  MOBIC) 15 MG tablet Take 15 mg by mouth daily. with food (Patient not taking: Reported on 10/31/2022)  5   naproxen (NAPROSYN) 500 MG tablet Take 1 tablet (500 mg total) by mouth 2 (two) times daily with a meal. (Patient not taking: Reported on 10/31/2022) 30 tablet 0   omeprazole (PRILOSEC) 40 MG capsule Take 1 capsule (40 mg total) by mouth 2 (two) times daily. 40 mg twice daily shortly before breakfast and dinner. (Patient not taking: Reported on 10/31/2022) 60 capsule 6   ondansetron (ZOFRAN) 4 MG tablet TAKE 1 TABLET BY MOUTH 2 TIMES DAILY. (Patient not taking: Reported on 10/31/2022) 60 tablet 3   prochlorperazine (COMPAZINE) 10 MG tablet Take 1 tablet (10 mg total) by mouth 2 (two) times daily as needed for nausea or vomiting.  (Patient not taking: Reported on 10/31/2022) 10 tablet 0   Vitamin D, Ergocalciferol, (DRISDOL) 1.25 MG (50000 UNIT) CAPS capsule Take 50,000 Units by mouth once a week. (Patient not taking: Reported on 10/31/2022)     Current Facility-Administered Medications on File Prior to Visit  Medication Dose Route Frequency Provider Last Rate Last Admin   0.9 %  sodium chloride infusion  500 mL Intravenous Once Milus Banister, MD       0.9 %  sodium chloride infusion  500 mL Intravenous Once Milus Banister, MD        No Known Allergies  Social History   Socioeconomic History   Marital status: Married    Spouse name: Not on file   Number of children: Not on file   Years of education: Not on file   Highest education level: Not on file  Occupational History   Not on file  Tobacco Use   Smoking status: Former   Smokeless tobacco: Never   Tobacco comments:    Quit 12 years ago.   Substance and Sexual Activity   Alcohol use: Yes    Alcohol/week: 0.0 standard drinks of alcohol    Comment: occ   Drug use: No   Sexual activity: Not on file  Other Topics Concern   Not on file  Social History Narrative   Not on file   Social Determinants of Health   Financial Resource Strain: Not on file  Food Insecurity: Not on file  Transportation Needs: Not on file  Physical Activity: Not on file  Stress: Not on file  Social Connections: Not on file  Intimate Partner Violence: Not on file    Family History  Problem Relation Age of Onset   Colon cancer Neg Hx    Esophageal cancer Neg Hx    Liver cancer Neg Hx    Pancreatic cancer Neg Hx    Rectal cancer Neg Hx    Stomach cancer Neg Hx     Past Surgical History:  Procedure Laterality Date   LYMPHADENECTOMY Left 2016   2 lymph nodes left arm    ROS: Review of Systems Negative except as stated above  PHYSICAL EXAM: BP 120/81 (BP Location: Left Arm, Patient Position: Sitting, Cuff Size: Normal)   Pulse 67   Ht 6' (1.829 m)   Wt  226 lb (102.5 kg)   SpO2 97%   BMI 30.65 kg/m   Physical Exam   General appearance - alert, well appearing, older caucasian male and in no distress Mental status -patient speech is slow and deliberate and a little garbled.  He requires help from his wife and answering some questions as he does not remember  Eyes - pupils equal and reactive, extraocular eye movements intact Ears -small amount of dried wax in both ear canals but not obscuring the eardrum.  Tympanic membranes within normal limits. Mouth - mucous membranes moist, pharynx normal without lesions Neck -mild enlargement of thyroid gland on the right side.  No nodules appreciated.  No cervical lymphadenopathy. Chest - clear to auscultation, no wheezes, rales or rhonchi, symmetric air entry Heart - normal rate, regular rhythm, normal S1, S2, no murmurs, rubs, clicks or gallops.  No JVD Musculoskeletal -knees: Mild enlargement of both knee joints left greater than right.  Some edema of the soft tissue of the left knee.  No point tenderness.  No crepitus on passive range of motion.  Good range of motion of both knees. Extremities -trace lower extremity edema left greater than right.  Some varicose veins in the lower extremities.  No pedal edema.     Latest Ref Rng & Units 07/30/2020    2:00 PM 03/16/2015    5:11 PM 03/16/2015    1:37 PM  CMP  Glucose 70 - 99 mg/dL 99  81  88   BUN 6 - 20 mg/dL '11  28  18   '$ Creatinine 0.61 - 1.24 mg/dL 1.32  1.60  1.67   Sodium 135 - 145 mmol/L 139  138  140   Potassium 3.5 - 5.1 mmol/L 3.8  5.7  4.0   Chloride 98 - 111 mmol/L 103  102  103   CO2 22 - 32 mmol/L 25   28   Calcium 8.9 - 10.3 mg/dL 9.6   8.9   Total Protein 6.5 - 8.1 g/dL 6.9   5.9   Total Bilirubin 0.3 - 1.2 mg/dL 0.5   1.0   Alkaline Phos 38 - 126 U/L 85   53   AST 15 - 41 U/L 31   69   ALT 0 - 44 U/L 22   26    Lipid Panel     Component Value Date/Time   CHOL 167 12/15/2008 2222   TRIG 136 12/15/2008 2222   HDL 39 (L)  12/15/2008 2222   CHOLHDL 4.3 Ratio 12/15/2008 2222   VLDL 27 12/15/2008 2222   LDLCALC 101 (H) 12/15/2008 2222    CBC    Component Value Date/Time   WBC 7.9 07/30/2020 1400   RBC 5.12 07/30/2020 1400   HGB 15.9 07/30/2020 1400   HCT 47.6 07/30/2020 1400   PLT 195 07/30/2020 1400   MCV 93.0 07/30/2020 1400   MCH 31.1 07/30/2020 1400   MCHC 33.4 07/30/2020 1400   RDW 14.1 07/30/2020 1400   LYMPHSABS 1.1 07/30/2020 1400   MONOABS 0.5 07/30/2020 1400   EOSABS 0.1 07/30/2020 1400   BASOSABS 0.0 07/30/2020 1400    ASSESSMENT AND PLAN: 1. Establishing care with new doctor, encounter for  2. DOE (dyspnea on exertion) Differential diagnoses include CHF, COPD, deconditioning.  We will check some baseline labs and get a chest x-ray.  Will also obtain an echo to evaluate heart function. - CBC - Comprehensive metabolic panel - Brain natriuretic peptide  3. Edema of both legs Check BNP and thyroid level. Start low-dose furosemide to use as needed. - furosemide (LASIX) 20 MG tablet; Take 0.5 tablets (10 mg total) by mouth daily as needed.  Dispense: 30 tablet; Refill: 1  4. Hypothyroidism, unspecified type - TSH+T4F+T3Free  5. Thyroid enlarged - US THYROID; Future  6. Chronic pain of left knee Likely arthritis.  Start Celebrex.  Get x-ray of the knee. - DG Knee Complete 4 Views Left; Future - celecoxib (CELEBREX) 200 MG capsule; Take 1 capsule (200 mg total) by mouth daily.  Dispense: 30 capsule; Refill: 1  7. Hyperlipidemia, unspecified hyperlipidemia type - Lipid panel  8. Decreased hearing of left ear ENT referral  9. History of traumatic brain injury   10. History of colon polyps - Ambulatory referral to Gastroenterology  11. Screening for colon cancer - Ambulatory referral to Gastroenterology  12. Prostate cancer screening Patient agreeable to prostate cancer screening with PSA level. - PSA  13. Need for immunization against influenza - Flu Vaccine QUAD  8moIM (Fluarix, Fluzone & Alfiuria Quad PF)     Patient was given the opportunity to ask questions.  Patient verbalized understanding of the plan and was able to repeat key elements of the plan.   This documentation was completed using DRadio producer  Any transcriptional errors are unintentional.  No orders of the defined types were placed in this encounter.    Requested Prescriptions    No prescriptions requested or ordered in this encounter    No follow-ups on file.  DKarle Plumber MD, FACP

## 2022-11-01 ENCOUNTER — Other Ambulatory Visit: Payer: Self-pay | Admitting: Internal Medicine

## 2022-11-01 ENCOUNTER — Telehealth: Payer: Self-pay | Admitting: Internal Medicine

## 2022-11-01 DIAGNOSIS — R7989 Other specified abnormal findings of blood chemistry: Secondary | ICD-10-CM

## 2022-11-01 DIAGNOSIS — N1832 Chronic kidney disease, stage 3b: Secondary | ICD-10-CM

## 2022-11-01 MED ORDER — LEVOTHYROXINE SODIUM 100 MCG PO TABS: 150.0000 ug | ORAL_TABLET | Freq: Every day | ORAL | 3 refills | Status: DC

## 2022-11-01 MED ORDER — DICLOFENAC SODIUM 1 % EX GEL: 2.0000 g | Freq: Two times a day (BID) | CUTANEOUS | 1 refills | Status: DC | PRN

## 2022-11-01 NOTE — Telephone Encounter (Signed)
Phone call placed to patient and his wife this morning to go over lab results. -CKD: Inform kidney function is not 100% and looks like it has been this way dating back to 2016 looking at his GFR's and creatinine.  GFR in the past has varied from 43-59.  Current GFR 40.  Inquired about use of NSAIDs.  Patient and wife confirms that he has been taking Naprosyn and sometimes ibuprofen on a regular basis due to knee pain.  Advised to discontinue all over-the-counter NSAIDs.  Also advised to hold off on taking Celebrex that was prescribed yesterday.  Will send prescription for Voltaren gel instead.  May also benefit from Cymbalta but will hold off for now until abnormal LFT is addressed. -Hypothyroidism: Thyroid level consistent with untreated hypothyroid.  Restart levothyroxine at 100 mcg daily.  Went over how to take the medicine first thing in the morning by itself.  We will plan to recheck thyroid level when I see him in January. -HL: Significantly elevated total and LDL cholesterol suggesting familial hyperlipidemia.  Ideally we should start him on statin therapy but AST is elevated.  Will hold off on starting until we have workup liver function. Abnormal AST: AST level is 105.  I inquired about EtOH use.  Patient wife tells me that he drinks an alcoholic beverage that is 8% ETOH/vol called Twisted Tea 24 ounce 2-3/night.  Advised that he is drinking too much and recommend cutting back to no more than one of these per night or one every other night.  Pt expressed understanding.  Request return to lab end of next wk for blood test to include recheck LFTs, hep B/C screening, urine microalbumin. All questions answered. Results for orders placed or performed in visit on 10/31/22  CBC  Result Value Ref Range   WBC 5.8 3.4 - 10.8 x10E3/uL   RBC 5.03 4.14 - 5.80 x10E6/uL   Hemoglobin 15.3 13.0 - 17.7 g/dL   Hematocrit 47.2 37.5 - 51.0 %   MCV 94 79 - 97 fL   MCH 30.4 26.6 - 33.0 pg   MCHC 32.4 31.5 - 35.7  g/dL   RDW 13.4 11.6 - 15.4 %   Platelets 153 150 - 450 x10E3/uL  Comprehensive metabolic panel  Result Value Ref Range   Glucose 69 (L) 70 - 99 mg/dL   BUN 17 8 - 27 mg/dL   Creatinine, Ser 1.90 (H) 0.76 - 1.27 mg/dL   eGFR 40 (L) >59 mL/min/1.73   BUN/Creatinine Ratio 9 (L) 10 - 24   Sodium 136 134 - 144 mmol/L   Potassium 4.2 3.5 - 5.2 mmol/L   Chloride 94 (L) 96 - 106 mmol/L   CO2 26 20 - 29 mmol/L   Calcium 10.4 (H) 8.6 - 10.2 mg/dL   Total Protein 8.0 6.0 - 8.5 g/dL   Albumin 5.2 (H) 3.8 - 4.9 g/dL   Globulin, Total 2.8 1.5 - 4.5 g/dL   Albumin/Globulin Ratio 1.9 1.2 - 2.2   Bilirubin Total 0.9 0.0 - 1.2 mg/dL   Alkaline Phosphatase 74 44 - 121 IU/L   AST 105 (H) 0 - 40 IU/L   ALT 37 0 - 44 IU/L  Lipid panel  Result Value Ref Range   Cholesterol, Total 403 (H) 100 - 199 mg/dL   Triglycerides 166 (H) 0 - 149 mg/dL   HDL 57 >39 mg/dL   VLDL Cholesterol Cal 34 5 - 40 mg/dL   LDL Chol Calc (NIH) 312 (H) 0 - 99 mg/dL  Lipid Comment: Comment    Chol/HDL Ratio 7.1 (H) 0.0 - 5.0 ratio  TSH+T4F+T3Free  Result Value Ref Range   TSH 162.000 (H) 0.450 - 4.500 uIU/mL   T3, Free <0.3 (L) 2.0 - 4.4 pg/mL   Free T4 WILL FOLLOW   PSA  Result Value Ref Range   Prostate Specific Ag, Serum 0.6 0.0 - 4.0 ng/mL  Brain natriuretic peptide  Result Value Ref Range   BNP WILL FOLLOW

## 2022-11-02 ENCOUNTER — Other Ambulatory Visit: Payer: Self-pay | Admitting: Internal Medicine

## 2022-11-02 DIAGNOSIS — G8929 Other chronic pain: Secondary | ICD-10-CM

## 2022-11-03 LAB — TSH+T4F+T3FREE
Free T4: 0.1 ng/dL — ABNORMAL LOW (ref 0.82–1.77)
T3, Free: <0.3 pg/mL — ABNORMAL LOW (ref 2.0–4.4)
TSH: 162 u[IU]/mL — ABNORMAL HIGH (ref 0.450–4.500)

## 2022-11-03 LAB — COMPREHENSIVE METABOLIC PANEL
ALT: 37 IU/L (ref 0–44)
AST: 105 IU/L — ABNORMAL HIGH (ref 0–40)
Albumin/Globulin Ratio: 1.9 (ref 1.2–2.2)
Albumin: 5.2 g/dL — ABNORMAL HIGH (ref 3.8–4.9)
Alkaline Phosphatase: 74 IU/L (ref 44–121)
BUN/Creatinine Ratio: 9 — ABNORMAL LOW (ref 10–24)
BUN: 17 mg/dL (ref 8–27)
Bilirubin Total: 0.9 mg/dL (ref 0.0–1.2)
CO2: 26 mmol/L (ref 20–29)
Calcium: 10.4 mg/dL — ABNORMAL HIGH (ref 8.6–10.2)
Chloride: 94 mmol/L — ABNORMAL LOW (ref 96–106)
Creatinine, Ser: 1.9 mg/dL — ABNORMAL HIGH (ref 0.76–1.27)
Globulin, Total: 2.8 g/dL (ref 1.5–4.5)
Glucose: 69 mg/dL — ABNORMAL LOW (ref 70–99)
Potassium: 4.2 mmol/L (ref 3.5–5.2)
Sodium: 136 mmol/L (ref 134–144)
Total Protein: 8 g/dL (ref 6.0–8.5)
eGFR: 40 mL/min/{1.73_m2} — ABNORMAL LOW (ref >59)

## 2022-11-03 LAB — CBC
Hematocrit: 47.2 % (ref 37.5–51.0)
Hemoglobin: 15.3 g/dL (ref 13.0–17.7)
MCH: 30.4 pg (ref 26.6–33.0)
MCHC: 32.4 g/dL (ref 31.5–35.7)
MCV: 94 fL (ref 79–97)
Platelets: 153 10*3/uL (ref 150–450)
RBC: 5.03 x10E6/uL (ref 4.14–5.80)
RDW: 13.4 % (ref 11.6–15.4)
WBC: 5.8 10*3/uL (ref 3.4–10.8)

## 2022-11-03 LAB — LIPID PANEL
Chol/HDL Ratio: 7.1 ratio — ABNORMAL HIGH (ref 0.0–5.0)
Cholesterol, Total: 403 mg/dL — ABNORMAL HIGH (ref 100–199)
HDL: 57 mg/dL (ref >39)
LDL Chol Calc (NIH): 312 mg/dL — ABNORMAL HIGH (ref 0–99)
Triglycerides: 166 mg/dL — ABNORMAL HIGH (ref 0–149)
VLDL Cholesterol Cal: 34 mg/dL (ref 5–40)

## 2022-11-03 LAB — PSA: Prostate Specific Ag, Serum: 0.6 ng/mL (ref 0.0–4.0)

## 2022-11-03 LAB — BRAIN NATRIURETIC PEPTIDE: BNP: 39.3 pg/mL (ref 0.0–100.0)

## 2022-11-07 ENCOUNTER — Ambulatory Visit
Admission: RE | Admit: 2022-11-07 | Discharge: 2022-11-07 | Disposition: A | Discharge status: Home / Self Care | Payer: Medicaid Other | Source: Ambulatory Visit | Attending: Internal Medicine | Admitting: Internal Medicine

## 2022-11-07 ENCOUNTER — Other Ambulatory Visit: Payer: Self-pay

## 2022-11-07 ENCOUNTER — Encounter: Payer: Self-pay | Admitting: Surgery

## 2022-11-07 ENCOUNTER — Ambulatory Visit (INDEPENDENT_AMBULATORY_CARE_PROVIDER_SITE_OTHER): Payer: Medicaid Other | Admitting: Surgery

## 2022-11-07 ENCOUNTER — Ambulatory Visit: Payer: Medicaid Other | Attending: Family Medicine

## 2022-11-07 DIAGNOSIS — R0609 Other forms of dyspnea: Secondary | ICD-10-CM

## 2022-11-07 DIAGNOSIS — N1832 Chronic kidney disease, stage 3b: Secondary | ICD-10-CM

## 2022-11-07 DIAGNOSIS — M1712 Unilateral primary osteoarthritis, left knee: Secondary | ICD-10-CM

## 2022-11-07 DIAGNOSIS — M25462 Effusion, left knee: Secondary | ICD-10-CM

## 2022-11-07 DIAGNOSIS — R7989 Other specified abnormal findings of blood chemistry: Secondary | ICD-10-CM

## 2022-11-07 NOTE — Addendum Note (Signed)
Addended by: Shelle Iron on: 11/07/2022 03:15 PM   Modules accepted: Orders

## 2022-11-07 NOTE — Progress Notes (Signed)
Office Visit Note   Patient: Samuel Luna           Date of Birth: 1961-08-23           MRN: [id number] Visit Date: 11/07/2022              Requested by: [name], MD [address] [place],  [place] [address] PCP: [name], MD   Assessment & Plan: Visit Diagnoses:  1. Arthritis of left knee   2. Effusion, left knee     Plan: In hopes given patient improvement of his knee pain offered aspiration and injection.  After patient consent left knee was aspirated and intra-articular Marcaine/Depo-Medrol injection was performed.  Follow-up with Dr. Lorin Mercy in 3 weeks for recheck.  May consider getting MRI.  Follow-Up Instructions: Return in about 3 weeks (around 11/28/2022) for WITH DR YATES FOR RECHECK LEFT KNEE AFTER INJECTION.   Orders:  No orders of the defined types were placed in this encounter.  No orders of the defined types were placed in this encounter.     Procedures: No procedures performed   Clinical Data: No additional findings.   Subjective: Chief Complaint  Patient presents with   Left Knee - Pain    HPI 61 year old white male who is new patient to clinic comes in with complaints of left knee pain and swelling.  States that he had off-and-on problems with left knee for several years but is worse over the last several months.  He has having pain and swelling with ambulation.  Does use a cane.  States he is also been told that he has arthritis in his right knee.  No injury.  No previous surgeries.  States that he did have left knee injection performed several years ago. Review of Systems No current complaints of cardiopulmonary GI/GU issues  Objective: Vital Signs: There were no vitals taken for this visit.  Physical Exam HENT:     Head: Normocephalic.  Eyes:     Extraocular Movements: Extraocular movements intact.  Pulmonary:     Effort: No respiratory distress.  Musculoskeletal:     Comments: Gait is antalgic with a cane.   Left knee he does have some swelling with moderate size effusion.  Medial lateral joint line tenderness.  Positive patellofemoral crepitus.  Ligaments are stable.  No signs of infection.  Neurological:     Mental Status: He is alert.  Psychiatric:        Mood and Affect: Mood normal.     Ortho Exam  Specialty Comments:  No specialty comments available.  Imaging: No results found.   PMFS History: Patient Active Problem List   Diagnosis Date Noted   Hyperlipidemia 10/31/2022   History of colon polyps 10/31/2022   Brain injury ([place])    Hypothyroidism 05/13/2008   HYPERCHOLESTEROLEMIA 05/13/2008   MILD COGNITIVE IMPAIRMENT SO STATED 05/12/2008   KNEE PAIN, LEFT 05/12/2008   SCOLIOSIS 05/12/2008   SKIN RASH 05/12/2008   Past Medical History:  Diagnosis Date   Arthritis    knees   Brain injury ([place])    Chronic kidney disease    mild   GERD (gastroesophageal reflux disease)    Scoliosis    Thyroid disease    hypothyroidism    Family History  Problem Relation Age of Onset   Colon cancer Neg Hx    Esophageal cancer Neg Hx    Liver cancer Neg Hx    Pancreatic cancer Neg Hx  Rectal cancer Neg Hx    Stomach cancer Neg Hx     Past Surgical History:  Procedure Laterality Date   LYMPHADENECTOMY Left 2016   2 lymph nodes left arm   Social History   Occupational History   Not on file  Tobacco Use   Smoking status: Former   Smokeless tobacco: Never   Tobacco comments:    Quit 12 years ago.   Substance and Sexual Activity   Alcohol use: Yes    Alcohol/week: 0.0 standard drinks of alcohol    Comment: occ   Drug use: No   Sexual activity: Not on file

## 2022-11-08 ENCOUNTER — Other Ambulatory Visit: Payer: Self-pay | Admitting: Internal Medicine

## 2022-11-08 DIAGNOSIS — N1832 Chronic kidney disease, stage 3b: Secondary | ICD-10-CM

## 2022-11-08 LAB — SYNOVIAL FLUID ANALYSIS, COMPLETE
Basophils, %: 0 %
Eosinophils-Synovial: 0 % (ref 0–2)
Lymphocytes-Synovial Fld: 20 % (ref 0–74)
Monocyte/Macrophage: 8 % (ref 0–69)
Neutrophil, Synovial: 72 % — ABNORMAL HIGH (ref 0–24)
Synoviocytes, %: 0 % (ref 0–15)
WBC, Synovial: 612 cells/uL — ABNORMAL HIGH (ref <150)

## 2022-11-08 LAB — GRAM STAIN
MICRO NUMBER:: 14272492
SPECIMEN QUALITY:: ADEQUATE

## 2022-11-08 NOTE — Progress Notes (Signed)
Patient and his wife know that his kidney function remains poor.  I will refer him to a kidney specialist for further evaluation. -Calcium level is elevated.  This could be due to his thyroid level being abnormal.  However other things can cause this as well.  I would like for him to return to the lab at his convenience to have additional blood test done to evaluate this. -His liver function tests has improved but still mildly elevated.  He should continue to limit alcohol consumption. -Screen for hepatitis B and C was negative.

## 2022-11-09 ENCOUNTER — Ambulatory Visit: Payer: Medicaid Other | Attending: Internal Medicine

## 2022-11-09 ENCOUNTER — Ambulatory Visit
Admission: RE | Admit: 2022-11-09 | Discharge: 2022-11-09 | Disposition: A | Discharge status: Home / Self Care | Payer: Medicaid Other | Source: Ambulatory Visit | Attending: Internal Medicine | Admitting: Internal Medicine

## 2022-11-09 DIAGNOSIS — E049 Nontoxic goiter, unspecified: Secondary | ICD-10-CM

## 2022-11-09 LAB — COMPREHENSIVE METABOLIC PANEL
ALT: 29 IU/L (ref 0–44)
AST: 74 IU/L — ABNORMAL HIGH (ref 0–40)
Albumin/Globulin Ratio: 2.3 — ABNORMAL HIGH (ref 1.2–2.2)
Albumin: 5.5 g/dL — ABNORMAL HIGH (ref 3.8–4.9)
Alkaline Phosphatase: 86 IU/L (ref 44–121)
BUN/Creatinine Ratio: 17 (ref 10–24)
BUN: 35 mg/dL — ABNORMAL HIGH (ref 8–27)
Bilirubin Total: 0.8 mg/dL (ref 0.0–1.2)
CO2: 28 mmol/L (ref 20–29)
Calcium: 10.9 mg/dL — ABNORMAL HIGH (ref 8.6–10.2)
Chloride: 92 mmol/L — ABNORMAL LOW (ref 96–106)
Creatinine, Ser: 2.09 mg/dL — ABNORMAL HIGH (ref 0.76–1.27)
Globulin, Total: 2.4 g/dL (ref 1.5–4.5)
Glucose: 87 mg/dL (ref 70–99)
Potassium: 4.2 mmol/L (ref 3.5–5.2)
Sodium: 139 mmol/L (ref 134–144)
Total Protein: 7.9 g/dL (ref 6.0–8.5)
eGFR: 36 mL/min/{1.73_m2} — ABNORMAL LOW (ref >59)

## 2022-11-09 LAB — HEPATITIS B SURFACE ANTIGEN: Hepatitis B Surface Ag: NEGATIVE

## 2022-11-09 LAB — HCV INTERPRETATION

## 2022-11-09 LAB — MICROALBUMIN / CREATININE URINE RATIO
Creatinine, Urine: 89.5 mg/dL
Microalb/Creat Ratio: 38 mg/g creat — ABNORMAL HIGH (ref 0–29)
Microalbumin, Urine: 33.6 ug/mL

## 2022-11-09 LAB — BODY FLUID CULTURE

## 2022-11-09 LAB — HCV AB W REFLEX TO QUANT PCR: HCV Ab: NONREACTIVE

## 2022-11-09 LAB — HEPATITIS B SURFACE ANTIBODY, QUANTITATIVE: Hepatitis B Surf Ab Quant: 21.5 m[IU]/mL (ref >9.9)

## 2022-11-11 ENCOUNTER — Other Ambulatory Visit: Payer: Self-pay | Admitting: Internal Medicine

## 2022-11-11 NOTE — Progress Notes (Signed)
Let patient and his wife know that I have received the results of some of his blood test that were ordered to workup the high calcium level.  Vitamin D level is low; this can cause high calcium levels.  I recommend taking vitamin D supplement 400 IU daily.  His insurance does not pay for this because it can be purchased over-the-counter. Please make sure to give his wife the dose to purchase OTC which is Vitamin 400 IU and take one daily. Parathyroid hormone level is normal.

## 2022-11-13 ENCOUNTER — Telehealth: Payer: Self-pay

## 2022-11-13 NOTE — Telephone Encounter (Signed)
-----   Message from [name], MD sent at 11/09/2022  9:04 PM EST ----- Let patient and his wife know that his thyroid ultrasound came back okay.  Chest x-ray also came back okay.

## 2022-11-13 NOTE — Telephone Encounter (Signed)
Pt was called and is aware of results, DOB was confirmed.  ?

## 2022-11-14 LAB — PE AND FLC, SERUM
A/G Ratio: 1.7 (ref 0.7–1.7)
Albumin ELP: 4.5 g/dL — ABNORMAL HIGH (ref 2.9–4.4)
Alpha 1: 0.2 g/dL (ref 0.0–0.4)
Alpha 2: 0.6 g/dL (ref 0.4–1.0)
Beta: 0.9 g/dL (ref 0.7–1.3)
Gamma Globulin: 1.1 g/dL (ref 0.4–1.8)
Globulin, Total: 2.7 g/dL (ref 2.2–3.9)
Ig Kappa Free Light Chain: 35.8 mg/L — ABNORMAL HIGH (ref 3.3–19.4)
Ig Lambda Free Light Chain: 16.3 mg/L (ref 5.7–26.3)
KAPPA/LAMBDA RATIO: 2.2 — ABNORMAL HIGH (ref 0.26–1.65)
Total Protein: 7.2 g/dL (ref 6.0–8.5)

## 2022-11-14 LAB — VITAMIN D 25 HYDROXY (VIT D DEFICIENCY, FRACTURES): Vit D, 25-Hydroxy: 22.7 ng/mL — ABNORMAL LOW (ref 30.0–100.0)

## 2022-11-14 LAB — PTH, INTACT AND CALCIUM
Calcium: 9.7 mg/dL (ref 8.6–10.2)
PTH: 50 pg/mL (ref 15–65)

## 2022-11-14 LAB — PTH-RELATED PEPTIDE: PTH-related peptide: <2 pmol/L

## 2022-12-05 ENCOUNTER — Ambulatory Visit (INDEPENDENT_AMBULATORY_CARE_PROVIDER_SITE_OTHER): Payer: Medicaid Other | Admitting: Orthopedic Surgery

## 2022-12-05 DIAGNOSIS — M1712 Unilateral primary osteoarthritis, left knee: Secondary | ICD-10-CM

## 2022-12-05 DIAGNOSIS — M25462 Effusion, left knee: Secondary | ICD-10-CM | POA: Diagnosis not present

## 2022-12-06 ENCOUNTER — Encounter: Payer: Self-pay | Admitting: Orthopedic Surgery

## 2022-12-06 NOTE — Progress Notes (Signed)
Office Visit Note   Patient: Samuel Luna           Date of Birth: 1960/12/29           MRN: [id number] Visit Date: 12/05/2022              Requested by: [name], MD [place],  [place] [address] PCP: [name], MD  Chief Complaint  Patient presents with   Left Knee - Follow-up    S/p asp/ injection with JO 11/07/2022      HPI: Patient is a 62 year old gentleman who is seen for initial evaluation and referral from [place].  He is status post a left knee aspiration and steroid injection December 5.  Patient states he has not had recurrent swelling and has not had pain since the injection.  Assessment & Plan: Visit Diagnoses:  1. Effusion, left knee   2. Arthritis of left knee     Plan: Patient will increase his activities as tolerated.  Follow-Up Instructions: Return if symptoms worsen or fail to improve.   Ortho Exam  Patient is alert, oriented, no adenopathy, well-dressed, normal affect, normal respiratory effort. Examination there is mild crepitation with range of motion of the left knee there is no pain with range of motion collaterals and cruciates are stable.  There is no cellulitis there is no effusion.  Review of his laboratory studies shows a normal synovial fluid.  Imaging: No results found. No images are attached to the encounter.  Labs: Lab Results  Component Value Date   REPTSTATUS 10/15/2007 FINAL 10/12/2007   GRAMSTAIN  10/12/2007    NO WBC SEEN NO SQUAMOUS EPITHELIAL CELLS SEEN FEW GRAM POSITIVE COCCI IN CLUSTERS   CULT  10/12/2007    ABUNDANT METHICILLIN RESISTANT STAPHYLOCOCCUS AUREUS Note: RIFAMPIN AND GENTAMICIN SHOULD NOT BE USED AS SINGLE DRUGS FOR TREATMENT OF STAPH INFECTIONS. CRITICAL RESULT CALLED TO, READ BACK BY AND VERIFIED WITH: TONY S 10/15/07 AT 900 AM BY [facility]   LABORGA METHICILLIN RESISTANT STAPHYLOCOCCUS AUREUS 10/12/2007     Lab Results  Component Value Date   ALBUMIN 5.5 (H)  11/07/2022   ALBUMIN 5.2 (H) 10/31/2022   ALBUMIN 4.1 07/30/2020    No results found for: "MG" Lab Results  Component Value Date   VD25OH 22.7 (L) 11/09/2022    No results found for: "PREALBUMIN"    Latest Ref Rng & Units 10/31/2022    3:01 PM 07/30/2020    2:00 PM 03/16/2015    5:11 PM  CBC EXTENDED  WBC 3.4 - 10.8 x10E3/uL 5.8  7.9    RBC 4.14 - 5.80 x10E6/uL 5.03  5.12    Hemoglobin 13.0 - 17.7 g/dL 15.3  15.9  17.0   HCT 37.5 - 51.0 % 47.2  47.6  50.0   Platelets 150 - 450 x10E3/uL 153  195    NEUT# 1.7 - 7.7 K/uL  6.1    Lymph# 0.7 - 4.0 K/uL  1.1       There is no height or weight on file to calculate BMI.  Orders:  No orders of the defined types were placed in this encounter.  No orders of the defined types were placed in this encounter.    Procedures: No procedures performed  Clinical Data: No additional findings.  ROS:  All other systems negative, except as noted in the HPI. Review of Systems  Objective: Vital Signs: There were no vitals taken for this visit.  Specialty Comments:  No specialty comments available.  PMFS History: Patient Active Problem List   Diagnosis Date Noted   Hyperlipidemia 10/31/2022   History of colon polyps 10/31/2022   Brain injury ([place])    Hypothyroidism 05/13/2008   HYPERCHOLESTEROLEMIA 05/13/2008   MILD COGNITIVE IMPAIRMENT SO STATED 05/12/2008   KNEE PAIN, LEFT 05/12/2008   SCOLIOSIS 05/12/2008   SKIN RASH 05/12/2008   Past Medical History:  Diagnosis Date   Arthritis    knees   Brain injury ([place])    Chronic kidney disease    mild   GERD (gastroesophageal reflux disease)    Scoliosis    Thyroid disease    hypothyroidism    Family History  Problem Relation Age of Onset   Colon cancer Neg Hx    Esophageal cancer Neg Hx    Liver cancer Neg Hx    Pancreatic cancer Neg Hx    Rectal cancer Neg Hx    Stomach cancer Neg Hx     Past Surgical History:  Procedure Laterality Date   LYMPHADENECTOMY Left  2016   2 lymph nodes left arm   Social History   Occupational History   Not on file  Tobacco Use   Smoking status: Former   Smokeless tobacco: Never   Tobacco comments:    Quit 12 years ago.   Substance and Sexual Activity   Alcohol use: Yes    Alcohol/week: 0.0 standard drinks of alcohol    Comment: occ   Drug use: No   Sexual activity: Not on file

## 2022-12-08 ENCOUNTER — Telehealth: Payer: Self-pay | Admitting: Internal Medicine

## 2022-12-08 NOTE — Telephone Encounter (Signed)
Kenney Houseman from [facility] states that she is needing authorization for the patient to get a echocardiogram. Pt is scheduled on 12/12/2022 for the echocardiogram.      Please advise:   Kenney Houseman Phone: [phone]  ext [phone]

## 2022-12-11 NOTE — Telephone Encounter (Signed)
Echo is not needed for this procedure.  Notified precert of this.

## 2022-12-12 ENCOUNTER — Ambulatory Visit (HOSPITAL_COMMUNITY)
Admission: RE | Admit: 2022-12-12 | Discharge: 2022-12-12 | Disposition: A | Discharge status: Home / Self Care | Payer: Medicaid Other | Source: Ambulatory Visit | Attending: Internal Medicine | Admitting: Internal Medicine

## 2022-12-12 DIAGNOSIS — I4891 Unspecified atrial fibrillation: Secondary | ICD-10-CM | POA: Diagnosis not present

## 2022-12-12 DIAGNOSIS — R600 Localized edema: Secondary | ICD-10-CM | POA: Insufficient documentation

## 2022-12-12 DIAGNOSIS — R0609 Other forms of dyspnea: Secondary | ICD-10-CM | POA: Diagnosis not present

## 2022-12-12 LAB — ECHOCARDIOGRAM COMPLETE
AR max vel: 3.72 cm2
AV Area VTI: 3.3 cm2
AV Area mean vel: 3.37 cm2
AV Mean grad: 2 mmHg
AV Peak grad: 3.9 mmHg
Ao pk vel: 0.99 m/s
Area-P 1/2: 4.04 cm2
S' Lateral: 4.2 cm

## 2022-12-12 NOTE — Progress Notes (Signed)
  Echocardiogram 2D Echocardiogram has been performed.  Samuel Luna 12/12/2022, 1:48 PM

## 2022-12-17 ENCOUNTER — Encounter: Payer: Self-pay | Admitting: Internal Medicine

## 2022-12-17 ENCOUNTER — Telehealth: Payer: Self-pay | Admitting: Internal Medicine

## 2022-12-17 DIAGNOSIS — E559 Vitamin D deficiency, unspecified: Secondary | ICD-10-CM | POA: Insufficient documentation

## 2022-12-17 DIAGNOSIS — R931 Abnormal findings on diagnostic imaging of heart and coronary circulation: Secondary | ICD-10-CM

## 2022-12-17 DIAGNOSIS — R0609 Other forms of dyspnea: Secondary | ICD-10-CM

## 2022-12-17 DIAGNOSIS — F1090 Alcohol use, unspecified, uncomplicated: Secondary | ICD-10-CM | POA: Insufficient documentation

## 2022-12-17 DIAGNOSIS — N1832 Chronic kidney disease, stage 3b: Secondary | ICD-10-CM | POA: Insufficient documentation

## 2022-12-17 MED ORDER — ALBUTEROL SULFATE HFA 108 (90 BASE) MCG/ACT IN AERS: 2.0000 | INHALATION_SPRAY | Freq: Four times a day (QID) | RESPIRATORY_TRACT | 2 refills | Status: DC | PRN

## 2022-12-17 NOTE — Telephone Encounter (Signed)
Phone call to patient and wife this afternoon to go over her results of the echo that he had done recently.  This revealed slight decrease in heart function of 50 to 55% with normal being 60 to 65%.  It also revealed some diastolic dysfunction and abnormal RWMA.  This study was done as part of the work up for [place].  Will refer to cardiology. Will also get PFTs as part of work up as pt was smoking up to 2 pks a day for 20 + yrs but did quit 17 yrs ago.  Will give a trail of Albuterol inhaler as well.

## 2022-12-22 ENCOUNTER — Other Ambulatory Visit: Payer: Self-pay | Admitting: Internal Medicine

## 2022-12-22 DIAGNOSIS — M25562 Pain in left knee: Secondary | ICD-10-CM

## 2022-12-22 NOTE — Telephone Encounter (Signed)
PFT has been scheduled.Pt has been informed.

## 2022-12-22 NOTE — Telephone Encounter (Signed)
Requested medication (s) are due for refill today: yes  Requested medication (s) are on the active medication list: yes  Last refill:  11/01/22 #100 g 1 refills  Future visit scheduled: yes in 1 week  Notes to clinic:  do you want to continue refills?      Requested Prescriptions  Pending Prescriptions Disp Refills   diclofenac Sodium (VOLTAREN) 1 % GEL [Pharmacy Med Name: DICLOFENAC SODIUM 1% GEL] 100 g 1    Sig: Apply 2 g topically 2 (two) times daily as needed.     Analgesics:  Topicals Failed - 12/22/2022  1:26 AM      Failed - Manual Review: Labs are only required if the patient has taken medication for more than 8 weeks.      Failed - Cr in normal range and within 360 days    Creatinine, Ser  Date Value Ref Range Status  11/07/2022 2.09 (H) 0.76 - 1.27 mg/dL Final         Passed - PLT in normal range and within 360 days    Platelets  Date Value Ref Range Status  10/31/2022 153 150 - 450 x10E3/uL Final         Passed - HGB in normal range and within 360 days    Hemoglobin  Date Value Ref Range Status  10/31/2022 15.3 13.0 - 17.7 g/dL Final         Passed - HCT in normal range and within 360 days    Hematocrit  Date Value Ref Range Status  10/31/2022 47.2 37.5 - 51.0 % Final         Passed - eGFR is 30 or above and within 360 days    GFR calc Af Amer  Date Value Ref Range Status  07/30/2020 >60 >60 mL/min Final   GFR calc non Af Amer  Date Value Ref Range Status  07/30/2020 59 (L) >60 mL/min Final   eGFR  Date Value Ref Range Status  11/07/2022 36 (L) >59 mL/min/1.73 Final         Passed - Patient is not pregnant      Passed - Valid encounter within last 12 months    Recent Outpatient Visits           1 month ago Establishing care with new doctor, encounter for   [place], MD       Future Appointments             In 1 week [name], MD [place]

## 2022-12-25 ENCOUNTER — Ambulatory Visit (HOSPITAL_COMMUNITY)
Admission: RE | Admit: 2022-12-25 | Discharge: 2022-12-25 | Disposition: A | Discharge status: Home / Self Care | Payer: Medicaid Other | Source: Ambulatory Visit | Attending: Internal Medicine | Admitting: Internal Medicine

## 2022-12-25 DIAGNOSIS — R0609 Other forms of dyspnea: Secondary | ICD-10-CM | POA: Diagnosis not present

## 2022-12-25 LAB — PULMONARY FUNCTION TEST
DL/VA % pred: 89 %
DL/VA: 3.76 ml/min/mmHg/L
DLCO unc % pred: 85 %
DLCO unc: 25.08 ml/min/mmHg
FEF 25-75 Post: 3.12 L/sec
FEF 25-75 Pre: 2.64 L/sec
FEF2575-%Change-Post: 18 %
FEF2575-%Pred-Post: 98 %
FEF2575-%Pred-Pre: 83 %
FEV1-%Change-Post: 7 %
FEV1-%Pred-Post: 88 %
FEV1-%Pred-Pre: 82 %
FEV1-Post: 3.41 L
FEV1-Pre: 3.18 L
FEV1FVC-%Change-Post: 4 %
FEV1FVC-%Pred-Pre: 95 %
FEV6-%Change-Post: 2 %
FEV6-%Pred-Post: 92 %
FEV6-%Pred-Pre: 89 %
FEV6-Post: 4.52 L
FEV6-Pre: 4.39 L
FEV6FVC-%Change-Post: 0 %
FEV6FVC-%Pred-Post: 104 %
FEV6FVC-%Pred-Pre: 104 %
FVC-%Change-Post: 2 %
FVC-%Pred-Post: 88 %
FVC-%Pred-Pre: 85 %
FVC-Post: 4.52 L
FVC-Pre: 4.39 L
Post FEV1/FVC ratio: 75 %
Post FEV6/FVC ratio: 100 %
Pre FEV1/FVC ratio: 73 %
Pre FEV6/FVC Ratio: 100 %
RV % pred: 123 %
RV: 2.91 L
TLC % pred: 101 %
TLC: 7.53 L

## 2022-12-25 MED ORDER — ALBUTEROL SULFATE (2.5 MG/3ML) 0.083% IN NEBU
2.5000 mg | INHALATION_SOLUTION | Freq: Once | RESPIRATORY_TRACT | Status: AC
  Administered 2022-12-25: 2.5 mg via RESPIRATORY_TRACT

## 2022-12-27 NOTE — Addendum Note (Signed)
Addended by: Karle Plumber B on: 12/27/2022 10:26 AM   Modules accepted: Orders

## 2023-01-01 ENCOUNTER — Encounter: Payer: Self-pay | Admitting: Internal Medicine

## 2023-01-01 ENCOUNTER — Ambulatory Visit: Payer: Medicaid Other | Attending: Internal Medicine | Admitting: Internal Medicine

## 2023-01-01 VITALS — BP 120/80 | HR 66 | Temp 97.7°F | Ht 72.0 in | Wt 208.0 lb

## 2023-01-01 DIAGNOSIS — R7989 Other specified abnormal findings of blood chemistry: Secondary | ICD-10-CM

## 2023-01-01 DIAGNOSIS — E039 Hypothyroidism, unspecified: Secondary | ICD-10-CM

## 2023-01-01 DIAGNOSIS — N1832 Chronic kidney disease, stage 3b: Secondary | ICD-10-CM

## 2023-01-01 DIAGNOSIS — R0609 Other forms of dyspnea: Secondary | ICD-10-CM

## 2023-01-01 DIAGNOSIS — E7801 Familial hypercholesterolemia: Secondary | ICD-10-CM | POA: Diagnosis not present

## 2023-01-01 NOTE — Progress Notes (Signed)
Patient ID: Samuel Luna, male    DOB: 05-Apr-1961  MRN: [id number]  CC: Hypothyroidism (F/u. Med refill. /)   Subjective: Samuel Luna is a 62 y.o. male who presents for chronic ds management.  His wife is with him. 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  At that time his main concerns were dyspnea on exertion, left knee pain, left knee pain and swelling and being off thyroid medication for a while.  Thyroid: TSH was found to be 162.  Restarted on levothyroxine 150 mcg which she has been taking consistently.  Left knee pain: Saw Dr. Sharol Given and had fluid withdrawn from the knee and given steroid injection.  States that pain has significantly decreased since the injection.  We had him hold off on starting Celebrex due to poor kidney function.  Dyspnea on exertion: We did PFTs.  This showed mild obstructive airway disease.  Question of weak effort versus effects of upper airway obstruction.  Patient was placed on albuterol.  He has used it about 3 times since I prescribed it and finds it helpful.  I had also put him on low-dose of furosemide to use as needed.  He also found this helpful. -BNP was normal -Also had echo that revealed EF of 50 to 55% with left ventricular regional wall motion abnormalities and mild grade 1 diastolic dysfunction.  Referred to cardiology.  Found to have elevated calcium level: No M spike on SPEP.  Mild elevation in light chains.  Vitamin D level mildly low at 22.7.  Advised to take vitamin D supplement over-the-counter daily which she has been doing.  Repeat calcium level and parathyroid hormone levels normal. rPTH normal.  Found to have significant elevated total and LDL cholesterol.  LDL cholesterol was over 300.  We held off on statin therapy due to elevated  AST.  Kidney function was not 100%.  Patient was advised to stop NSAIDs which she was taking over-the-counter.  Last GFR was 36.  We have referred him to nephrology.  Awaits appointment.  He had mild elevation in AST of 105.  Patient was drinking alcoholic beverages daily.  He was advised to cut back and has done so.  Now he drinks about once or twice a week.   Patient Active Problem List   Diagnosis Date Noted   Stage 3b chronic kidney disease (CKD) ([place]) 12/17/2022   Vitamin D insufficiency 12/17/2022   Alcohol use disorder 12/17/2022   Hyperlipidemia 10/31/2022   History of colon polyps 10/31/2022   Brain injury (Samuel Luna)    Hypothyroidism 05/13/2008   HYPERCHOLESTEROLEMIA 05/13/2008   MILD COGNITIVE IMPAIRMENT SO STATED 05/12/2008   KNEE PAIN, LEFT 05/12/2008   SCOLIOSIS 05/12/2008   SKIN RASH 05/12/2008     Current Outpatient Medications on File Prior to Visit  Medication Sig Dispense Refill   albuterol (VENTOLIN HFA) 108 (90 Base) MCG/ACT inhaler Inhale 2 puffs into the lungs every 6 (six) hours as needed for wheezing or shortness of breath. 8 g 2   diclofenac Sodium (VOLTAREN) 1 % GEL APPLY 2 G TOPICALLY 2 (TWO) TIMES DAILY AS NEEDED. 100 g 1   furosemide (LASIX) 20 MG tablet Take 0.5 tablets (10 mg total) by mouth daily as needed. 30 tablet 1   levothyroxine (SYNTHROID) 100 MCG tablet Take 1.5  tablets (150 mcg total) by mouth daily. 30 tablet 3   No current facility-administered medications on file prior to visit.    No Known Allergies  Social History   Socioeconomic History   Marital status: Married    Spouse name: Not on file   Number of children: Not on file   Years of education: Not on file   Highest education level: Not on file  Occupational History   Not on file  Tobacco Use   Smoking status: Former   Smokeless tobacco: Never   Tobacco comments:    Quit 12 years ago.   Substance and Sexual Activity   Alcohol use: Yes    Alcohol/week: 0.0 standard drinks  of alcohol    Comment: occ   Drug use: No   Sexual activity: Not on file  Other Topics Concern   Not on file  Social History Narrative   Not on file   Social Determinants of Health   Financial Resource Strain: Not on file  Food Insecurity: Not on file  Transportation Needs: Not on file  Physical Activity: Not on file  Stress: Not on file  Social Connections: Not on file  Intimate Partner Violence: Not on file    Family History  Problem Relation Age of Onset   Colon cancer Neg Hx    Esophageal cancer Neg Hx    Liver cancer Neg Hx    Pancreatic cancer Neg Hx    Rectal cancer Neg Hx    Stomach cancer Neg Hx     Past Surgical History:  Procedure Laterality Date   LYMPHADENECTOMY Left 2016   2 lymph nodes left arm    ROS: Review of Systems Negative except as stated above  PHYSICAL EXAM: BP 120/80 (BP Location: Left Arm, Patient Position: Sitting, Cuff Size: Normal)   Pulse 66   Temp 97.7 F (36.5 C) (Oral)   Ht 6' (1.829 m)   Wt 208 lb (94.3 kg)   SpO2 99%   BMI 28.21 kg/m   Physical Exam   General appearance - alert, well appearing, and in no distress Mental status - normal mood, behavior, speech, dress, motor activity, and thought processes Chest - clear to auscultation, no wheezes, rales or rhonchi, symmetric air entry Heart - normal rate, regular rhythm, normal S1, S2, no murmurs, rubs, clicks or gallops Extremities - peripheral pulses normal, no pedal edema, no clubbing or cyanosis     Latest Ref Rng & Units 11/09/2022    2:46 PM 11/07/2022    1:42 PM 10/31/2022    3:01 PM  CMP  Glucose 70 - 99 mg/dL  87  69   BUN 8 - 27 mg/dL  35  17   Creatinine 0.76 - 1.27 mg/dL  2.09  1.90   Sodium 134 - 144 mmol/L  139  136   Potassium 3.5 - 5.2 mmol/L  4.2  4.2   Chloride 96 - 106 mmol/L  92  94   CO2 20 - 29 mmol/L  28  26   Calcium 8.6 - 10.2 mg/dL 9.7  10.9  10.4   Total Protein 6.0 - 8.5 g/dL 7.2  7.9  8.0   Total Bilirubin 0.0 - 1.2 mg/dL  0.8  0.9    Alkaline Phos 44 - 121 IU/L  86  74   AST 0 - 40 IU/L  74  105   ALT 0 - 44 IU/L  29  37    Lipid Panel  Component Value Date/Time   CHOL 403 (H) 10/31/2022 1501   TRIG 166 (H) 10/31/2022 1501   HDL 57 10/31/2022 1501   CHOLHDL 7.1 (H) 10/31/2022 1501   CHOLHDL 4.3 Ratio 12/15/2008 2222   VLDL 27 12/15/2008 2222   LDLCALC 312 (H) 10/31/2022 1501    CBC    Component Value Date/Time   WBC 5.8 10/31/2022 1501   WBC 7.9 07/30/2020 1400   RBC 5.03 10/31/2022 1501   RBC 5.12 07/30/2020 1400   HGB 15.3 10/31/2022 1501   HCT 47.2 10/31/2022 1501   PLT 153 10/31/2022 1501   MCV 94 10/31/2022 1501   MCH 30.4 10/31/2022 1501   MCH 31.1 07/30/2020 1400   MCHC 32.4 10/31/2022 1501   MCHC 33.4 07/30/2020 1400   RDW 13.4 10/31/2022 1501   LYMPHSABS 1.1 07/30/2020 1400   MONOABS 0.5 07/30/2020 1400   EOSABS 0.1 07/30/2020 1400   BASOSABS 0.0 07/30/2020 1400    ASSESSMENT AND PLAN:  1. Hypothyroidism, unspecified type Continue levothyroxine 150 mcg daily.  Recheck TSH today. - TSH  2. Stage 3b chronic kidney disease (CKD) ([facility]) Continue to stay off NSAIDs.  Await appointment with nephrology  3. Abnormal LFTs Improved.  Continue to limit alcoholic beverages. - Hepatic Function Panel  4. Familial hypercholesterolemia I suspect familial hypercholesterolemia.  We held off on statin therapy due to elevated AST.  We will recheck LFTs today to see if this has normalized.  If it has, we will start him on statin therapy.  Also refer to lipid clinic. - AMB Referral to [facility] - Lipid panel  5. DOE (dyspnea on exertion) Improved with albuterol and furosemide.  He has been referred to cardiology for abnormal echo study.   Addendum: 01/02/2023 -lipid profile has improved.  We will cancel referral to lipid clinic.  Start patient on low-dose of Lipitor. Patient was given the opportunity to ask questions.  Patient verbalized understanding of the plan and was  able to repeat key elements of the plan.   This documentation was completed using Radio producer.  Any transcriptional errors are unintentional.  No orders of the defined types were placed in this encounter.    Requested Prescriptions    No prescriptions requested or ordered in this encounter    No follow-ups on file.  Karle Plumber, MD, FACP

## 2023-01-02 ENCOUNTER — Other Ambulatory Visit: Payer: Self-pay | Admitting: Internal Medicine

## 2023-01-02 DIAGNOSIS — E039 Hypothyroidism, unspecified: Secondary | ICD-10-CM

## 2023-01-02 LAB — LIPID PANEL
Chol/HDL Ratio: 6.5 ratio — ABNORMAL HIGH (ref 0.0–5.0)
Cholesterol, Total: 229 mg/dL — ABNORMAL HIGH (ref 100–199)
HDL: 35 mg/dL — ABNORMAL LOW (ref >39)
LDL Chol Calc (NIH): 115 mg/dL — ABNORMAL HIGH (ref 0–99)
Triglycerides: 453 mg/dL — ABNORMAL HIGH (ref 0–149)
VLDL Cholesterol Cal: 79 mg/dL — ABNORMAL HIGH (ref 5–40)

## 2023-01-02 LAB — HEPATIC FUNCTION PANEL
ALT: 13 IU/L (ref 0–44)
AST: 16 IU/L (ref 0–40)
Albumin: 4.3 g/dL (ref 3.8–4.9)
Alkaline Phosphatase: 96 IU/L (ref 44–121)
Bilirubin Total: 0.3 mg/dL (ref 0.0–1.2)
Bilirubin, Direct: 0.11 mg/dL (ref 0.00–0.40)
Total Protein: 6.8 g/dL (ref 6.0–8.5)

## 2023-01-02 LAB — TSH: TSH: 0.404 u[IU]/mL — ABNORMAL LOW (ref 0.450–4.500)

## 2023-01-02 MED ORDER — LEVOTHYROXINE SODIUM 100 MCG PO TABS: 100.0000 ug | ORAL_TABLET | Freq: Every day | ORAL | 3 refills | Status: DC

## 2023-01-02 MED ORDER — ATORVASTATIN CALCIUM 10 MG PO TABS: 10.0000 mg | ORAL_TABLET | Freq: Every day | ORAL | 4 refills | Status: DC

## 2023-01-02 NOTE — Addendum Note (Signed)
Addended by: Karle Plumber B on: 01/02/2023 08:43 AM   Modules accepted: Orders

## 2023-01-02 NOTE — Progress Notes (Signed)
Let patient and his wife know that his thyroid level has improved but remains slightly out of range.  I recommend decreasing the levothyroxine from 100 mg 1.5 tablets daily to just one tablet daily.  After being on the new dose for 1 month, please return to the lab to have his level rechecked. -Cholesterol level has significantly improved.  Previously it was 312.  No level is 115 with goal being less than 100.  Liver function test has normalized.  We can go ahead and start him on low-dose of cholesterol-lowering medication called atorvastatin.  This will help decrease risk for heart attack and strokes.  Prescription sent to his pharmacy. We will cancel referral to the cholesterol clinic.   The 10-year ASCVD risk score (Arnett DK, et al., 2019) is: 11.3%   Values used to calculate the score:     Age: 62 years     Sex: Male     Is Non-Hispanic African American: No     Diabetic: No     Tobacco smoker: No     Systolic Blood Pressure: 537 mmHg     Is BP treated: No     HDL Cholesterol: 35 mg/dL     Total Cholesterol: 229 mg/dL

## 2023-01-14 ENCOUNTER — Other Ambulatory Visit: Payer: Self-pay | Admitting: Internal Medicine

## 2023-01-14 DIAGNOSIS — G8929 Other chronic pain: Secondary | ICD-10-CM

## 2023-01-15 NOTE — Telephone Encounter (Signed)
Requested medication (s) are due for refill today: Yes  Requested medication (s) are on the active medication list: Yes  Last refill:  11/01/22  Future visit scheduled: Yes  Notes to clinic:  See request.    Requested Prescriptions  Pending Prescriptions Disp Refills   celecoxib (CELEBREX) 200 MG capsule [Pharmacy Med Name: CELECOXIB 200 MG CAPSULE] 30 capsule 1    Sig: TAKE 1 CAPSULE BY MOUTH EVERY DAY     Analgesics:  COX2 Inhibitors Failed - 01/14/2023  3:04 AM      Failed - Manual Review: Labs are only required if the patient has taken medication for more than 8 weeks.      Failed - Cr in normal range and within 360 days    Creatinine, Ser  Date Value Ref Range Status  11/07/2022 2.09 (H) 0.76 - 1.27 mg/dL Final         Passed - HGB in normal range and within 360 days    Hemoglobin  Date Value Ref Range Status  10/31/2022 15.3 13.0 - 17.7 g/dL Final         Passed - HCT in normal range and within 360 days    Hematocrit  Date Value Ref Range Status  10/31/2022 47.2 37.5 - 51.0 % Final         Passed - AST in normal range and within 360 days    AST  Date Value Ref Range Status  01/01/2023 16 0 - 40 IU/L Final         Passed - ALT in normal range and within 360 days    ALT  Date Value Ref Range Status  01/01/2023 13 0 - 44 IU/L Final         Passed - eGFR is 30 or above and within 360 days    GFR calc Af Amer  Date Value Ref Range Status  07/30/2020 >60 >60 mL/min Final   GFR calc non Af Amer  Date Value Ref Range Status  07/30/2020 59 (L) >60 mL/min Final   eGFR  Date Value Ref Range Status  11/07/2022 36 (L) >59 mL/min/1.73 Final         Passed - Patient is not pregnant      Passed - Valid encounter within last 12 months    Recent Outpatient Visits           2 weeks ago Hypothyroidism, unspecified type   [place] [name], MD   2 months ago Establishing care with new doctor, encounter for    [place], MD       Future Appointments             Tomorrow Jerline Pain, MD [place] at [facility], [place]   In 3 months Wynetta Emery, Dalbert Batman, MD [place]

## 2023-01-16 ENCOUNTER — Ambulatory Visit: Payer: Medicaid Other | Attending: Cardiology | Admitting: Cardiology

## 2023-01-16 ENCOUNTER — Encounter: Payer: Self-pay | Admitting: Cardiology

## 2023-01-16 VITALS — BP 122/74 | HR 54 | Ht 72.0 in | Wt 207.0 lb

## 2023-01-16 DIAGNOSIS — R0609 Other forms of dyspnea: Secondary | ICD-10-CM | POA: Diagnosis not present

## 2023-01-16 DIAGNOSIS — R072 Precordial pain: Secondary | ICD-10-CM

## 2023-01-16 DIAGNOSIS — Z01812 Encounter for preprocedural laboratory examination: Secondary | ICD-10-CM | POA: Diagnosis not present

## 2023-01-16 NOTE — Patient Instructions (Addendum)
Medication Instructions:  The current medical regimen is effective;  continue present plan and medications.  *If you need a refill on your cardiac medications before your next appointment, please call your pharmacy*   Lab Work: Please have blood work today (BMP)  If you have labs (blood work) drawn today and your tests are completely normal, you will receive your results only by: MyChart Message (if you have MyChart) OR A paper copy in the mail If you have any lab test that is abnormal or we need to change your treatment, we will call you to review the results.   Testing/Procedures:   Your cardiac CT will be scheduled at:   [facility] [address] [place], [place] [address] [phone]  Please arrive at the [facility] and Children's Entrance (Entrance C2) of [facility] 30 minutes prior to test start time. You can use the FREE valet parking offered at entrance C (encouraged to control the heart rate for the test)  Proceed to the [facility] Radiology Department (first floor) to check-in and test prep.  All radiology patients and guests should use entrance C2 at [facility], accessed from [facility], even though the hospital's physical address listed is [address].    Please follow these instructions carefully (unless otherwise directed):  Hold all erectile dysfunction medications at least 3 days (72 hrs) prior to test. (Ie viagra, cialis, sildenafil, tadalafil, etc) We will administer nitroglycerin during this exam.   On the Night Before the Test: Be sure to Drink plenty of water. Do not consume any caffeinated/decaffeinated beverages or chocolate 12 hours prior to your test. Do not take any antihistamines 12 hours prior to your test.  On the Day of the Test: Drink plenty of water until 1 hour prior to the test. Do not eat any food 1 hour prior to test. You may take your regular medications prior to the test.  Take  metoprolol (Lopressor) two hours prior to test. If you take Furosemide/Hydrochlorothiazide/Spironolactone, please HOLD on the morning of the test.      After the Test: Drink plenty of water. After receiving IV contrast, you may experience a mild flushed feeling. This is normal. On occasion, you may experience a mild rash up to 24 hours after the test. This is not dangerous. If this occurs, you can take Benadryl 25 mg and increase your fluid intake. If you experience trouble breathing, this can be serious. If it is severe call 911 IMMEDIATELY. If it is mild, please call our office.  We will call to schedule your test 2-4 weeks out understanding that some insurance companies will need an authorization prior to the service being performed.   For non-scheduling related questions, please contact the cardiac imaging nurse navigator should you have any questions/concerns: Marchia Bond, Cardiac Imaging Nurse Navigator Gordy Clement, Cardiac Imaging Nurse Navigator [facility] Direct Office Dial: [phone]   For scheduling needs, including cancellations and rescheduling, please call [place], [phone].  Follow-Up: At [facility], you and your health needs are our priority.  As part of our continuing mission to provide you with exceptional heart care, we have created designated Provider Care Teams.  These Care Teams include your primary Cardiologist (physician) and Advanced Practice Providers (APPs -  Physician Assistants and Nurse Practitioners) who all work together to provide you with the care you need, when you need it.  We recommend signing up for the patient portal called "MyChart".  Sign up information is provided on this After Visit Summary.  MyChart is used to connect with patients for Virtual Visits (Telemedicine).  Patients are able to view lab/test results, encounter notes, upcoming appointments, etc.  Non-urgent messages can be sent to your  provider as well.   To learn more about what you can do with MyChart, go to [email or web address].    Your next appointment:   Follow up will be based on the results of the above testing.

## 2023-01-16 NOTE — Progress Notes (Signed)
Cardiology Office Note:    Date:  01/16/2023   ID:  Samuel Luna, DOB 01-Jul-1961, MRN [id number]  PCP:  [name], MD   [place] Providers Cardiologist:  None     Referring MD: [name], MD    History of Present Illness:    Samuel Luna is a 62 y.o. male here for the evaluation of shortness of breath at the request of Tallis Kemna.  He will have shortness of breath when walking, half a mile from his house to work 4 to 5 days a week.  Has to stop to catch his breath.  Has some swelling in his feet and legs.  He quit smoking in the early 2000's.  Smoked for about 20 years.  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  TSH most recently was 0.4.  LDL was recalculated at 115.  Mother died with CHF  Past Medical History:  Diagnosis Date   Arthritis    knees   Brain injury ([place])    Chronic kidney disease    mild   GERD (gastroesophageal reflux disease)    Scoliosis    Thyroid disease    hypothyroidism    Past Surgical History:  Procedure Laterality Date   LYMPHADENECTOMY Left 2016   2 lymph nodes left arm    Current Medications: Current Meds  Medication Sig   albuterol (VENTOLIN HFA) 108 (90 Base) MCG/ACT inhaler Inhale 2 puffs into the lungs every 6 (six) hours as needed for wheezing or shortness of breath.   atorvastatin (LIPITOR) 10 MG tablet Take 1 tablet (10 mg total) by mouth daily.   diclofenac Sodium (VOLTAREN) 1 % GEL APPLY 2 G TOPICALLY 2 (TWO) TIMES DAILY AS NEEDED.   furosemide (LASIX) 20 MG tablet Take 0.5 tablets (10 mg total) by mouth daily as needed.   levothyroxine (SYNTHROID) 100 MCG tablet Take 1 tablet (100 mcg total) by mouth daily. Dose decreased     Allergies:   Patient has no known allergies.   Social History   Socioeconomic History   Marital status: Married    Spouse name: Not on file   Number of children: Not on file    Years of education: Not on file   Highest education level: Not on file  Occupational History   Not on file  Tobacco Use   Smoking status: Former   Smokeless tobacco: Current   Tobacco comments:    Quit 12 years ago.   Substance and Sexual Activity   Alcohol use: Yes    Alcohol/week: 0.0 standard drinks of alcohol    Comment: occ   Drug use: No   Sexual activity: Not on file  Other Topics Concern   Not on file  Social History Narrative   Not on file   Social Determinants of Health   Financial Resource Strain: Not on file  Food Insecurity: Not on file  Transportation Needs: Not on file  Physical Activity: Not on file  Stress: Not on file  Social Connections: Not on file     Family History: The patient's family history includes Congestive Heart Failure in his mother; Heart attack in his maternal grandfather and maternal grandmother. There is no history of Colon cancer, Esophageal cancer, Liver cancer, Pancreatic cancer, Rectal cancer, or Stomach cancer.  ROS:   Please see the history of present illness.    No fevers chills nausea  vomiting all other systems reviewed and are negative.  EKGs/Labs/Other Studies Reviewed:    The following studies were reviewed today: Echocardiogram 12/12/2022:   1. Left ventricular ejection fraction, by estimation, is 50 to 55%. The  left ventricle has low normal function. The left ventricle demonstrates  regional wall motion abnormalities (see scoring diagram/findings for  description). Left ventricular diastolic   parameters are consistent with Grade I diastolic dysfunction (impaired  relaxation).   2. Right ventricular systolic function is normal. The right ventricular  size is normal. There is normal pulmonary artery systolic pressure.   3. The mitral valve is normal in structure. Trivial mitral valve  regurgitation. No evidence of mitral stenosis.   4. The aortic valve is tricuspid. Aortic valve regurgitation is not  visualized. No  aortic stenosis is present.   5. Aortic dilatation noted. There is moderate dilatation of the aortic  root, measuring 43 mm.   6. The inferior vena cava is normal in size with greater than 50%  respiratory variability, suggesting right atrial pressure of 3 mmHg.   EKG:  SB 54 NSSTW changes.  Recent Labs: 10/31/2022: BNP 39.3; Hemoglobin 15.3; Platelets 153 11/07/2022: BUN 35; Creatinine, Ser 2.09; Potassium 4.2; Sodium 139 01/01/2023: ALT 13; TSH 0.404  Recent Lipid Panel    Component Value Date/Time   CHOL 229 (H) 01/01/2023 1628   TRIG 453 (H) 01/01/2023 1628   HDL 35 (L) 01/01/2023 1628   CHOLHDL 6.5 (H) 01/01/2023 1628   CHOLHDL 4.3 Ratio 12/15/2008 2222   VLDL 27 12/15/2008 2222   LDLCALC 115 (H) 01/01/2023 1628     Risk Assessment/Calculations:               Physical Exam:    VS:  BP 122/74   Pulse (!) 54   Ht 6' (1.829 m)   Wt 207 lb (93.9 kg)   SpO2 97%   BMI 28.07 kg/m     Wt Readings from Last 3 Encounters:  01/16/23 207 lb (93.9 kg)  01/01/23 208 lb (94.3 kg)  10/31/22 226 lb (102.5 kg)     GEN:  Well nourished, well developed in no acute distress HEENT: Normal NECK: No JVD; No carotid bruits LYMPHATICS: No lymphadenopathy CARDIAC: RRR, no murmurs, rubs, gallops RESPIRATORY:  Clear to auscultation without rales, wheezing or rhonchi  ABDOMEN: Soft, non-tender, non-distended MUSCULOSKELETAL:  No edema; No deformity  SKIN: Warm and dry NEUROLOGIC:  Alert and oriented x 3 PSYCHIATRIC:  Normal affect   ASSESSMENT:    1. DOE (dyspnea on exertion)   2. Pre-procedure lab exam   3. Precordial chest pain    PLAN:    In order of problems listed above:  Shortness of breath with exertion/precordial chest pain - We will go ahead and check a coronary CT scan.  Want to ensure that he does not have any significant coronary artery disease or luminal narrowing. -Echocardiogram reviewed.  Low normal ejection fraction.  Overall reassuring.  BMP was overall  reassuring as well.  Hypothyroidism - Continue with Synthroid.  TSH has improved.  Creatinine elevation - Previously 2.09 but has been 1.3 as well.  We are going to check a basic metabolic profile today.  Hyperlipidemia - Okay to continue with atorvastatin 10 mg.           Medication Adjustments/Labs and Tests Ordered: Current medicines are reviewed at length with the patient today.  Concerns regarding medicines are outlined above.  Orders Placed This Encounter  Procedures   Basic  metabolic panel   EKG [id number]   No orders of the defined types were placed in this encounter.   Patient Instructions  Medication Instructions:  The current medical regimen is effective;  continue present plan and medications.  *If you need a refill on your cardiac medications before your next appointment, please call your pharmacy*   Lab Work: Please have blood work today (BMP)  If you have labs (blood work) drawn today and your tests are completely normal, you will receive your results only by: MyChart Message (if you have MyChart) OR A paper copy in the mail If you have any lab test that is abnormal or we need to change your treatment, we will call you to review the results.   Testing/Procedures:   Your cardiac CT will be scheduled at:   [facility] [address] [place], [place] [address] [phone]  Please arrive at the [facility] and Children's Entrance (Entrance C2) of [facility] 30 minutes prior to test start time. You can use the FREE valet parking offered at entrance C (encouraged to control the heart rate for the test)  Proceed to the [facility] Radiology Department (first floor) to check-in and test prep.  All radiology patients and guests should use entrance C2 at [facility], accessed from [facility], even though the hospital's physical address listed is [address]    Please follow these instructions carefully  (unless otherwise directed):  Hold all erectile dysfunction medications at least 3 days (72 hrs) prior to test. (Ie viagra, cialis, sildenafil, tadalafil, etc) We will administer nitroglycerin during this exam.   On the Night Before the Test: Be sure to Drink plenty of water. Do not consume any caffeinated/decaffeinated beverages or chocolate 12 hours prior to your test. Do not take any antihistamines 12 hours prior to your test.  On the Day of the Test: Drink plenty of water until 1 hour prior to the test. Do not eat any food 1 hour prior to test. You may take your regular medications prior to the test.  Take metoprolol (Lopressor) two hours prior to test. If you take Furosemide/Hydrochlorothiazide/Spironolactone, please HOLD on the morning of the test.      After the Test: Drink plenty of water. After receiving IV contrast, you may experience a mild flushed feeling. This is normal. On occasion, you may experience a mild rash up to 24 hours after the test. This is not dangerous. If this occurs, you can take Benadryl 25 mg and increase your fluid intake. If you experience trouble breathing, this can be serious. If it is severe call 911 IMMEDIATELY. If it is mild, please call our office.  We will call to schedule your test 2-4 weeks out understanding that some insurance companies will need an authorization prior to the service being performed.   For non-scheduling related questions, please contact the cardiac imaging nurse navigator should you have any questions/concerns: Marchia Bond, Cardiac Imaging Nurse Navigator Gordy Clement, Cardiac Imaging Nurse Navigator [facility] Direct Office Dial: [phone]   For scheduling needs, including cancellations and rescheduling, please call [place], [phone].  Follow-Up: At [facility], you and your health needs are our priority.  As part of our continuing mission to provide you with exceptional  heart care, we have created designated Provider Care Teams.  These Care Teams include your primary Cardiologist (physician) and Advanced Practice Providers (APPs -  Physician Assistants and Nurse Practitioners) who all  work together to provide you with the care you need, when you need it.  We recommend signing up for the patient portal called "MyChart".  Sign up information is provided on this After Visit Summary.  MyChart is used to connect with patients for Virtual Visits (Telemedicine).  Patients are able to view lab/test results, encounter notes, upcoming appointments, etc.  Non-urgent messages can be sent to your provider as well.   To learn more about what you can do with MyChart, go to [email or web address].    Your next appointment:   Follow up will be based on the results of the above testing.     Signed, Candee Furbish, MD  01/16/2023 2:59 PM    [place]

## 2023-01-17 ENCOUNTER — Telehealth: Payer: Self-pay | Admitting: Internal Medicine

## 2023-01-17 DIAGNOSIS — R768 Other specified abnormal immunological findings in serum: Secondary | ICD-10-CM

## 2023-01-17 LAB — BASIC METABOLIC PANEL
BUN/Creatinine Ratio: 13 (ref 10–24)
BUN: 18 mg/dL (ref 8–27)
CO2: 26 mmol/L (ref 20–29)
Calcium: 9.6 mg/dL (ref 8.6–10.2)
Chloride: 102 mmol/L (ref 96–106)
Creatinine, Ser: 1.34 mg/dL — ABNORMAL HIGH (ref 0.76–1.27)
Glucose: 90 mg/dL (ref 70–99)
Potassium: 4.4 mmol/L (ref 3.5–5.2)
Sodium: 142 mmol/L (ref 134–144)
eGFR: 61 mL/min/{1.73_m2} (ref >59)

## 2023-01-17 NOTE — Telephone Encounter (Signed)
Phone call placed to patient and his wife today. Advised that on his workup for hypercalcemia one of his studies came back abnormal which was the free light chain ratio.  I would like to refer him to a hematologist to evaluate this further.  They are agreeable to the referral.

## 2023-01-17 NOTE — Telephone Encounter (Signed)
-----   Message from [name], MD sent at 01/14/2023  3:19 PM EST ----- Regarding: Call about the elevK/L ration

## 2023-01-18 ENCOUNTER — Telehealth: Payer: Self-pay | Admitting: Hematology

## 2023-01-18 NOTE — Telephone Encounter (Signed)
Scheduled appt per 2/14 referral. Pt's wife is aware of appt date and time. Pt's wife is aware to arrive 15 mins prior to appt time and to bring and updated insurance card. Pt's wife is aware of appt location.

## 2023-01-20 ENCOUNTER — Encounter: Payer: Self-pay | Admitting: Hematology

## 2023-01-20 ENCOUNTER — Inpatient Hospital Stay: Payer: Medicaid Other | Attending: Hematology

## 2023-01-20 ENCOUNTER — Inpatient Hospital Stay (HOSPITAL_BASED_OUTPATIENT_CLINIC_OR_DEPARTMENT_OTHER): Payer: Medicaid Other | Admitting: Hematology

## 2023-01-20 VITALS — BP 110/71 | HR 64 | Temp 97.9°F | Resp 20 | Wt 210.4 lb

## 2023-01-20 DIAGNOSIS — M170 Bilateral primary osteoarthritis of knee: Secondary | ICD-10-CM

## 2023-01-20 DIAGNOSIS — E039 Hypothyroidism, unspecified: Secondary | ICD-10-CM | POA: Diagnosis not present

## 2023-01-20 DIAGNOSIS — Z87891 Personal history of nicotine dependence: Secondary | ICD-10-CM | POA: Diagnosis not present

## 2023-01-20 DIAGNOSIS — Z87820 Personal history of traumatic brain injury: Secondary | ICD-10-CM

## 2023-01-20 DIAGNOSIS — R0602 Shortness of breath: Secondary | ICD-10-CM

## 2023-01-20 DIAGNOSIS — N189 Chronic kidney disease, unspecified: Secondary | ICD-10-CM

## 2023-01-20 DIAGNOSIS — Z79899 Other long term (current) drug therapy: Secondary | ICD-10-CM | POA: Insufficient documentation

## 2023-01-20 DIAGNOSIS — R7989 Other specified abnormal findings of blood chemistry: Secondary | ICD-10-CM

## 2023-01-20 DIAGNOSIS — R768 Other specified abnormal immunological findings in serum: Secondary | ICD-10-CM

## 2023-01-20 DIAGNOSIS — M419 Scoliosis, unspecified: Secondary | ICD-10-CM | POA: Diagnosis not present

## 2023-01-20 DIAGNOSIS — Z8719 Personal history of other diseases of the digestive system: Secondary | ICD-10-CM | POA: Insufficient documentation

## 2023-01-20 DIAGNOSIS — K219 Gastro-esophageal reflux disease without esophagitis: Secondary | ICD-10-CM

## 2023-01-20 DIAGNOSIS — Z8249 Family history of ischemic heart disease and other diseases of the circulatory system: Secondary | ICD-10-CM | POA: Diagnosis not present

## 2023-01-20 DIAGNOSIS — M549 Dorsalgia, unspecified: Secondary | ICD-10-CM | POA: Diagnosis not present

## 2023-01-20 LAB — CBC WITH DIFFERENTIAL (CANCER CENTER ONLY)
Abs Immature Granulocytes: 0.01 10*3/uL (ref 0.00–0.07)
Basophils Absolute: 0 10*3/uL (ref 0.0–0.1)
Basophils Relative: 0 %
Eosinophils Absolute: 0.2 10*3/uL (ref 0.0–0.5)
Eosinophils Relative: 3 %
HCT: 38.7 % — ABNORMAL LOW (ref 39.0–52.0)
Hemoglobin: 12.9 g/dL — ABNORMAL LOW (ref 13.0–17.0)
Immature Granulocytes: 0 %
Lymphocytes Relative: 25 %
Lymphs Abs: 1.4 10*3/uL (ref 0.7–4.0)
MCH: 30.3 pg (ref 26.0–34.0)
MCHC: 33.3 g/dL (ref 30.0–36.0)
MCV: 90.8 fL (ref 80.0–100.0)
Monocytes Absolute: 0.6 10*3/uL (ref 0.1–1.0)
Monocytes Relative: 11 %
Neutro Abs: 3.3 10*3/uL (ref 1.7–7.7)
Neutrophils Relative %: 61 %
Platelet Count: 146 10*3/uL — ABNORMAL LOW (ref 150–400)
RBC: 4.26 MIL/uL (ref 4.22–5.81)
RDW: 13.7 % (ref 11.5–15.5)
WBC Count: 5.4 10*3/uL (ref 4.0–10.5)
nRBC: 0 % (ref 0.0–0.2)

## 2023-01-20 LAB — CMP (CANCER CENTER ONLY)
ALT: 14 U/L (ref 0–44)
AST: 16 U/L (ref 15–41)
Albumin: 4.3 g/dL (ref 3.5–5.0)
Alkaline Phosphatase: 71 U/L (ref 38–126)
Anion gap: 7 (ref 5–15)
BUN: 26 mg/dL — ABNORMAL HIGH (ref 6–20)
CO2: 28 mmol/L (ref 22–32)
Calcium: 9.5 mg/dL (ref 8.9–10.3)
Chloride: 104 mmol/L (ref 98–111)
Creatinine: 1.36 mg/dL — ABNORMAL HIGH (ref 0.61–1.24)
GFR, Estimated: 60 mL/min — ABNORMAL LOW
Glucose, Bld: 89 mg/dL (ref 70–99)
Potassium: 4.2 mmol/L (ref 3.5–5.1)
Sodium: 139 mmol/L (ref 135–145)
Total Bilirubin: 0.5 mg/dL (ref 0.3–1.2)
Total Protein: 6.9 g/dL (ref 6.5–8.1)

## 2023-01-20 LAB — LACTATE DEHYDROGENASE: LDH: 113 U/L (ref 98–192)

## 2023-01-20 NOTE — Progress Notes (Signed)
Marland Kitchen   HEMATOLOGY/ONCOLOGY CONSULTATION NOTE  Date of Service: 01/20/2023  Patient Care Team: [name], MD as PCP - General (Internal Medicine) Mardi Mainland, FNP as Nurse Practitioner (Nurse Practitioner)  CHIEF COMPLAINTS/PURPOSE OF CONSULTATION:  Elevated serum light chains  HISTORY OF PRESENTING ILLNESS:  Samuel Luna is a wonderful 62 y.o. male who has been referred to [place] by Dr Karle Plumber for evaluation and management of elevated serum free light chains rule out plasma cell disorder.  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 Patient had workup for hypercalcemia including SPEP which showed no monoclonal protein spike.  He was noted to have mild elevation in IgG kappa free light chains to 35.8 with a slightly elevated kappa lambda ratio at 2.2 in the context of chronic kidney disease. 25-hydroxy vitamin D levels were at 22.7 PTH levels within normal limits at 50 with a repeat calcium along with this at 9.7. PTH RP less than 2 Recent TSH on 01/01/2019 0.4 His creatinine was elevated to 2.09 when he was noted to be hypercalcemic and has since improved 1.34.  He is pending evaluation by nephrology.  Patient was recently seen by Dr. Marlou Porch in cardiology for evaluation of his shortness of breath.  He had an echocardiogram in 12/12/2022 which showed an ejection fraction of 50 to 55%.  Left ventricle noted to have some regional wall motion abnormalities.  Grade 1 diastolic dysfunction. He is being considered for possible coronary CT scan.  Patient is accompanied by his wife who helps him with his history.  She notes that he was not drinking much water  and only drinking sodas previously but has changed this in the last month and a half. He previously also was consuming large amounts of alcohol drinking about 32 ounces of beer daily and has cut this down to 16 ounces of beer twice a week.  Denies any focal bone pains.  No weight loss.  No new skin rashes.  No fevers no chills no night sweats. Has some intermittent minimal back pain when he lifts heavy boxes at work.  MEDICAL HISTORY:  Past Medical History:  Diagnosis Date   Arthritis    knees   Brain injury ([place])    Chronic kidney disease    mild   GERD (gastroesophageal reflux disease)    Scoliosis    Thyroid disease    hypothyroidism    SURGICAL HISTORY: Past Surgical History:  Procedure Laterality Date   LYMPHADENECTOMY Left 2016   2 lymph nodes left arm    SOCIAL HISTORY: Social History   Socioeconomic History   Marital status: Married    Spouse name: Not on file   Number of children: Not on file   Years of education: Not on file   Highest education level: Not on file  Occupational History   Not on file  Tobacco Use   Smoking status: Former   Smokeless tobacco: Current   Tobacco comments:    Quit 12 years ago.   Substance and Sexual Activity   Alcohol use: Yes    Alcohol/week: 0.0 standard drinks of alcohol    Comment: occ   Drug use: No   Sexual activity: Not on file  Other Topics Concern  Not on file  Social History Narrative   Not on file   Social Determinants of Health   Financial Resource Strain: Not on file  Food Insecurity: Not on file  Transportation Needs: Not on file  Physical Activity: Not on file  Stress: Not on file  Social Connections: Not on file  Intimate Partner Violence: Not on file    FAMILY HISTORY: Family History  Problem Relation Age of Onset   Congestive Heart Failure Mother    Heart attack Maternal Grandmother    Heart attack Maternal Grandfather    Colon cancer Neg Hx    Esophageal cancer Neg Hx    Liver cancer  Neg Hx    Pancreatic cancer Neg Hx    Rectal cancer Neg Hx    Stomach cancer Neg Hx     ALLERGIES:  has No Known Allergies.  MEDICATIONS:  Current Outpatient Medications  Medication Sig Dispense Refill   albuterol (VENTOLIN HFA) 108 (90 Base) MCG/ACT inhaler Inhale 2 puffs into the lungs every 6 (six) hours as needed for wheezing or shortness of breath. 8 g 2   atorvastatin (LIPITOR) 10 MG tablet Take 1 tablet (10 mg total) by mouth daily. 30 tablet 4   diclofenac Sodium (VOLTAREN) 1 % GEL APPLY 2 G TOPICALLY 2 (TWO) TIMES DAILY AS NEEDED. 100 g 1   furosemide (LASIX) 20 MG tablet Take 0.5 tablets (10 mg total) by mouth daily as needed. 30 tablet 1   levothyroxine (SYNTHROID) 100 MCG tablet Take 1 tablet (100 mcg total) by mouth daily. Dose decreased 30 tablet 3   No current facility-administered medications for this visit.    REVIEW OF SYSTEMS:    10 Point review of Systems was done is negative except as noted above.  PHYSICAL EXAMINATION: ECOG PERFORMANCE STATUS: 1 - Symptomatic but completely ambulatory  . Vitals:   01/20/23 0810  BP: 110/71  Pulse: 64  Resp: 20  Temp: 97.9 F (36.6 C)  SpO2: 99%   Filed Weights   01/20/23 0810  Weight: 210 lb 6.4 oz (95.4 kg)   .Body mass index is 28.54 kg/m.  GENERAL:alert, in no acute distress and comfortable SKIN: no acute rashes, no significant lesions EYES: conjunctiva are pink and non-injected, sclera anicteric OROPHARYNX: MMM, no exudates, no oropharyngeal erythema or ulceration NECK: supple, no JVD LYMPH:  no palpable lymphadenopathy in the cervical, axillary or inguinal regions LUNGS: clear to auscultation b/l with normal respiratory effort HEART: regular rate & rhythm ABDOMEN:  normoactive bowel sounds , non tender, not distended. Extremity: Trace pedal edema PSYCH: alert & oriented x 3 with fluent speech NEURO: no focal motor/sensory deficits  LABORATORY DATA:  I have reviewed the data as listed  .     Latest Ref Rng & Units 10/31/2022    3:01 PM 07/30/2020    2:00 PM 03/16/2015    5:11 PM  CBC  WBC 3.4 - 10.8 x10E3/uL 5.8  7.9    Hemoglobin 13.0 - 17.7 g/dL 15.3  15.9  17.0   Hematocrit 37.5 - 51.0 % 47.2  47.6  50.0   Platelets 150 - 450 x10E3/uL 153  195      .    Latest Ref Rng & Units 01/16/2023    2:47 PM 01/01/2023    4:28 PM 11/09/2022    2:46 PM  CMP  Glucose 70 - 99 mg/dL 90     BUN 8 - 27 mg/dL 18     Creatinine 0.76 - 1.27 mg/dL  1.34     Sodium 134 - 144 mmol/L 142     Potassium 3.5 - 5.2 mmol/L 4.4     Chloride 96 - 106 mmol/L 102     CO2 20 - 29 mmol/L 26     Calcium 8.6 - 10.2 mg/dL 9.6   9.7   Total Protein 6.0 - 8.5 g/dL  6.8  7.2   Total Bilirubin 0.0 - 1.2 mg/dL  0.3    Alkaline Phos 44 - 121 IU/L  96    AST 0 - 40 IU/L  16    ALT 0 - 44 IU/L  13     Component     Latest Ref Rng 11/09/2022 01/01/2023  Total Protein     6.0 - 8.5 g/dL 7.2    Albumin ELP     2.9 - 4.4 g/dL 4.5 (H)    Alpha 1     0.0 - 0.4 g/dL 0.2    Alpha 2     0.4 - 1.0 g/dL 0.6    Beta     0.7 - 1.3 g/dL 0.9    Gamma Globulin     0.4 - 1.8 g/dL 1.1    M-SPIKE, %     Not Observed g/dL Not Observed    Globulin, Total     2.2 - 3.9 g/dL 2.7    A/G Ratio     0.7 - 1.7  1.7    Please Note: Comment    Ig Kappa Free Light Chain     3.3 - 19.4 mg/L 35.8 (H)    Ig Lambda Free Light Chain     5.7 - 26.3 mg/L 16.3    KAPPA/LAMBDA RATIO     0.26 - 1.65  2.20 (H)    Calcium     8.6 - 10.2 mg/dL 9.7    PTH, Intact     15 - 65 pg/mL 50    PTH Interp Comment    PTH-related peptide     pmol/L <2.0    Vitamin D, 25-Hydroxy     30.0 - 100.0 ng/mL 22.7 (L)    TSH     0.450 - 4.500 uIU/mL  0.404 (L)     Legend: (H) High (L) Low  RADIOGRAPHIC STUDIES: I have personally reviewed the radiological images as listed and agreed with the findings in the report. No results found.  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  #2 hypercalcemia likely related to dehydration PTH, PTH RP within normal limits Unlikely to be related to a plasma cell dyscrasia Hypercalcemia resolved with better hydration.  Patient to switch to drinking water instead of only sodas and has cut down his significant beer intake.  #3 hypothyroidism.  TSH slightly suppressed at 0.4  #4 shortness of breath.  Has had mild obstructive lung disease.  Ex-smoker.  Recent cardiology evaluation shows regional wall motion normalities and grade 1 diastolic dysfunction.  Follows with Dr. Marlou Porch.  Plan -Patient's EMR reviewed in detail -He does not seem to have any overt new symptomatology suggestive of plasma cell dyscrasia. -His hypercalcemia was transient and seems to have resolved with improvement in his renal function and hydration status. -No new anemia or progressively worsening renal dysfunction.  No new focal bone  pains. -We discussed that the likelihood of his light chains being elevated due to dehydration and poor renal clearance is high and the likelihood of him having a plasma cell dyscrasia is low. -We discussed having some baseline labs to further define any risk for plasma cell dyscrasia. -Continue follow-up with cardiology for management of his cardiac issues and changes. -He has a follow-up with nephrology to monitor and manage his chronic kidney disease  . Orders Placed This Encounter  Procedures   CBC with Differential ([place] Only)    Standing Status:   Future    Standing Expiration Date:   01/21/2024   CMP ([place] only)    Standing Status:   Future    Standing Expiration Date:   01/21/2024   Kappa/lambda light chains    Standing Status:   Future    Standing Expiration Date:   01/21/2024   Multiple Myeloma Panel  (SPEP&IFE w/QIG)    Standing Status:   Future    Standing Expiration Date:   01/21/2024   Lactate dehydrogenase    Standing Status:   Future    Standing Expiration Date:   01/21/2024    Follow-up Labs today Phone visit with Dr. Irene Limbo in 2 weeks  The total time spent in the appointment was 45 minutes*.  All of the patient's questions were answered with apparent satisfaction. The patient knows to call the clinic with any problems, questions or concerns.   Sullivan Lone MD MS AAHIVMS [facility] Hematology/Oncology Physician [facility]  .*Total Encounter Time as defined by the Centers for Medicare and Medicaid Services includes, in addition to the face-to-face time of a patient visit (documented in the note above) non-face-to-face time: obtaining and reviewing outside history, ordering and reviewing medications, tests or procedures, care coordination (communications with other health care professionals or caregivers) and documentation in the medical record.

## 2023-01-22 ENCOUNTER — Telehealth: Payer: Self-pay | Admitting: Hematology

## 2023-01-22 LAB — KAPPA/LAMBDA LIGHT CHAINS
Kappa free light chain: 25.1 mg/L — ABNORMAL HIGH (ref 3.3–19.4)
Kappa, lambda light chain ratio: 1.78 — ABNORMAL HIGH (ref 0.26–1.65)
Lambda free light chains: 14.1 mg/L (ref 5.7–26.3)

## 2023-01-22 NOTE — Telephone Encounter (Signed)
Called patient per 2/17 los notes to schedule f/u. Patient scheduled and notified.

## 2023-01-26 ENCOUNTER — Ambulatory Visit (AMBULATORY_SURGERY_CENTER): Payer: Medicaid Other | Admitting: *Deleted

## 2023-01-26 VITALS — Ht 72.0 in | Wt 220.0 lb

## 2023-01-26 DIAGNOSIS — Z86010 Personal history of colonic polyps: Secondary | ICD-10-CM

## 2023-01-26 LAB — MULTIPLE MYELOMA PANEL, SERUM
Albumin SerPl Elph-Mcnc: 3.8 g/dL (ref 2.9–4.4)
Albumin/Glob SerPl: 1.5 (ref 0.7–1.7)
Alpha 1: 0.2 g/dL (ref 0.0–0.4)
Alpha2 Glob SerPl Elph-Mcnc: 0.6 g/dL (ref 0.4–1.0)
B-Globulin SerPl Elph-Mcnc: 0.9 g/dL (ref 0.7–1.3)
Gamma Glob SerPl Elph-Mcnc: 0.9 g/dL (ref 0.4–1.8)
Globulin, Total: 2.6 g/dL (ref 2.2–3.9)
IgA: 96 mg/dL (ref 90–386)
IgG (Immunoglobin G), Serum: 977 mg/dL (ref 603–1613)
IgM (Immunoglobulin M), Srm: 58 mg/dL (ref 20–172)
Total Protein ELP: 6.4 g/dL (ref 6.0–8.5)

## 2023-01-26 MED ORDER — PEG 3350-KCL-NA BICARB-NACL 420 G PO SOLR: 4000.0000 mL | Freq: Once | ORAL | 0 refills | Status: AC

## 2023-01-26 NOTE — Progress Notes (Signed)
No egg or soy allergy known to patient  No issues known to pt with past sedation with any surgeries or procedures Patient denies ever being told they had issues or difficulty with intubation  No FH of Malignant Hyperthermia Pt is not on diet pills Pt is not on  home 02  Pt is not on blood thinners  Pt denies issues with constipation  No A fib or A flutter Have any cardiac testing pending-not scheduled yet. Pt instructed to use Singlecare.com or GoodRx for a price reduction on prep    Patient's chart reviewed by Osvaldo Angst CNRA prior to previsit and patient appropriate for the [place].  Previsit completed and red dot placed by patient's name on their procedure day (on provider's schedule).

## 2023-01-30 ENCOUNTER — Other Ambulatory Visit: Payer: Self-pay | Admitting: Internal Medicine

## 2023-01-30 DIAGNOSIS — R600 Localized edema: Secondary | ICD-10-CM

## 2023-01-30 NOTE — Telephone Encounter (Signed)
Requested Prescriptions  Pending Prescriptions Disp Refills   furosemide (LASIX) 20 MG tablet [Pharmacy Med Name: FUROSEMIDE 20 MG TABLET] 30 tablet 1    Sig: TAKE 0.5 TABLET (10 MG TOTAL) BY MOUTH DAILY AS NEEDED     Cardiovascular:  Diuretics - Loop Failed - 01/30/2023 10:40 AM      Failed - Cr in normal range and within 180 days    Creatinine  Date Value Ref Range Status  01/20/2023 1.36 (H) 0.61 - 1.24 mg/dL Final         Failed - Mg Level in normal range and within 180 days    No results found for: "MG"       Passed - K in normal range and within 180 days    Potassium  Date Value Ref Range Status  01/20/2023 4.2 3.5 - 5.1 mmol/L Final         Passed - Ca in normal range and within 180 days    Calcium  Date Value Ref Range Status  01/20/2023 9.5 8.9 - 10.3 mg/dL Final   Calcium, Ion  Date Value Ref Range Status  03/16/2015 1.03 (L) 1.12 - 1.23 mmol/L Final         Passed - Na in normal range and within 180 days    Sodium  Date Value Ref Range Status  01/20/2023 139 135 - 145 mmol/L Final  01/16/2023 142 134 - 144 mmol/L Final         Passed - Cl in normal range and within 180 days    Chloride  Date Value Ref Range Status  01/20/2023 104 98 - 111 mmol/L Final         Passed - Last BP in normal range    BP Readings from Last 1 Encounters:  01/20/23 110/71         Passed - Valid encounter within last 6 months    Recent Outpatient Visits           4 weeks ago Hypothyroidism, unspecified type   [place], MD   3 months ago Establishing care with new doctor, encounter for   [place], MD       Future Appointments             In 3 months [name], MD [place]

## 2023-01-30 NOTE — Addendum Note (Signed)
Addended by: Shellia Cleverly on: 01/30/2023 09:45 AM   Modules accepted: Orders

## 2023-02-01 ENCOUNTER — Ambulatory Visit: Payer: Medicaid Other | Attending: Internal Medicine

## 2023-02-01 DIAGNOSIS — E039 Hypothyroidism, unspecified: Secondary | ICD-10-CM

## 2023-02-02 ENCOUNTER — Other Ambulatory Visit: Payer: Self-pay | Admitting: Internal Medicine

## 2023-02-02 DIAGNOSIS — G8929 Other chronic pain: Secondary | ICD-10-CM

## 2023-02-02 LAB — TSH: TSH: 3.5 u[IU]/mL (ref 0.450–4.500)

## 2023-02-06 ENCOUNTER — Inpatient Hospital Stay: Payer: Medicaid Other | Attending: Hematology | Admitting: Hematology

## 2023-02-06 ENCOUNTER — Telehealth (HOSPITAL_COMMUNITY): Payer: Self-pay | Admitting: *Deleted

## 2023-02-06 DIAGNOSIS — K219 Gastro-esophageal reflux disease without esophagitis: Secondary | ICD-10-CM | POA: Insufficient documentation

## 2023-02-06 DIAGNOSIS — M549 Dorsalgia, unspecified: Secondary | ICD-10-CM | POA: Insufficient documentation

## 2023-02-06 DIAGNOSIS — R7989 Other specified abnormal findings of blood chemistry: Secondary | ICD-10-CM | POA: Diagnosis present

## 2023-02-06 DIAGNOSIS — N189 Chronic kidney disease, unspecified: Secondary | ICD-10-CM | POA: Insufficient documentation

## 2023-02-06 DIAGNOSIS — M419 Scoliosis, unspecified: Secondary | ICD-10-CM | POA: Diagnosis not present

## 2023-02-06 DIAGNOSIS — Z79899 Other long term (current) drug therapy: Secondary | ICD-10-CM | POA: Insufficient documentation

## 2023-02-06 DIAGNOSIS — E039 Hypothyroidism, unspecified: Secondary | ICD-10-CM | POA: Insufficient documentation

## 2023-02-06 DIAGNOSIS — E860 Dehydration: Secondary | ICD-10-CM | POA: Diagnosis not present

## 2023-02-06 DIAGNOSIS — Z8249 Family history of ischemic heart disease and other diseases of the circulatory system: Secondary | ICD-10-CM | POA: Insufficient documentation

## 2023-02-06 DIAGNOSIS — Z87891 Personal history of nicotine dependence: Secondary | ICD-10-CM | POA: Insufficient documentation

## 2023-02-06 DIAGNOSIS — Z87820 Personal history of traumatic brain injury: Secondary | ICD-10-CM | POA: Insufficient documentation

## 2023-02-06 DIAGNOSIS — R0602 Shortness of breath: Secondary | ICD-10-CM | POA: Insufficient documentation

## 2023-02-06 DIAGNOSIS — M170 Bilateral primary osteoarthritis of knee: Secondary | ICD-10-CM | POA: Diagnosis not present

## 2023-02-06 DIAGNOSIS — Z8719 Personal history of other diseases of the digestive system: Secondary | ICD-10-CM | POA: Diagnosis not present

## 2023-02-06 DIAGNOSIS — R768 Other specified abnormal immunological findings in serum: Secondary | ICD-10-CM | POA: Diagnosis not present

## 2023-02-06 NOTE — Progress Notes (Signed)
Samuel Luna   HEMATOLOGY/ONCOLOGY PHONE VISIT NOTE  Date of Service: 02/06/2023  Patient Care Team: [name], MD as PCP - General (Internal Medicine) Mardi Mainland, FNP as Nurse Practitioner (Nurse Practitioner) Brunetta Genera, MD as Consulting Physician (Hematology)  CHIEF COMPLAINTS/PURPOSE OF CONSULTATION:  Elevated serum light chains  HISTORY OF PRESENTING ILLNESS:  Samuel Luna is a wonderful 62 y.o. male who has been referred to [place] by Dr Karle Plumber for evaluation and management of elevated serum free light chains rule out plasma cell disorder.  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 Patient had workup for hypercalcemia including SPEP which showed no monoclonal protein spike.  He was noted to have mild elevation in IgG kappa free light chains to 35.8 with a slightly elevated kappa lambda ratio at 2.2 in the context of chronic kidney disease. 25-hydroxy vitamin D levels were at 22.7 PTH levels within normal limits at 50 with a repeat calcium along with this at 9.7. PTH RP less than 2 Recent TSH on 01/01/2019 0.4 His creatinine was elevated to 2.09 when he was noted to be hypercalcemic and has since improved 1.34.  He is pending evaluation by nephrology.  Patient was recently seen by Dr. Marlou Porch in cardiology for evaluation of his shortness of breath.  He had an echocardiogram in 12/12/2022 which showed an ejection fraction of 50 to 55%.  Left ventricle noted to have some regional wall motion abnormalities.  Grade 1 diastolic dysfunction. He is being considered for possible coronary CT scan.  Patient is accompanied by his wife who helps him with  his history.  She notes that he was not drinking much water and only drinking sodas previously but has changed this in the last month and a half. He previously also was consuming large amounts of alcohol drinking about 32 ounces of beer daily and has cut this down to 16 ounces of beer twice a week.  Denies any focal bone pains.  No weight loss.  No new skin rashes.  No fevers no chills no night sweats. Has some intermittent minimal back pain when he lifts heavy boxes at work.  INTERVAL HISTORY: Samuel Luna is a wonderful 62 y.o. male who is contacted via phone for continued evaluation and management of elevated serum free light chains rule out plasma cell disorder. Patient was initially seen by me on 01/20/2023.  .I connected with Samuel Luna on 02/06/2023 at  8:40 AM EST by telephone visit and verified that I am speaking with the correct person using two identifiers.   Patient reports he has been doing well overall without any new medical concerns.   Labs were discussed in detail with the patient.   I discussed the limitations, risks, security and privacy concerns of performing an evaluation and management service by telemedicine and the availability of in-person appointments. I also discussed with the patient that there may be a patient responsible charge related to this service. The patient expressed understanding and agreed to proceed.   Other persons participating in the visit and their role in the encounter: None   Patient's location: Home  Provider's location[facility]   Chief Complaint: Elevated serum light chains       MEDICAL HISTORY:  Past Medical History:  Diagnosis Date   Arthritis    knees   Brain injury ([place])    Chronic kidney disease    mild   GERD (gastroesophageal reflux disease)    Scoliosis    Thyroid disease    hypothyroidism    SURGICAL HISTORY: Past Surgical History:  Procedure Laterality Date   COLONOSCOPY     LYMPHADENECTOMY Left 12/04/2014   2  lymph nodes left arm   POLYPECTOMY      SOCIAL HISTORY: Social History   Socioeconomic History   Marital status: Married    Spouse name: Not on file   Number of children: Not on file   Years of education: Not on file   Highest education level: Not on file  Occupational History   Occupation: [employer]  Tobacco Use   Smoking status: Former   Smokeless tobacco: Former   Tobacco comments:    Quit 12 years ago.   Vaping Use   Vaping Use: Never used  Substance and Sexual Activity   Alcohol use: Yes    Comment: occ   Drug use: No   Sexual activity: Not on file  Other Topics Concern   Not on file  Social History Narrative   Not on file   Social Determinants of Health   Financial Resource Strain: Not on file  Food Insecurity: Not on file  Transportation Needs: Not on file  Physical Activity: Sufficiently Active (01/20/2023)   Exercise Vital Sign    Days of Exercise per Week: 7 days    Minutes of Exercise per Session: 30 min  Stress: Not on file  Social Connections: Not on file  Intimate Partner Violence: Not on file    FAMILY HISTORY: Family History  Problem Relation Age of Onset   Congestive Heart Failure Mother    Heart attack Maternal Grandmother    Heart attack Maternal Grandfather    Colon cancer Neg Hx    Esophageal cancer Neg Hx    Liver cancer Neg Hx    Pancreatic cancer Neg Hx    Rectal cancer Neg Hx    Stomach cancer Neg Hx    Colon polyps Neg Hx    Crohn's disease Neg Hx    Ulcerative colitis Neg Hx     ALLERGIES:  has No Known Allergies.  MEDICATIONS:  Current Outpatient Medications  Medication Sig Dispense Refill   albuterol (VENTOLIN HFA) 108 (90 Base) MCG/ACT inhaler Inhale 2 puffs into the lungs every 6 (six) hours as needed for wheezing or shortness of breath. 8 g 2   atorvastatin (LIPITOR) 10 MG tablet Take 1 tablet (10 mg total) by mouth daily. 30 tablet 4   diclofenac Sodium (VOLTAREN) 1 % GEL APPLY 2 G TOPICALLY 2 (TWO) TIMES DAILY AS  NEEDED. 100 g 1   furosemide (LASIX) 20 MG tablet Take 0.5 tablets (10 mg total) by mouth daily as needed. 30 tablet 1   levothyroxine (SYNTHROID) 100 MCG tablet Take 1 tablet (100 mcg total) by mouth daily. Dose decreased 30 tablet 3   No current facility-administered medications for this visit.    PHYSICAL EXAMINATION: TELE-MED VISIT  LABORATORY DATA:  I have reviewed the data as listed  .    Latest Ref Rng & Units 01/20/2023    8:50 AM 10/31/2022    3:01 PM 07/30/2020    2:00 PM  CBC  WBC 4.0 - 10.5 K/uL 5.4  5.8  7.9   Hemoglobin 13.0 - 17.0 g/dL 12.9  15.3  15.9   Hematocrit 39.0 - 52.0 % 38.7  47.2  47.6   Platelets 150 - 400 K/uL 146  153  195     .    Latest Ref Rng & Units 01/20/2023    8:50 AM 01/16/2023    2:47 PM 01/01/2023    4:28 PM  CMP  Glucose 70 - 99 mg/dL 89  90    BUN 6 - 20 mg/dL 26  18    Creatinine 0.61 - 1.24 mg/dL 1.36  1.34    Sodium 135 - 145 mmol/L 139  142    Potassium 3.5 - 5.1 mmol/L 4.2  4.4    Chloride 98 - 111 mmol/L 104  102    CO2 22 - 32 mmol/L 28  26    Calcium 8.9 - 10.3 mg/dL 9.5  9.6    Total Protein 6.5 - 8.1 g/dL 6.9   6.8   Total Bilirubin 0.3 - 1.2 mg/dL 0.5   0.3   Alkaline Phos 38 - 126 U/L 71   96   AST 15 - 41 U/L 16   16   ALT 0 - 44 U/L 14   13    Component     Latest Ref Rng 11/09/2022 01/01/2023  Total Protein     6.0 - 8.5 g/dL 7.2    Albumin ELP     2.9 - 4.4 g/dL 4.5 (H)    Alpha 1     0.0 - 0.4 g/dL 0.2    Alpha 2     0.4 - 1.0 g/dL 0.6    Beta     0.7 - 1.3 g/dL 0.9    Gamma Globulin     0.4 - 1.8 g/dL 1.1    M-SPIKE, %     Not Observed g/dL Not Observed    Globulin, Total     2.2 - 3.9 g/dL 2.7    A/G Ratio     0.7 - 1.7  1.7    Please Note: Comment    Ig Kappa Free Light Chain     3.3 - 19.4 mg/L 35.8 (H)    Ig Lambda Free Light Chain     5.7 - 26.3 mg/L 16.3    KAPPA/LAMBDA RATIO     0.26 - 1.65  2.20 (H)    Calcium     8.6 - 10.2 mg/dL 9.7    PTH, Intact     15 - 65 pg/mL 50    PTH  Interp Comment    PTH-related peptide     pmol/L <2.0    Vitamin D, 25-Hydroxy     30.0 - 100.0 ng/mL 22.7 (L)    TSH     0.450 - 4.500 uIU/mL  0.404 (L)     Legend: (H) High (L) Low  RADIOGRAPHIC STUDIES: I have personally reviewed the radiological images as listed and agreed with the findings in the report. No results found.  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  #2 hypercalcemia likely related to dehydration PTH, PTH RP within normal limits Unlikely to be related to a plasma cell dyscrasia Hypercalcemia resolved with better hydration.  Patient to switch to drinking water instead of only sodas and has cut down his significant  beer intake.  #3 hypothyroidism.  TSH slightly suppressed at 0.4  #4 shortness of breath.  Has had mild obstructive lung disease.  Ex-smoker.  Recent cardiology evaluation shows regional wall motion normalities and grade 1 diastolic dysfunction.  Follows with Dr. Marlou Porch.  PLAN: -Discussed lab results from 01/20/2023 with the patient. CBC shows slightly decreased hematocrit of 38.7 and platelets of 146. CMP shows slightly elevated creatinine of 1.36. LDH is in the normal range at 113. -Discussed Multiple Myeloma Panel results from 01/30/2023 did not show any abnormalities. Kappa/Lambda light chain ratio were elevated at 1.78 probably because of chronic kidney disease.  -No major sign of Multiple myeloma or plasma cell disorder.  -Continue to follow-up with PCP. -Recommended to drink at least 2 L of water per day.  FOLLOW-UP: RTC with Dr Irene Limbo as needed  The total time spent in the appointment was 15 minutes* .  All of the patient's questions were answered with apparent satisfaction. The  patient knows to call the clinic with any problems, questions or concerns.   Sullivan Lone MD MS AAHIVMS [facility] Hematology/Oncology Physician [facility]  .*Total Encounter Time as defined by the Centers for Medicare and Medicaid Services includes, in addition to the face-to-face time of a patient visit (documented in the note above) non-face-to-face time: obtaining and reviewing outside history, ordering and reviewing medications, tests or procedures, care coordination (communications with other health care professionals or caregivers) and documentation in the medical record.   I, Cleda Mccreedy, am acting as a Education administrator for Sullivan Lone, MD.  .I have reviewed the above documentation for accuracy and completeness, and I agree with the above. Brunetta Genera MD

## 2023-02-06 NOTE — Telephone Encounter (Signed)
Attempted to call patient regarding upcoming cardiac CT appointment. Patient's wife answered phone and left message with call back number for patient to call back.  Gordy Clement RN Navigator Cardiac Imaging [facility] [phone] Office [phone] Cell

## 2023-02-07 ENCOUNTER — Ambulatory Visit (HOSPITAL_COMMUNITY)
Admission: RE | Admit: 2023-02-07 | Discharge: 2023-02-07 | Disposition: A | Discharge status: Home / Self Care | Payer: Medicaid Other | Source: Ambulatory Visit | Attending: Cardiology | Admitting: Cardiology

## 2023-02-07 DIAGNOSIS — R0609 Other forms of dyspnea: Secondary | ICD-10-CM | POA: Insufficient documentation

## 2023-02-07 DIAGNOSIS — R072 Precordial pain: Secondary | ICD-10-CM

## 2023-02-07 MED ORDER — NITROGLYCERIN 0.4 MG SL SUBL
SUBLINGUAL_TABLET | SUBLINGUAL | Status: AC
  Filled 2023-02-07: qty 2

## 2023-02-07 MED ORDER — NITROGLYCERIN 0.4 MG SL SUBL
0.8000 mg | SUBLINGUAL_TABLET | Freq: Once | SUBLINGUAL | Status: AC
  Administered 2023-02-07: 0.8 mg via SUBLINGUAL

## 2023-02-07 MED ORDER — IOHEXOL 350 MG/ML SOLN
100.0000 mL | Freq: Once | INTRAVENOUS | Status: AC | PRN
  Administered 2023-02-07: 100 mL via INTRAVENOUS

## 2023-02-12 ENCOUNTER — Ambulatory Visit (AMBULATORY_SURGERY_CENTER): Payer: Medicaid Other | Admitting: Gastroenterology

## 2023-02-12 ENCOUNTER — Telehealth: Payer: Self-pay | Admitting: *Deleted

## 2023-02-12 ENCOUNTER — Encounter: Payer: Self-pay | Admitting: Gastroenterology

## 2023-02-12 VITALS — BP 141/62 | HR 51 | Temp 97.1°F | Resp 9 | Ht 72.0 in | Wt 220.0 lb

## 2023-02-12 DIAGNOSIS — Z09 Encounter for follow-up examination after completed treatment for conditions other than malignant neoplasm: Secondary | ICD-10-CM

## 2023-02-12 DIAGNOSIS — D128 Benign neoplasm of rectum: Secondary | ICD-10-CM

## 2023-02-12 DIAGNOSIS — K621 Rectal polyp: Secondary | ICD-10-CM | POA: Diagnosis not present

## 2023-02-12 DIAGNOSIS — Z86010 Personal history of colonic polyps: Secondary | ICD-10-CM

## 2023-02-12 DIAGNOSIS — Z79899 Other long term (current) drug therapy: Secondary | ICD-10-CM

## 2023-02-12 DIAGNOSIS — E7800 Pure hypercholesterolemia, unspecified: Secondary | ICD-10-CM

## 2023-02-12 MED ORDER — ROSUVASTATIN CALCIUM 10 MG PO TABS: 10.0000 mg | ORAL_TABLET | Freq: Every day | ORAL | 3 refills | Status: DC

## 2023-02-12 MED ORDER — SODIUM CHLORIDE 0.9 % IV SOLN: 500.0000 mL | INTRAVENOUS | Status: DC

## 2023-02-12 NOTE — Progress Notes (Signed)
[facility] History and Physical   Primary Care Physician:  [name], MD   Reason for Procedure:   Colon cancer screening/polyp surveillance  Plan:    Colonoscopy     HPI: Jahsean Cail is a 62 y.o. male undergoing screening colonoscopy/polyp surveillance.  He has no family history of colon cancer and no chronic GI symptoms. He underwent a colonoscopy in Feb 2017 and had 2 tubular adenomas, 6-9 mm in size removed with snare   Past Medical History:  Diagnosis Date   Arthritis    knees   Brain injury ([place])    Chronic kidney disease    mild   GERD (gastroesophageal reflux disease)    Scoliosis    Thyroid disease    hypothyroidism    Past Surgical History:  Procedure Laterality Date   COLONOSCOPY     LYMPHADENECTOMY Left 12/04/2014   2 lymph nodes left arm   POLYPECTOMY      Prior to Admission medications   Medication Sig Start Date End Date Taking? Authorizing Provider  furosemide (LASIX) 20 MG tablet Take 0.5 tablets (10 mg total) by mouth daily as needed. 10/31/22  Yes [name], MD  levothyroxine (SYNTHROID) 100 MCG tablet Take 1 tablet (100 mcg total) by mouth daily. Dose decreased 01/02/23  Yes [name], MD  rosuvastatin (CRESTOR) 10 MG tablet Take 1 tablet (10 mg total) by mouth daily. 02/12/23  Yes Jerline Pain, MD  albuterol (VENTOLIN HFA) 108 (90 Base) MCG/ACT inhaler Inhale 2 puffs into the lungs every 6 (six) hours as needed for wheezing or shortness of breath. 12/17/22   [name], MD  diclofenac Sodium (VOLTAREN) 1 % GEL APPLY 2 G TOPICALLY 2 (TWO) TIMES DAILY AS NEEDED. 12/22/22   [name], MD    Current Outpatient Medications  Medication Sig Dispense Refill   furosemide (LASIX) 20 MG tablet Take 0.5 tablets (10 mg total) by mouth daily as needed. 30 tablet 1   levothyroxine (SYNTHROID) 100 MCG tablet Take 1 tablet (100 mcg total) by mouth daily. Dose decreased 30 tablet 3   rosuvastatin (CRESTOR)  10 MG tablet Take 1 tablet (10 mg total) by mouth daily. 90 tablet 3   albuterol (VENTOLIN HFA) 108 (90 Base) MCG/ACT inhaler Inhale 2 puffs into the lungs every 6 (six) hours as needed for wheezing or shortness of breath. 8 g 2   diclofenac Sodium (VOLTAREN) 1 % GEL APPLY 2 G TOPICALLY 2 (TWO) TIMES DAILY AS NEEDED. 100 g 1   Current Facility-Administered Medications  Medication Dose Route Frequency Provider Last Rate Last Admin   0.9 %  sodium chloride infusion  500 mL Intravenous Continuous Daryel November, MD        Allergies as of 02/12/2023   (No Known Allergies)    Family History  Problem Relation Age of Onset   Congestive Heart Failure Mother    Heart attack Maternal Grandmother    Heart attack Maternal Grandfather    Colon cancer Neg Hx    Esophageal cancer Neg Hx    Liver cancer Neg Hx    Pancreatic cancer Neg Hx    Rectal cancer Neg Hx    Stomach cancer Neg Hx    Colon polyps Neg Hx    Crohn's disease Neg Hx    Ulcerative colitis Neg Hx     Social History   Socioeconomic History   Marital status: Married    Spouse name: Not on file  Number of children: Not on file   Years of education: Not on file   Highest education level: Not on file  Occupational History   Occupation: [employer]  Tobacco Use   Smoking status: Former   Smokeless tobacco: Former   Tobacco comments:    Quit 12 years ago.   Vaping Use   Vaping Use: Never used  Substance and Sexual Activity   Alcohol use: Yes    Comment: occ   Drug use: No   Sexual activity: Not on file  Other Topics Concern   Not on file  Social History Narrative   Not on file   Social Determinants of Health   Financial Resource Strain: Not on file  Food Insecurity: Not on file  Transportation Needs: Not on file  Physical Activity: Sufficiently Active (01/20/2023)   Exercise Vital Sign    Days of Exercise per Week: 7 days    Minutes of Exercise per Session: 30 min  Stress: Not on file  Social Connections:  Not on file  Intimate Partner Violence: Not on file    Review of Systems:  All other review of systems negative except as mentioned in the HPI.  Physical Exam: Vital signs BP 139/82   Pulse 64   Temp (!) 97.1 F (36.2 C)   Ht 6' (1.829 m)   Wt 220 lb (99.8 kg)   SpO2 98%   BMI 29.84 kg/m   General:   Alert,  Well-developed, well-nourished, pleasant and cooperative in NAD Airway:  Mallampati 2 Lungs:  Clear throughout to auscultation.   Heart:  Regular rate and rhythm; no murmurs, clicks, rubs,  or gallops. Abdomen:  Soft, nontender and nondistended. Normal bowel sounds.   Neuro/Psych:  Normal mood and affect. A and O x 3   Eisen Robenson E. Candis Schatz, MD [facility]

## 2023-02-12 NOTE — Progress Notes (Signed)
Pt's states no medical or surgical changes since previsit or office visit. 

## 2023-02-12 NOTE — Patient Instructions (Signed)
Handouts Provided:  Polyps  YOU HAD AN ENDOSCOPIC PROCEDURE TODAY AT THE [facility]:   Refer to the procedure report that was given to you for any specific questions about what was found during the examination.  If the procedure report does not answer your questions, please call your gastroenterologist to clarify.  If you requested that your care partner not be given the details of your procedure findings, then the procedure report has been included in a sealed envelope for you to review at your convenience later.  YOU SHOULD EXPECT: Some feelings of bloating in the abdomen. Passage of more gas than usual.  Walking can help get rid of the air that was put into your GI tract during the procedure and reduce the bloating. If you had a lower endoscopy (such as a colonoscopy or flexible sigmoidoscopy) you may notice spotting of blood in your stool or on the toilet paper. If you underwent a bowel prep for your procedure, you may not have a normal bowel movement for a few days.  Please Note:  You might notice some irritation and congestion in your nose or some drainage.  This is from the oxygen used during your procedure.  There is no need for concern and it should clear up in a day or so.  SYMPTOMS TO REPORT IMMEDIATELY:  Following lower endoscopy (colonoscopy or flexible sigmoidoscopy):  Excessive amounts of blood in the stool  Significant tenderness or worsening of abdominal pains  Swelling of the abdomen that is new, acute  Fever of 100F or higher  For urgent or emergent issues, a gastroenterologist can be reached at any hour by calling [phone]. Do not use MyChart messaging for urgent concerns.    DIET:  We do recommend a small meal at first, but then you may proceed to your regular diet.  Drink plenty of fluids but you should avoid alcoholic beverages for 24 hours.  ACTIVITY:  You should plan to take it easy for the rest of today and you should NOT DRIVE or use heavy  machinery until tomorrow (because of the sedation medicines used during the test).    FOLLOW UP: Our staff will call the number listed on your records the next business day following your procedure.  We will call around 7:15- 8:00 am to check on you and address any questions or concerns that you may have regarding the information given to you following your procedure. If we do not reach you, we will leave a message.     If any biopsies were taken you will be contacted by phone or by letter within the next 1-3 weeks.  Please call us at [phone] if you have not heard about the biopsies in 3 weeks.    SIGNATURES/CONFIDENTIALITY: You and/or your care partner have signed paperwork which will be entered into your electronic medical record.  These signatures attest to the fact that that the information above on your After Visit Summary has been reviewed and is understood.  Full responsibility of the confidentiality of this discharge information lies with you and/or your care-partner.  

## 2023-02-12 NOTE — Progress Notes (Signed)
Pt resting comfortably. VSS. Airway intact. SBAR complete to RN. All questions answered.   

## 2023-02-12 NOTE — Op Note (Addendum)
[facility] Patient Name: Samuel Luna Procedure Date: 02/12/2023 1:03 PM MRN: [id number] Endoscopist: Nicki Reaper E. Candis Schatz , MD, [id number] Age: 62 Referring MD:  Date of Birth: 01/02/1961 Gender: Male Account #: [id number] Procedure:                Colonoscopy Indications:              Surveillance: Personal history of adenomatous                            polyps on last colonoscopy > 5 years ago (2017, 2                            TAs) Medicines:                Monitored Anesthesia Care Procedure:                Pre-Anesthesia Assessment:                           - Prior to the procedure, a History and Physical                            was performed, and patient medications and                            allergies were reviewed. The patient's tolerance of                            previous anesthesia was also reviewed. The risks                            and benefits of the procedure and the sedation                            options and risks were discussed with the patient.                            All questions were answered, and informed consent                            was obtained. Prior Anticoagulants: The patient has                            taken no anticoagulant or antiplatelet agents. ASA                            Grade Assessment: II - A patient with mild systemic                            disease. After reviewing the risks and benefits,                            the patient was deemed in satisfactory condition to  undergo the procedure.                           After obtaining informed consent, the colonoscope                            was passed under direct vision. Throughout the                            procedure, the patient's blood pressure, pulse, and                            oxygen saturations were monitored continuously. The                            Olympus CF-HQ190L SN [id number] was introduced through                             the anus and advanced to the the terminal ileum,                            with identification of the appendiceal orifice and                            IC valve. The colonoscopy was somewhat difficult                            due to significant looping. Successful completion                            of the procedure was aided by using manual pressure                            and withdrawing and reinserting the scope. The                            patient tolerated the procedure well. The quality                            of the bowel preparation was adequate. The terminal                            ileum, ileocecal valve, appendiceal orifice, and                            rectum were photographed. The bowel preparation                            used was GoLYTELY via split dose instruction. Scope In: 1:06:13 PM Scope Out: 1:28:00 PM Scope Withdrawal Time: 0 hours 12 minutes 3 seconds  Total Procedure Duration: 0 hours 21 minutes 47 seconds  Findings:                 The perianal and digital rectal examinations were  normal. Pertinent negatives include normal                            sphincter tone and no palpable rectal lesions.                           A 4 mm polyp was found in the rectum. The polyp was                            sessile. The polyp was removed with a cold snare.                            Resection and retrieval were complete. Estimated                            blood loss was minimal.                           The exam was otherwise normal throughout the                            examined colon.                           The terminal ileum appeared normal.                           Non-bleeding internal hemorrhoids were found during                            retroflexion. The hemorrhoids were Grade I                            (internal hemorrhoids that do not prolapse).                           No  additional abnormalities were found on                            retroflexion. Complications:            No immediate complications. Estimated Blood Loss:     Estimated blood loss was minimal. Impression:               - One 4 mm polyp in the rectum, removed with a cold                            snare. Resected and retrieved.                           - The examined portion of the ileum was normal.                           - Non-bleeding internal hemorrhoids.                           -  The GI Genius (intelligent endoscopy module),                            computer-aided polyp detection system powered by AI                            was utilized to detect colorectal polyps through                            enhanced visualization during colonoscopy. Recommendation:           - Patient has a contact number available for                            emergencies. The signs and symptoms of potential                            delayed complications were discussed with the                            patient. Return to normal activities tomorrow.                            Written discharge instructions were provided to the                            patient.                           - Resume previous diet.                           - Continue present medications.                           - Await pathology results.                           - Repeat colonoscopy (date not yet determined) for                            surveillance based on pathology results. Sinead Hockman E. Candis Schatz, MD 02/12/2023 1:38:06 PM This report has been signed electronically.

## 2023-02-12 NOTE — Telephone Encounter (Signed)
Samuel Pain, MD 02/08/2023  6:43 AM EST Back to Top    Coronary calcium score of 32, 51st percentile. Minimal less than 25% calcified plaque in the left main artery. Aortic atherosclerosis noted.   Based upon these findings, lets change his atorvastatin 10 mg over to Crestor 10 mg once a day and repeat lipid panel in 2 months.   Candee Furbish, MD    Pt and wife aware of results and orders.  RX [facility] into [facility] [address] and [address].  Will have blood work 04/18/23.

## 2023-02-13 ENCOUNTER — Telehealth: Payer: Self-pay | Admitting: *Deleted

## 2023-02-13 NOTE — Telephone Encounter (Signed)
Follow up call attempt.  Unable to leave a message.

## 2023-02-21 ENCOUNTER — Encounter: Payer: Self-pay | Admitting: Gastroenterology

## 2023-03-06 ENCOUNTER — Other Ambulatory Visit: Payer: Self-pay | Admitting: Internal Medicine

## 2023-03-06 DIAGNOSIS — R0609 Other forms of dyspnea: Secondary | ICD-10-CM

## 2023-03-06 NOTE — Telephone Encounter (Signed)
Requested Prescriptions  Pending Prescriptions Disp Refills   VENTOLIN HFA 108 (90 Base) MCG/ACT inhaler [Pharmacy Med Name: VENTOLIN HFA 90 MCG INHALER] 18 each 0    Sig: TAKE 2 PUFFS BY MOUTH EVERY 6 HOURS AS NEEDED FOR WHEEZE OR SHORTNESS OF BREATH     Pulmonology:  Beta Agonists 2 Failed - 03/06/2023  2:25 AM      Failed - Last BP in normal range    BP Readings from Last 1 Encounters:  02/12/23 (!) 141/62         Passed - Last Heart Rate in normal range    Pulse Readings from Last 1 Encounters:  02/12/23 (!) 51         Passed - Valid encounter within last 12 months    Recent Outpatient Visits           2 months ago Hypothyroidism, unspecified type   Uriah, MD   4 months ago Establishing care with new doctor, encounter for   [place], MD       Future Appointments             In 1 month Wynetta Emery, Dalbert Batman, MD [place]

## 2023-03-16 LAB — LAB REPORT - SCANNED
Creatinine, POC: 46 mg/dL
EGFR: 62

## 2023-04-06 ENCOUNTER — Encounter: Payer: Self-pay | Admitting: Internal Medicine

## 2023-04-18 ENCOUNTER — Ambulatory Visit: Payer: Medicaid Other | Attending: Cardiology

## 2023-04-18 DIAGNOSIS — Z79899 Other long term (current) drug therapy: Secondary | ICD-10-CM

## 2023-04-18 DIAGNOSIS — E7800 Pure hypercholesterolemia, unspecified: Secondary | ICD-10-CM

## 2023-04-18 LAB — LIPID PANEL
Chol/HDL Ratio: 4.4 ratio (ref 0.0–5.0)
Cholesterol, Total: 193 mg/dL (ref 100–199)
HDL: 44 mg/dL (ref >39)
LDL Chol Calc (NIH): 119 mg/dL — ABNORMAL HIGH (ref 0–99)
Triglycerides: 167 mg/dL — ABNORMAL HIGH (ref 0–149)
VLDL Cholesterol Cal: 30 mg/dL (ref 5–40)

## 2023-04-23 ENCOUNTER — Other Ambulatory Visit: Payer: Self-pay | Admitting: Internal Medicine

## 2023-04-23 DIAGNOSIS — G8929 Other chronic pain: Secondary | ICD-10-CM

## 2023-04-23 DIAGNOSIS — R600 Localized edema: Secondary | ICD-10-CM

## 2023-04-23 DIAGNOSIS — R0609 Other forms of dyspnea: Secondary | ICD-10-CM

## 2023-04-24 ENCOUNTER — Other Ambulatory Visit: Payer: Self-pay | Admitting: *Deleted

## 2023-04-24 DIAGNOSIS — Z79899 Other long term (current) drug therapy: Secondary | ICD-10-CM

## 2023-04-24 DIAGNOSIS — E7800 Pure hypercholesterolemia, unspecified: Secondary | ICD-10-CM

## 2023-04-24 MED ORDER — ROSUVASTATIN CALCIUM 20 MG PO TABS: 20.0000 mg | ORAL_TABLET | Freq: Every day | ORAL | 3 refills | Status: DC

## 2023-05-03 ENCOUNTER — Ambulatory Visit: Payer: Medicaid Other | Attending: Internal Medicine | Admitting: Internal Medicine

## 2023-05-03 ENCOUNTER — Encounter: Payer: Self-pay | Admitting: Internal Medicine

## 2023-05-03 VITALS — BP 129/83 | HR 54 | Temp 98.0°F | Ht 72.0 in | Wt 217.0 lb

## 2023-05-03 DIAGNOSIS — R0609 Other forms of dyspnea: Secondary | ICD-10-CM | POA: Diagnosis not present

## 2023-05-03 DIAGNOSIS — Z23 Encounter for immunization: Secondary | ICD-10-CM

## 2023-05-03 DIAGNOSIS — E039 Hypothyroidism, unspecified: Secondary | ICD-10-CM | POA: Diagnosis not present

## 2023-05-03 DIAGNOSIS — R600 Localized edema: Secondary | ICD-10-CM | POA: Diagnosis not present

## 2023-05-03 DIAGNOSIS — E785 Hyperlipidemia, unspecified: Secondary | ICD-10-CM

## 2023-05-03 DIAGNOSIS — I700 Atherosclerosis of aorta: Secondary | ICD-10-CM

## 2023-05-03 DIAGNOSIS — N182 Chronic kidney disease, stage 2 (mild): Secondary | ICD-10-CM

## 2023-05-03 MED ORDER — FUROSEMIDE 20 MG PO TABS: 10.0000 mg | ORAL_TABLET | Freq: Every day | ORAL | 2 refills | Status: DC | PRN

## 2023-05-03 NOTE — Progress Notes (Signed)
Patient ID: Samuel Luna, male    DOB: [birth year]  MRN: [id number]  CC: Hypothyroidism (Hypothyroidism f/u. /SOB when walking. Valentino Hue to Tdap vax. )   Subjective: Samuel Luna is a 62 y.o. male who presents for chronic ds Management.  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  Had coronary CT which revealed calcium score of 32.  Also found to have some aortic atherosclerosis.  Lipitor changed to Crestor 10 mg daily.  He has been taking and tolerating the medication. Patient reports he still gets some DOE when walking to work.  On last visit he told me that he found the albuterol inhaler to be helpful.  We did PFTs. This showed mild obstructive airway disease. Question of weak effort versus effects of upper airway obstruction.   Thyroid:  taking Levothyroxine daily  Saw nephrologist a few mths ago.  Total to continue to stay hydrated and NSAIDs.  GFR was 62.  He had also seen Dr. Candise Che the hematologist for elevated free light chain ratio.  His assessment was that it is unlikely related to plasma cell dyscrasia.  HM:  due for Tdapt and shingles vaccine.  Agreeable to receiving both today. Patient Active Problem List   Diagnosis Date Noted   Stage 3b chronic kidney disease (CKD) (HCC) 12/17/2022   Vitamin D insufficiency 12/17/2022   Alcohol use disorder 12/17/2022   Hyperlipidemia 10/31/2022   History of colon polyps 10/31/2022   Brain injury (HCC)    Hypothyroidism 05/13/2008   HYPERCHOLESTEROLEMIA 05/13/2008   MILD COGNITIVE IMPAIRMENT SO STATED 05/12/2008   KNEE PAIN, LEFT 05/12/2008   SCOLIOSIS 05/12/2008   SKIN RASH 05/12/2008     Current Outpatient Medications on File Prior to Visit  Medication Sig Dispense Refill   diclofenac Sodium (VOLTAREN) 1 % GEL APPLY 2 G  TOPICALLY 2 (TWO) TIMES DAILY AS NEEDED. 100 g 1   furosemide (LASIX) 20 MG tablet TAKE 0.5 TABLET (10 MG TOTAL) BY MOUTH DAILY AS NEEDED 15 tablet 0   levothyroxine (SYNTHROID) 100 MCG tablet Take 1 tablet (100 mcg total) by mouth daily. Dose decreased 30 tablet 3   rosuvastatin (CRESTOR) 20 MG tablet Take 1 tablet (20 mg total) by mouth daily. 90 tablet 3   VENTOLIN HFA 108 (90 Base) MCG/ACT inhaler TAKE 2 PUFFS BY MOUTH EVERY 6 HOURS AS NEEDED FOR WHEEZE OR SHORTNESS OF BREATH 18 each 0   No current facility-administered medications on file prior to visit.    No Known Allergies  Social History   Socioeconomic History   Marital status: Married    Spouse name: Not on file   Number of children: Not on file   Years of education: Not on file   Highest education level: Not on file  Occupational History   Occupation: [employer]  Tobacco Use   Smoking status: Former   Smokeless tobacco: Former   Tobacco comments:    Quit 12 years ago.   Vaping Use   Vaping Use: Never used  Substance and Sexual Activity   Alcohol use: Yes    Comment: occ   Drug use: No   Sexual activity: Not on file  Other Topics Concern   Not on file  Social History Narrative   Not on file   Social Determinants  of Health   Financial Resource Strain: Not on file  Food Insecurity: Not on file  Transportation Needs: Not on file  Physical Activity: Sufficiently Active (01/20/2023)   Exercise Vital Sign    Days of Exercise per Week: 7 days    Minutes of Exercise per Session: 30 min  Stress: Not on file  Social Connections: Not on file  Intimate Partner Violence: Not on file    Family History  Problem Relation Age of Onset   Congestive Heart Failure Mother    Heart attack Maternal Grandmother    Heart attack Maternal Grandfather    Colon cancer Neg Hx    Esophageal cancer Neg Hx    Liver cancer Neg Hx    Pancreatic cancer Neg Hx    Rectal cancer Neg Hx    Stomach cancer Neg Hx    Colon polyps Neg Hx     Crohn's disease Neg Hx    Ulcerative colitis Neg Hx     Past Surgical History:  Procedure Laterality Date   COLONOSCOPY     LYMPHADENECTOMY Left 12/04/2014   2 lymph nodes left arm   POLYPECTOMY      ROS: Review of Systems Negative except as stated above  PHYSICAL EXAM: BP 129/83 (BP Location: Left Arm, Patient Position: Sitting, Cuff Size: Normal)   Pulse (!) 54   Temp 98 F (36.7 C) (Oral)   Ht 6' (1.829 m)   Wt 217 lb (98.4 kg)   SpO2 99%   BMI 29.43 kg/m   Physical Exam   General appearance - alert, well appearing, and in no distress Mental status - normal mood, behavior, speech, dress, motor activity, and thought processes Chest - clear to auscultation, no wheezes, rales or rhonchi, symmetric air entry Heart - normal rate, regular rhythm, normal S1, S2, no murmurs, rubs, clicks or gallops Extremities -trace lower extremity edema     Latest Ref Rng & Units 01/20/2023    8:50 AM 01/16/2023    2:47 PM 01/01/2023    4:28 PM  CMP  Glucose 70 - 99 mg/dL 89  90    BUN 6 - 20 mg/dL 26  18    Creatinine 1.61 - 1.24 mg/dL 0.96  0.45    Sodium 409 - 145 mmol/L 139  142    Potassium 3.5 - 5.1 mmol/L 4.2  4.4    Chloride 98 - 111 mmol/L 104  102    CO2 22 - 32 mmol/L 28  26    Calcium 8.9 - 10.3 mg/dL 9.5  9.6    Total Protein 6.5 - 8.1 g/dL 6.9   6.8   Total Bilirubin 0.3 - 1.2 mg/dL 0.5   0.3   Alkaline Phos 38 - 126 U/L 71   96   AST 15 - 41 U/L 16   16   ALT 0 - 44 U/L 14   13    Lipid Panel     Component Value Date/Time   CHOL 193 04/18/2023 0858   TRIG 167 (H) 04/18/2023 0858   HDL 44 04/18/2023 0858   CHOLHDL 4.4 04/18/2023 0858   CHOLHDL 4.3 Ratio 12/15/2008 2222   VLDL 27 12/15/2008 2222   LDLCALC 119 (H) 04/18/2023 0858    CBC    Component Value Date/Time   WBC 5.4 01/20/2023 0850   WBC 7.9 07/30/2020 1400   RBC 4.26 01/20/2023 0850   HGB 12.9 (L) 01/20/2023 0850   HGB 15.3 10/31/2022 1501   HCT 38.7 (L)  01/20/2023 0850   HCT 47.2  10/31/2022 1501   PLT 146 (L) 01/20/2023 0850   PLT 153 10/31/2022 1501   MCV 90.8 01/20/2023 0850   MCV 94 10/31/2022 1501   MCH 30.3 01/20/2023 0850   MCHC 33.3 01/20/2023 0850   RDW 13.7 01/20/2023 0850   RDW 13.4 10/31/2022 1501   LYMPHSABS 1.4 01/20/2023 0850   MONOABS 0.6 01/20/2023 0850   EOSABS 0.2 01/20/2023 0850   BASOSABS 0.0 01/20/2023 0850    ASSESSMENT AND PLAN:  1. Edema of both legs Refill given on furosemide - furosemide (LASIX) 20 MG tablet; Take 0.5 tablets (10 mg total) by mouth daily as needed.  Dispense: 30 tablet; Refill: 2  2. Hypothyroidism, unspecified type Continue levothyroxine.  TSH done in February of this year was normal.  3. Hyperlipidemia, unspecified hyperlipidemia type Continue Crestor 10 mg daily.  4. Aortic atherosclerosis (HCC) See #3 above.  5. DOE (dyspnea on exertion) Now that he has completed workup with cardiology, we will refer him to pulmonary.  Advised to use the albuterol inhaler about 10 minutes before he starts his walk to work each day. - Ambulatory referral to Pulmonology  6. Need for Tdap vaccination Given today.  7. Need for shingles vaccine First Shingrix vaccine given today.  Advised that the vaccine can cause some soreness and swelling at the injection site.  8. CKD (chronic kidney disease) stage 2, GFR 60-89 ml/min Stable.  Continue to avoid NSAIDs.    Patient was given the opportunity to ask questions.  Patient verbalized understanding of the plan and was able to repeat key elements of the plan.   This documentation was completed using Paediatric nurse.  Any transcriptional errors are unintentional.  No orders of the defined types were placed in this encounter.    Requested Prescriptions    No prescriptions requested or ordered in this encounter    No follow-ups on file.  Jonah Blue, MD, FACP

## 2023-05-28 ENCOUNTER — Emergency Department (HOSPITAL_COMMUNITY): Payer: Medicaid Other

## 2023-05-28 ENCOUNTER — Other Ambulatory Visit: Payer: Self-pay

## 2023-05-28 ENCOUNTER — Emergency Department (HOSPITAL_COMMUNITY)
Admission: EM | Admit: 2023-05-28 | Discharge: 2023-05-28 | Disposition: A | Discharge status: Home / Self Care | Payer: Medicaid Other | Attending: Emergency Medicine | Admitting: Emergency Medicine

## 2023-05-28 ENCOUNTER — Emergency Department (HOSPITAL_BASED_OUTPATIENT_CLINIC_OR_DEPARTMENT_OTHER): Payer: Medicaid Other

## 2023-05-28 DIAGNOSIS — M79672 Pain in left foot: Secondary | ICD-10-CM | POA: Insufficient documentation

## 2023-05-28 DIAGNOSIS — M25562 Pain in left knee: Secondary | ICD-10-CM | POA: Insufficient documentation

## 2023-05-28 DIAGNOSIS — M25561 Pain in right knee: Secondary | ICD-10-CM | POA: Insufficient documentation

## 2023-05-28 DIAGNOSIS — M79671 Pain in right foot: Secondary | ICD-10-CM | POA: Insufficient documentation

## 2023-05-28 DIAGNOSIS — G8929 Other chronic pain: Secondary | ICD-10-CM | POA: Diagnosis not present

## 2023-05-28 DIAGNOSIS — R609 Edema, unspecified: Secondary | ICD-10-CM | POA: Diagnosis not present

## 2023-05-28 DIAGNOSIS — R2243 Localized swelling, mass and lump, lower limb, bilateral: Secondary | ICD-10-CM | POA: Diagnosis not present

## 2023-05-28 MED ORDER — HYDROCODONE-ACETAMINOPHEN 5-325 MG PO TABS: 1.0000 | ORAL_TABLET | Freq: Four times a day (QID) | ORAL | 0 refills | Status: DC | PRN

## 2023-05-28 MED ORDER — HYDROCODONE-ACETAMINOPHEN 5-325 MG PO TABS
1.0000 | ORAL_TABLET | Freq: Once | ORAL | Status: AC
  Administered 2023-05-28: 1 via ORAL
  Filled 2023-05-28: qty 1

## 2023-05-28 NOTE — Discharge Instructions (Signed)
Return for any problem.  Follow up with your Orthopedic outpatient care provider as instructed.

## 2023-05-28 NOTE — Progress Notes (Signed)
Lower extremity venous bilateral study completed.  Preliminary results relayed to Mess[place], MD.   See CV Proc for preliminary results report.   Boni Maclellan, RDMS, RVT    

## 2023-05-28 NOTE — ED Triage Notes (Signed)
Pt reports bilateral knee and feet swelling since Saturday, left worse than right. Denies injury. Arthritis in both knees

## 2023-05-28 NOTE — ED Notes (Signed)
Called for [facility]

## 2023-05-28 NOTE — Progress Notes (Signed)
Orthopedic Tech Progress Note Patient Details:  Abraham Entwistle 1961/02/09 [id number]  Ortho Devices Type of Ortho Device: Ace wrap, Knee Immobilizer Ortho Device/Splint Location: LLE, RLE Ortho Device/Splint Interventions: Ordered, Application, Adjustment   Post Interventions Patient Tolerated: Well Instructions Provided: Care of device, Adjustment of device  [place] A Gerilyn Pilgrim 05/28/2023, 1:37 PM

## 2023-05-28 NOTE — ED Provider Notes (Signed)
[place] EMERGENCY DEPARTMENT AT [facility] Provider Note   CSN: [id number] Arrival date & time: 05/28/23  1003     History  Chief Complaint  Patient presents with   Knee Pain   Foot Pain    Marcellus Pulliam is a 62 y.o. male.  62 year old male with prior medical history detailed below presents for evaluation.  Patient complains of bilateral knee pain.  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  Patient reports that the pain in his knees has become more uncomfortable over the last 48 hours.  He denies any specific inciting event or fall or trauma.  The patient apparently does bike to work every day.  It is possible, per his report, that he did something to exacerbate his pain with biking.  He has not taken anything for his pain.  He uses Voltaren gel as needed for pain.  He also noted some mild increased edema in his feet.  He admits that over the last 2 days he has been at home with minimal ambulation because of pain from his knees.  He denies fever, rash, chest pain, shortness of breath, nausea, vomiting, weakness, other complaint.  The history is provided by the patient, medical records and the EMS personnel.       Home Medications Prior to Admission medications   Medication Sig Start Date End Date Taking? Authorizing Provider  diclofenac Sodium (VOLTAREN) 1 % GEL APPLY 2 G TOPICALLY 2 (TWO) TIMES DAILY AS NEEDED. 12/22/22   Marcine Matar, MD  furosemide (LASIX) 20 MG tablet Take 0.5 tablets (10 mg total) by mouth daily as needed. 05/03/23   Marcine Matar, MD  levothyroxine (SYNTHROID) 100 MCG tablet Take 1 tablet (100 mcg total) by mouth daily. Dose decreased 01/02/23   Marcine Matar, MD  rosuvastatin (CRESTOR) 20 MG tablet Take 1 tablet (20 mg total) by mouth daily. 04/24/23   Jake Bathe, MD  VENTOLIN HFA 108 (90 Base) MCG/ACT inhaler TAKE 2 PUFFS BY MOUTH EVERY 6 HOURS AS NEEDED FOR WHEEZE OR SHORTNESS OF BREATH 04/24/23    Marcine Matar, MD      Allergies    Patient has no known allergies.    Review of Systems   Review of Systems  All other systems reviewed and are negative.   Physical Exam Updated Vital Signs BP (!) 143/82   Pulse 66   Temp 98.1 F (36.7 C) (Oral)   Resp 16   Ht 6' (1.829 m)   Wt 95.3 kg   SpO2 96%   BMI 28.48 kg/m  Physical Exam  ED Results / Procedures / Treatments   Labs (all labs ordered are listed, but only abnormal results are displayed) Labs Reviewed - No data to display  EKG None  Radiology No results found.  Procedures Procedures    Medications Ordered in ED Medications - No data to display  ED Course/ Medical Decision Making/ A&P                             Medical Decision Making Amount and/or Complexity of Data Reviewed Radiology: ordered.  Risk Prescription drug management.    Medical Screen Complete  This patient presented to the ED with complaint of bilateral knee pain.  This complaint involves an extensive number of treatment options. The initial differential diagnosis includes, but is not limited to, osteoarthritis, DVT, muscular strain, etc.  This presentation is: Acute,  Chronic, Self-Limited, Previously Undiagnosed, Uncertain Prognosis, Complicated, Systemic Symptoms, and Threat to Life/Bodily Function  Patient with known history of bilateral osteoarthritis affecting both knees.  Patient with increased pain x 2 to 3 days.  Suspect overuse and resulting increased pain to both knees.  No evidence of acute fracture on imaging.  DVT study is negative.  Patient feels improved with administration of pain medication here in the ED.  Patient does have established outpatient orthopedic care.  Patient advised to closely follow-up with same.  Strict return precautions given and understood.  Portance of close follow-up is stressed.  Additional history obtained: External records from outside sources obtained and reviewed including  prior ED visits and prior Inpatient records.    Lab Tests:  I ordered and personally interpreted labs.   Imaging Studies ordered:  I ordered imaging studies including bilateral knee films I independently visualized and interpreted obtained imaging which showed no acute fracture I agree with the radiologist interpretation.   Medicines ordered:  I ordered medication including Norco for pain Reevaluation of the patient after these medicines showed that the patient: improved    Problem List / ED Course:  Bilateral knee pain   Reevaluation:  After the interventions noted above, I reevaluated the patient and found that they have: improved   Disposition:  After consideration of the diagnostic results and the patients response to treatment, I feel that the patent would benefit from close outpatient follow-up.          Final Clinical Impression(s) / ED Diagnoses Final diagnoses:  Chronic pain of both knees    Rx / DC Orders ED Discharge Orders     None         Wynetta Fines, MD 05/28/23 1751

## 2023-06-05 ENCOUNTER — Encounter: Payer: Self-pay | Admitting: Physician Assistant

## 2023-06-05 ENCOUNTER — Ambulatory Visit (INDEPENDENT_AMBULATORY_CARE_PROVIDER_SITE_OTHER): Payer: Medicaid Other | Admitting: Physician Assistant

## 2023-06-05 DIAGNOSIS — M1711 Unilateral primary osteoarthritis, right knee: Secondary | ICD-10-CM

## 2023-06-05 MED ORDER — BUPIVACAINE HCL 0.25 % IJ SOLN
2.0000 mL | INTRAMUSCULAR | Status: AC | PRN
Start: 2023-06-05 — End: 2023-06-05
  Administered 2023-06-05: 2 mL via INTRA_ARTICULAR

## 2023-06-05 MED ORDER — METHYLPREDNISOLONE ACETATE 40 MG/ML IJ SUSP
80.0000 mg | INTRAMUSCULAR | Status: AC | PRN
Start: 2023-06-05 — End: 2023-06-05
  Administered 2023-06-05: 80 mg via INTRA_ARTICULAR

## 2023-06-05 MED ORDER — LIDOCAINE HCL 1 % IJ SOLN
5.0000 mL | INTRAMUSCULAR | Status: AC | PRN
Start: 2023-06-05 — End: 2023-06-05
  Administered 2023-06-05: 5 mL

## 2023-06-05 NOTE — Progress Notes (Signed)
Office Visit Note   Patient: Samuel Luna           Date of Birth: [birth year]           MRN: [id number] Visit Date: 06/05/2023              Requested by: Marcine Matar, MD [address] [place] [address] [place],  [place] [address] PCP: Marcine Matar, MD  No chief complaint on file.     HPI: Patient is a pleasant 62 year old gentleman who has a history of left knee pain and effusion.  He did have an aspiration and steroid injection into that knee and has not had any problems since.  He is now complaining of right knee tightness and swelling.  Denies any injuries denies any change in activity.  He does go on his feet quite a bit for his job.  Denies any fever or chills.  Was seen in the emergency room recently for this problem.  X-rays were taken that did not show any acute osseous abnormality.  Rates his pain as moderate  Assessment & Plan: Visit Diagnoses: Right knee pain and effusion   Plan: Right knee effusion.  No evidence of any infective process.  Previous effusion on the left did not show anything when it was sent for evaluation.  I did aspirate 50 cc of yellow-tinged fluid from his knee injected him with some steroid.  He is going to see how he does and follow-up with me in 3 weeks.  If he still had recurrent effusions might be worth getting lab work for an inflammatory arthropathy or an MRI  Follow-Up Instructions: Return in about 3 weeks (around 06/26/2023).   Ortho Exam  Patient is alert, oriented, no adenopathy, well-dressed, normal affect, normal respiratory effort. Right knee has a moderate effusion.  No erythema or cellulitis he does have quite a bit of quad atrophy.  Compartments are soft and nontender.  He is neurovascularly intact.  Has good activation of his patella and quadricep tendons.  Motion is limited by the effusion.  Imaging: No results found. No images are attached to the encounter.  Labs: Lab Results  Component Value Date   REPTSTATUS 10/15/2007  FINAL 10/12/2007   GRAMSTAIN  10/12/2007    NO WBC SEEN NO SQUAMOUS EPITHELIAL CELLS SEEN FEW GRAM POSITIVE COCCI IN CLUSTERS   CULT  10/12/2007    ABUNDANT METHICILLIN RESISTANT STAPHYLOCOCCUS AUREUS Note: RIFAMPIN AND GENTAMICIN SHOULD NOT BE USED AS SINGLE DRUGS FOR TREATMENT OF STAPH INFECTIONS. CRITICAL RESULT CALLED TO, READ BACK BY AND VERIFIED WITH: TONY S 10/15/07 AT 900 AM BY MORAC   LABORGA METHICILLIN RESISTANT STAPHYLOCOCCUS AUREUS 10/12/2007     Lab Results  Component Value Date   ALBUMIN 4.3 01/20/2023   ALBUMIN 4.3 01/01/2023   ALBUMIN 5.5 (H) 11/07/2022    No results found for: "MG" Lab Results  Component Value Date   VD25OH 22.7 (L) 11/09/2022    No results found for: "PREALBUMIN"    Latest Ref Rng & Units 01/20/2023    8:50 AM 10/31/2022    3:01 PM 07/30/2020    2:00 PM  CBC EXTENDED  WBC 4.0 - 10.5 K/uL 5.4  5.8  7.9   RBC 4.22 - 5.81 MIL/uL 4.26  5.03  5.12   Hemoglobin 13.0 - 17.0 g/dL 65.7  84.6  96.2   HCT 39.0 - 52.0 % 38.7  47.2  47.6   Platelets 150 - 400 K/uL 146  153  195  NEUT# 1.7 - 7.7 K/uL 3.3   6.1   Lymph# 0.7 - 4.0 K/uL 1.4   1.1      There is no height or weight on file to calculate BMI.  Orders:  No orders of the defined types were placed in this encounter.  No orders of the defined types were placed in this encounter.    Procedures: Large Joint Inj on 06/05/2023 10:34 AM Indications: pain and diagnostic evaluation Details: 25 G 1.5 in needle, superolateral approach  Arthrogram: No  Medications: 80 mg methylPREDNISolone acetate 40 MG/ML; 5 mL lidocaine 1 %; 2 mL bupivacaine 0.25 % Outcome: tolerated well, no immediate complications  After verbal consent was obtained superior lateral portion of the right knee was prepped with alcohol followed by Betadine.  3 cc of lidocaine plain was injected.  After adequate analgesia knee was reprepped in the same manner.  18-gauge needle was inserted and 50 cc of straw-colored fluid was  aspirated.  80 cc Depo-Medrol and 2 cc of bupivacaine were injected Ace wrap was applied Procedure, treatment alternatives, risks and benefits explained, specific risks discussed. Consent was given by the patient.    Clinical Data: No additional findings.  ROS:  All other systems negative, except as noted in the HPI. Review of Systems  Objective: Vital Signs: There were no vitals taken for this visit.  Specialty Comments:  No specialty comments available.  PMFS History: Patient Active Problem List   Diagnosis Date Noted   Stage 3b chronic kidney disease (CKD) (HCC) 12/17/2022   Vitamin D insufficiency 12/17/2022   Alcohol use disorder 12/17/2022   Hyperlipidemia 10/31/2022   History of colon polyps 10/31/2022   Brain injury (HCC)    Hypothyroidism 05/13/2008   HYPERCHOLESTEROLEMIA 05/13/2008   MILD COGNITIVE IMPAIRMENT SO STATED 05/12/2008   Bilateral chronic knee pain 05/12/2008   SCOLIOSIS 05/12/2008   SKIN RASH 05/12/2008   Past Medical History:  Diagnosis Date   Arthritis    knees   Brain injury (HCC)    Chronic kidney disease    mild   GERD (gastroesophageal reflux disease)    Scoliosis    Thyroid disease    hypothyroidism    Family History  Problem Relation Age of Onset   Congestive Heart Failure Mother    Heart attack Maternal Grandmother    Heart attack Maternal Grandfather    Colon cancer Neg Hx    Esophageal cancer Neg Hx    Liver cancer Neg Hx    Pancreatic cancer Neg Hx    Rectal cancer Neg Hx    Stomach cancer Neg Hx    Colon polyps Neg Hx    Crohn's disease Neg Hx    Ulcerative colitis Neg Hx     Past Surgical History:  Procedure Laterality Date   COLONOSCOPY     LYMPHADENECTOMY Left 12/04/2014   2 lymph nodes left arm   POLYPECTOMY     Social History   Occupational History   Occupation: [employer]  Tobacco Use   Smoking status: Former   Smokeless tobacco: Former   Tobacco comments:    Quit 12 years ago.   Vaping Use   Vaping  Use: Never used  Substance and Sexual Activity   Alcohol use: Yes    Comment: occ   Drug use: No   Sexual activity: Not on file

## 2023-06-07 LAB — LAB REPORT - SCANNED
Albumin, Urine POC: 11
Creatinine, POC: 191.1 mg/dL
EGFR: 55
Microalb Creat Ratio: 6

## 2023-06-20 ENCOUNTER — Other Ambulatory Visit: Payer: Self-pay | Admitting: Internal Medicine

## 2023-06-20 DIAGNOSIS — E039 Hypothyroidism, unspecified: Secondary | ICD-10-CM

## 2023-06-20 DIAGNOSIS — R0609 Other forms of dyspnea: Secondary | ICD-10-CM

## 2023-06-27 ENCOUNTER — Ambulatory Visit: Payer: Medicaid Other | Attending: Internal Medicine

## 2023-07-24 ENCOUNTER — Ambulatory Visit: Payer: Self-pay

## 2023-07-24 NOTE — Telephone Encounter (Signed)
  Chief Complaint: SOB at rest and with exertion, Albuterol is not working and having to take more puffs to achieve desired effect.  Symptoms: SOB  Pertinent Negatives: Patient denies wheezing or coughing Disposition: [] ED /[] Urgent Care (no appt availability in office) / [] Appointment(In office/virtual)/ []  [facility]/ [] Home Care/ [] Refused Recommended Disposition /[x] [facility]/ []  Follow-up with PCP Additional Notes: needs U/C  Reason for Disposition  [1] MILD difficulty breathing (e.g., minimal/no SOB at rest, SOB with walking, pulse <100) AND [2] NEW-onset or WORSE than normal  Answer Assessment - Initial Assessment Questions 1. RESPIRATORY STATUS: "Describe your breathing?" (e.g., wheezing, shortness of breath, unable to speak, severe coughing)      SOB at rest and exertion  2. ONSET: "When did this breathing problem begin?"      *No Answer* 3. PATTERN "Does the difficult breathing come and go, or has it been constant since it started?"      *No Answer* 4. SEVERITY: "How bad is your breathing?" (e.g., mild, moderate, severe)    - MILD: No SOB at rest, mild SOB with walking, speaks normally in sentences, can lie down, no retractions, pulse < 100.    - MODERATE: SOB at rest, SOB with minimal exertion and prefers to sit, cannot lie down flat, speaks in phrases, mild retractions, audible wheezing, pulse 100-120.    - SEVERE: Very SOB at rest, speaks in single words, struggling to breathe, sitting hunched forward, retractions, pulse > 120      moderate 5. RECURRENT SYMPTOM: "Have you had difficulty breathing before?" If Yes, ask: "When was the last time?" and "What happened that time?"      moderate 6. CARDIAC HISTORY: "Do you have any history of heart disease?" (e.g., heart attack, angina, bypass surgery, angioplasty)      *No Answer* 7. LUNG HISTORY: "Do you have any history of lung disease?"  (e.g., pulmonary embolus, asthma, emphysema)     *No Answer* 8.  CAUSE: "What do you think is causing the breathing problem?"      Inhalers not worker 9. OTHER SYMPTOMS: "Do you have any other symptoms? (e.g., dizziness, runny nose, cough, chest pain, fever)     Hard to breathe 10. O2 SATURATION MONITOR:  "Do you use an oxygen saturation monitor (pulse oximeter) at home?" If Yes, ask: "What is your reading (oxygen level) today?" "What is your usual oxygen saturation reading?" (e.g., 95%)       *No Answer* 11. PREGNANCY: "Is there any chance you are pregnant?" "When was your last menstrual period?"       *No Answer* 12. TRAVEL: "Have you traveled out of the country in the last month?" (e.g., travel history, exposures)       *No Answer*  Protocols used: Breathing Difficulty-A-AH

## 2023-07-24 NOTE — Telephone Encounter (Signed)
Pt has appointment tomorrow to see PA to discuss concerns.

## 2023-07-25 ENCOUNTER — Telehealth (HOSPITAL_BASED_OUTPATIENT_CLINIC_OR_DEPARTMENT_OTHER): Payer: Medicaid Other | Admitting: Physician Assistant

## 2023-07-25 DIAGNOSIS — R062 Wheezing: Secondary | ICD-10-CM | POA: Diagnosis not present

## 2023-07-25 MED ORDER — PREDNISONE 10 MG PO TABS
ORAL_TABLET | ORAL | 0 refills | Status: DC
Start: 2023-07-25 — End: 2023-08-16

## 2023-07-25 NOTE — Progress Notes (Signed)
Patient ID: Samuel Luna, male   DOB: 08-30-1961, 62 y.o.   MRN: [id number] Virtual Visit via Video Note  I connected with [name] on 07/25/23 at  3:30 PM EDT by a video enabled telemedicine application and verified that I am speaking with the correct person using two identifiers.  Location: Patient: home Provider: [facility] office   I discussed the limitations of evaluation and management by telemedicine and the availability of in person appointments. The patient expressed understanding and agreed to proceed.  History of Present Illness: Increased wheezing over the last few days and having to use albuterol more frequently.  No CP or chest pressure.  He does not have diabetes.  Some cough.  No discolored mucus.  No fever.    Observations/Objective: NAD.  No labored breathing.  A&Ox3   Assessment and Plan: 1. Wheezing Continue albuterol as needed.  Advised to call 911 if any CP/DOE/swelling/dizziness.  Patient verbalized understanding - predniSONE (DELTASONE) 10 MG tablet; 6,5,4,3,2,1 take each day's dose at once in the morning with food  Dispense: 21 tablet; Refill: 0    Follow Up Instructions: As next scheduled   I discussed the assessment and treatment plan with the patient. The patient was provided an opportunity to ask questions and all were answered. The patient agreed with the plan and demonstrated an understanding of the instructions.   The patient was advised to call back or seek an in-person evaluation if the symptoms worsen or if the condition fails to improve as anticipated.  I provided 11 minutes of non-face-to-face time during this encounter.   Georgian Co, PA-C

## 2023-08-06 ENCOUNTER — Encounter (HOSPITAL_COMMUNITY): Payer: Self-pay

## 2023-08-06 ENCOUNTER — Emergency Department (HOSPITAL_COMMUNITY)
Admission: EM | Admit: 2023-08-06 | Discharge: 2023-08-06 | Disposition: A | Discharge status: Home / Self Care | Payer: Medicaid Other | Attending: Emergency Medicine | Admitting: Emergency Medicine

## 2023-08-06 ENCOUNTER — Other Ambulatory Visit: Payer: Self-pay

## 2023-08-06 DIAGNOSIS — S39012A Strain of muscle, fascia and tendon of lower back, initial encounter: Secondary | ICD-10-CM | POA: Insufficient documentation

## 2023-08-06 DIAGNOSIS — M545 Low back pain, unspecified: Secondary | ICD-10-CM | POA: Diagnosis present

## 2023-08-06 DIAGNOSIS — X58XXXA Exposure to other specified factors, initial encounter: Secondary | ICD-10-CM | POA: Insufficient documentation

## 2023-08-06 MED ORDER — LIDOCAINE 5 % EX PTCH
1.0000 | MEDICATED_PATCH | CUTANEOUS | Status: DC
  Administered 2023-08-06: 1 via TRANSDERMAL
  Filled 2023-08-06: qty 1

## 2023-08-06 MED ORDER — ACETAMINOPHEN 325 MG PO TABS
650.0000 mg | ORAL_TABLET | Freq: Once | ORAL | Status: AC
  Administered 2023-08-06: 650 mg via ORAL
  Filled 2023-08-06: qty 2

## 2023-08-06 MED ORDER — CYCLOBENZAPRINE HCL 10 MG PO TABS: 10.0000 mg | ORAL_TABLET | Freq: Two times a day (BID) | ORAL | 0 refills | Status: DC | PRN

## 2023-08-06 MED ORDER — LIDOCAINE 5 % EX PTCH: 1.0000 | MEDICATED_PATCH | CUTANEOUS | 0 refills | Status: DC

## 2023-08-06 NOTE — Discharge Instructions (Addendum)
Please follow-up with your primary care provider regarding recent symptoms and ER visit.  Today your exam was reassuring and you most likely have a muscle strain.  You may take Tylenol every 6 hours needed for pain and use ice packs or heat packs.  I have prescribed you Flexeril which is a muscle relaxer however does have the side effect of sedation and so please do not operate machinery or drive after taking this medication and only use it before bedtime.  You may use the lidocaine patches I have also prescribed for you as well.  If symptoms change or worsen please return to ER.

## 2023-08-06 NOTE — ED Triage Notes (Addendum)
Patient began having lower middle back pain Wednesday after doing yard work. Throbbing pain.

## 2023-08-06 NOTE — ED Provider Notes (Signed)
[place] EMERGENCY DEPARTMENT AT [facility] Provider Note   CSN: [id number] Arrival date & time: 08/06/23  1037     History  Chief Complaint  Patient presents with   Back Pain    Samuel Luna is a 62 y.o. male history scoliosis presented with back pain for the past 4 days.  Patient notes that he was mowing the lawn and then afterwards noted discomfort in the left side of his back.  Patient's been able to walk but states that hurts to move his back.  Patient denies saddle anesthesia, urinary/bowel incontinence, new onset weakness, changes sensation/motor skills, fevers, IVDU, falls or other trauma, fever, night sweats no history of cancer.  Patient notes that the pain is in his lower back.  Patient denies chest pain, shortness of breath, abdominal pain, nausea/vomiting  Home Medications Prior to Admission medications   Medication Sig Start Date End Date Taking? Authorizing Provider  cyclobenzaprine (FLEXERIL) 10 MG tablet Take 1 tablet (10 mg total) by mouth 2 (two) times daily as needed for muscle spasms. 08/06/23  Yes Patsey Pitstick, Fayrene Fearing T, PA-C  lidocaine (LIDODERM) 5 % Place 1 patch onto the skin daily. Remove & Discard patch within 12 hours or as directed by MD 08/06/23  Yes Blaire Hodsdon, Beverly Gust, PA-C  albuterol (VENTOLIN HFA) 108 (90 Base) MCG/ACT inhaler TAKE 2 PUFFS BY MOUTH EVERY 6 HOURS AS NEEDED FOR WHEEZE OR SHORTNESS OF BREATH 06/20/23   Marcine Matar, MD  diclofenac Sodium (VOLTAREN) 1 % GEL APPLY 2 G TOPICALLY 2 (TWO) TIMES DAILY AS NEEDED. 12/22/22   Marcine Matar, MD  furosemide (LASIX) 20 MG tablet Take 0.5 tablets (10 mg total) by mouth daily as needed. 05/03/23   Marcine Matar, MD  HYDROcodone-acetaminophen (NORCO/VICODIN) 5-325 MG tablet Take 1 tablet by mouth every 6 (six) hours as needed. 05/28/23   Wynetta Fines, MD  levothyroxine (SYNTHROID) 100 MCG tablet TAKE 1 TABLET (100 MCG TOTAL) BY MOUTH DAILY. DOSE DECREASED 06/20/23   Marcine Matar, MD   predniSONE (DELTASONE) 10 MG tablet 6,5,4,3,2,1 take each day's dose at once in the morning with food 07/25/23   Georgian Co M, PA-C  rosuvastatin (CRESTOR) 20 MG tablet Take 1 tablet (20 mg total) by mouth daily. 04/24/23   Jake Bathe, MD      Allergies    Patient has no known allergies.    Review of Systems   Review of Systems  Musculoskeletal:  Positive for back pain.    Physical Exam Updated Vital Signs BP 132/89   Pulse 78   Temp 97.7 F (36.5 C) (Oral)   Resp 17   Ht 6' (1.829 m)   Wt 95 kg   SpO2 98%   BMI 28.40 kg/m  Physical Exam Constitutional:      General: He is not in acute distress. Cardiovascular:     Rate and Rhythm: Normal rate.     Pulses: Normal pulses.  Abdominal:     Palpations: Abdomen is soft.     Tenderness: There is no abdominal tenderness. There is no guarding or rebound.  Musculoskeletal:        General: Normal range of motion.     Comments: Tenderness palpation left paralumbar muscles without abnormalities palpated 5 out of 5 bilateral hip flexion No midline tenderness or abnormalities palpated  Skin:    General: Skin is warm and dry.     Capillary Refill: Capillary refill takes less than 2 seconds.  Neurological:  Mental Status: He is alert.     Comments: Sensation intact distally 2+ bilateral patellar reflexes Able to ambulate without difficulty does endorse pain when ambulating  Psychiatric:        Mood and Affect: Mood normal.     ED Results / Procedures / Treatments   Labs (all labs ordered are listed, but only abnormal results are displayed) Labs Reviewed - No data to display  EKG None  Radiology No results found.  Procedures Procedures    Medications Ordered in ED Medications  acetaminophen (TYLENOL) tablet 650 mg (has no administration in time range)  lidocaine (LIDODERM) 5 % 1 patch (has no administration in time range)    ED Course/ Medical Decision Making/ A&P                                  Medical Decision Making Risk OTC drugs. Prescription drug management.   [name] 62 y.o. presented today for back pain. Working DDx that I considered at this time includes, but not limited to, MSK, underlying fracture, epidural hematoma/abscess, cauda equina syndrome, spinal stenosis, spinal malignancy, discitis, spinal infection, spondylitises/ spondylosis, conus medullaris, DDD of the back.  R/o DDx: underlying fracture, epidural hematoma/abscess, cauda equina syndrome, spinal stenosis, spinal malignancy, discitis, spinal infection, spondylitises/ spondylosis, conus medullaris, DDD of the back : less likely due to history of present illness, physical exam, labs/imaging findings.  Review of prior external notes: 05/28/2023 ED  Unique Tests and My Interpretation: None  Discussion with Independent Historian:  Significant other  Discussion of Management of Tests: None  Risk: Medium: prescription drug management  Risk Stratification Score: none  Plan: On exam patient was in no acute distress with stable vitals. No neurological deficits and normal neuro exam.  Patient can walk but states is painful.  No loss of bowel or bladder control.  No concern for cauda equina.  No fever, night sweats, weight loss, h/o cancer, IVDU.  RICE protocol and pain medicine indicated and discussed with patient.   Patient was given return precautions. Patient stable for discharge at this time.  Patient verbalized understanding of plan.         Final Clinical Impression(s) / ED Diagnoses Final diagnoses:  Strain of lumbar region, initial encounter    Rx / DC Orders ED Discharge Orders          Ordered    lidocaine (LIDODERM) 5 %  Every 24 hours        08/06/23 1152    cyclobenzaprine (FLEXERIL) 10 MG tablet  2 times daily PRN        08/06/23 1152              Remi Deter 08/06/23 1251    Benjiman Core, MD 08/06/23 [phone]

## 2023-08-16 ENCOUNTER — Emergency Department (HOSPITAL_COMMUNITY): Payer: Medicaid Other

## 2023-08-16 ENCOUNTER — Other Ambulatory Visit: Payer: Self-pay

## 2023-08-16 ENCOUNTER — Emergency Department (HOSPITAL_COMMUNITY)
Admission: EM | Admit: 2023-08-16 | Discharge: 2023-08-16 | Disposition: A | Discharge status: Home / Self Care | Payer: Medicaid Other | Attending: Emergency Medicine | Admitting: Emergency Medicine

## 2023-08-16 ENCOUNTER — Encounter (HOSPITAL_COMMUNITY): Payer: Self-pay

## 2023-08-16 DIAGNOSIS — J441 Chronic obstructive pulmonary disease with (acute) exacerbation: Secondary | ICD-10-CM | POA: Diagnosis not present

## 2023-08-16 DIAGNOSIS — Z20822 Contact with and (suspected) exposure to covid-19: Secondary | ICD-10-CM | POA: Insufficient documentation

## 2023-08-16 DIAGNOSIS — R0602 Shortness of breath: Secondary | ICD-10-CM | POA: Diagnosis present

## 2023-08-16 LAB — TROPONIN I (HIGH SENSITIVITY)
Troponin I (High Sensitivity): 2 ng/L (ref <18)
Troponin I (High Sensitivity): 2 ng/L (ref <18)

## 2023-08-16 LAB — BASIC METABOLIC PANEL
Anion gap: 8 (ref 5–15)
BUN: 12 mg/dL (ref 8–23)
CO2: 27 mmol/L (ref 22–32)
Calcium: 9.2 mg/dL (ref 8.9–10.3)
Chloride: 100 mmol/L (ref 98–111)
Creatinine, Ser: 1.37 mg/dL — ABNORMAL HIGH (ref 0.61–1.24)
GFR, Estimated: 59 mL/min — ABNORMAL LOW (ref >60)
Glucose, Bld: 102 mg/dL — ABNORMAL HIGH (ref 70–99)
Potassium: 4.3 mmol/L (ref 3.5–5.1)
Sodium: 135 mmol/L (ref 135–145)

## 2023-08-16 LAB — SARS CORONAVIRUS 2 BY RT PCR: SARS Coronavirus 2 by RT PCR: NEGATIVE

## 2023-08-16 LAB — D-DIMER, QUANTITATIVE: D-Dimer, Quant: <0.27 ug{FEU}/mL (ref 0.00–0.50)

## 2023-08-16 LAB — CBC
HCT: 42.5 % (ref 39.0–52.0)
Hemoglobin: 13.4 g/dL (ref 13.0–17.0)
MCH: 30 pg (ref 26.0–34.0)
MCHC: 31.5 g/dL (ref 30.0–36.0)
MCV: 95.3 fL (ref 80.0–100.0)
Platelets: 181 10*3/uL (ref 150–400)
RBC: 4.46 MIL/uL (ref 4.22–5.81)
RDW: 14.5 % (ref 11.5–15.5)
WBC: 8.3 10*3/uL (ref 4.0–10.5)
nRBC: 0 % (ref 0.0–0.2)

## 2023-08-16 LAB — BRAIN NATRIURETIC PEPTIDE: B Natriuretic Peptide: 17 pg/mL (ref 0.0–100.0)

## 2023-08-16 MED ORDER — PREDNISONE 20 MG PO TABS: ORAL_TABLET | ORAL | 0 refills | Status: DC

## 2023-08-16 MED ORDER — BENZONATATE 100 MG PO CAPS: 100.0000 mg | ORAL_CAPSULE | Freq: Three times a day (TID) | ORAL | 0 refills | Status: DC

## 2023-08-16 MED ORDER — IPRATROPIUM-ALBUTEROL 0.5-2.5 (3) MG/3ML IN SOLN
3.0000 mL | Freq: Once | RESPIRATORY_TRACT | Status: AC
  Administered 2023-08-16: 3 mL via RESPIRATORY_TRACT
  Filled 2023-08-16: qty 3

## 2023-08-16 MED ORDER — DEXAMETHASONE SODIUM PHOSPHATE 10 MG/ML IJ SOLN
10.0000 mg | Freq: Once | INTRAMUSCULAR | Status: AC
  Administered 2023-08-16: 10 mg via INTRAVENOUS
  Filled 2023-08-16: qty 1

## 2023-08-16 MED ORDER — AZITHROMYCIN 250 MG PO TABS
ORAL_TABLET | ORAL | 0 refills | Status: DC
Start: 2023-08-16 — End: 2024-02-28

## 2023-08-16 NOTE — ED Triage Notes (Addendum)
Patient BIB [facility] from home. Complaining of shortness of breath increasing over past 3 days. Has a productive cough, thick and green sputum. Left lung had expiratory wheezes. 100% room air in triage.

## 2023-08-16 NOTE — ED Notes (Signed)
Pt still wheezing after breathing tx

## 2023-08-16 NOTE — ED Provider Notes (Signed)
[facility] AT [facility] Provider Note   CSN: [id number] Arrival date & time: 08/16/23  4098     History  Chief Complaint  Patient presents with   Shortness of Breath    Samuel Luna is a 62 y.o. male.  The history is provided by the patient, the spouse and medical records. No language interpreter was used.  Shortness of Breath    Samuel Luna is a 62 y.o. male with history of chronic wheezing presenting to the ED with a complaint of worsening shortness of breath over the past 3 days. He says shortness of breath and wheezing which have been managed with albuterol inhaler in the past, but that he has been unable to control symptoms with the inhaler since symptoms worsened. He feels like he cannot walk as far and was unable to get up off his couch due to his symptoms. Endorses associated productive cough and increased yellow/green sputum. He denies fever, recent illness, or edema.   Home Medications Prior to Admission medications   Medication Sig Start Date End Date Taking? Authorizing Provider  albuterol (VENTOLIN HFA) 108 (90 Base) MCG/ACT inhaler TAKE 2 PUFFS BY MOUTH EVERY 6 HOURS AS NEEDED FOR WHEEZE OR SHORTNESS OF BREATH 06/20/23   Marcine Matar, MD  cyclobenzaprine (FLEXERIL) 10 MG tablet Take 1 tablet (10 mg total) by mouth 2 (two) times daily as needed for muscle spasms. 08/06/23   Netta Corrigan, PA-C  diclofenac Sodium (VOLTAREN) 1 % GEL APPLY 2 G TOPICALLY 2 (TWO) TIMES DAILY AS NEEDED. 12/22/22   Marcine Matar, MD  furosemide (LASIX) 20 MG tablet Take 0.5 tablets (10 mg total) by mouth daily as needed. 05/03/23   Marcine Matar, MD  HYDROcodone-acetaminophen (NORCO/VICODIN) 5-325 MG tablet Take 1 tablet by mouth every 6 (six) hours as needed. 05/28/23   Wynetta Fines, MD  levothyroxine (SYNTHROID) 100 MCG tablet TAKE 1 TABLET (100 MCG TOTAL) BY MOUTH DAILY. DOSE DECREASED 06/20/23   Marcine Matar, MD  lidocaine  (LIDODERM) 5 % Place 1 patch onto the skin daily. Remove & Discard patch within 12 hours or as directed by MD 08/06/23   Netta Corrigan, PA-C  predniSONE (DELTASONE) 10 MG tablet 6,5,4,3,2,1 take each day's dose at once in the morning with food 07/25/23   Georgian Co M, PA-C  rosuvastatin (CRESTOR) 20 MG tablet Take 1 tablet (20 mg total) by mouth daily. 04/24/23   Jake Bathe, MD      Allergies    Patient has no known allergies.    Review of Systems   Review of Systems  Respiratory:  Positive for shortness of breath.   All other systems reviewed and are negative.   Physical Exam Updated Vital Signs BP 114/87   Pulse 87   Temp (!) 97.5 F (36.4 C) (Oral)   Resp 20   Ht 6' (1.829 m)   Wt 95 kg   SpO2 98%   BMI 28.40 kg/m  Physical Exam Vitals and nursing note reviewed.  Constitutional:      General: He is not in acute distress.    Appearance: He is well-developed.  HENT:     Head: Atraumatic.  Eyes:     Conjunctiva/sclera: Conjunctivae normal.  Cardiovascular:     Rate and Rhythm: Normal rate and regular rhythm.  Pulmonary:     Effort: Pulmonary effort is normal.     Breath sounds: Wheezing present. No rhonchi or rales.  Chest:  Chest wall: No tenderness.  Abdominal:     Comments: Protuberant abdomen nontender to palpation.  Musculoskeletal:     Cervical back: Neck supple.     Right lower leg: No edema.     Left lower leg: No edema.  Skin:    Findings: No rash.  Neurological:     Mental Status: He is alert.     ED Results / Procedures / Treatments   Labs (all labs ordered are listed, but only abnormal results are displayed) Labs Reviewed  BASIC METABOLIC PANEL - Abnormal; Notable for the following components:      Result Value   Glucose, Bld 102 (*)    Creatinine, Ser 1.37 (*)    GFR, Estimated 59 (*)    All other components within normal limits  SARS CORONAVIRUS 2 BY RT PCR  CBC  BRAIN NATRIURETIC PEPTIDE  D-DIMER, QUANTITATIVE  TROPONIN I  (HIGH SENSITIVITY)  TROPONIN I (HIGH SENSITIVITY)    EKG EKG Interpretation Date/Time:  Thursday August 16 2023 09:56:51 EDT Ventricular Rate:  88 PR Interval:  153 QRS Duration:  94 QT Interval:  360 QTC Calculation: 436 R Axis:   18  Text Interpretation: Sinus rhythm Borderline repolarization abnormality NSR Confirmed by Coralee Pesa [phone]) on 08/16/2023 10:20:18 AM  Radiology DG Chest 2 View  Result Date: 08/16/2023 CLINICAL DATA:  Shortness of breath.  Cough. EXAM: CHEST - 2 VIEW COMPARISON:  November 07, 2022. FINDINGS: The heart size and mediastinal contours are within normal limits. Both lungs are clear. The visualized skeletal structures are unremarkable. IMPRESSION: No active cardiopulmonary disease. Electronically Signed   By: Lupita Raider M.D.   On: 08/16/2023 10:48    Procedures Procedures    Medications Ordered in ED Medications  ipratropium-albuterol (DUONEB) 0.5-2.5 (3) MG/3ML nebulizer solution 3 mL (3 mLs Nebulization Given 08/16/23 1208)  dexamethasone (DECADRON) injection 10 mg (10 mg Intravenous Given 08/16/23 1208)    ED Course/ Medical Decision Making/ A&P                                 Medical Decision Making Amount and/or Complexity of Data Reviewed Labs: ordered. Radiology: ordered.  Risk Prescription drug management.   BP 117/79   Pulse 72   Temp (!) 97.5 F (36.4 C) (Oral)   Resp (!) 23   Ht 6' (1.829 m)   Wt 95 kg   SpO2 98%   BMI 28.40 kg/m   97:1 AM 62 year old male significant history of mild cognitive impairment, chronic kidney disease, thyroid disease, GERD, brought here via EMS from home for evaluation of shortness of breath.  Patient with history of chronic wheezing, does use a rescue inhaler maybe once a day but for the past 3 days he is having increased shortness of breath, noticed increasing wheezing and is using his inhaler 2-3 times a day.  It does not provide adequate relief.  He also endorsed having productive  cough with green sputum.  He does endorse a mild body aches but no fever or chills no significant chest pain sore throat abdominal pain no urinary symptoms. No Prior history of PE or DVT.  No history of congestive heart failure.  On exam, patient is not in any acute respiratory discomfort.  He does have some minimal respiratory wheezes and slight decrease in breath sounds but otherwise unremarkable exam.  He does not appear to be fluid overload.  Vital signs overall reassuring  mild tachypnea.  -Labs ordered, independently viewed and interpreted by me.  Labs remarkable for negative d-dimer, doubt PE.  Covid negative.  Cr 1.37 similar to prior. Normal BNP, doubt CHF exacerbation. Negative delta trop, HEART score of 3, low risk of MACE.  Normal WBC, normal H&H no anemia.   -The patient was maintained on a cardiac monitor.  I personally viewed and interpreted the cardiac monitored which showed an underlying rhythm of: NSR -Imaging independently viewed and interpreted by me and I agree with radiologist's interpretation.  Result remarkable for CXR without acute finding -This patient presents to the ED for concern of sob, this involves an extensive number of treatment options, and is a complaint that carries with it a high risk of complications and morbidity.  The differential diagnosis includes copd exacerbation, CHF, pna, anemia, ptx, mi, viral illness -Co morbidities that complicate the patient evaluation includes copd, ckd -Treatment includes duonebs, decadron -Reevaluation of the patient after these medicines showed that the patient improved -PCP office notes or outside notes reviewed -Escalation to admission/observation considered: patients feels much better, is comfortable with discharge, and will follow up with PCP -Prescription medication considered, patient comfortable with medrol dosepak, z-pak, tessalon -Social Determinant of Health considered which includes depression  Patient able to ambulate  will maintain adequate oxygenation above 95%.  Patient stable for discharge with outpatient follow-up.  Return precaution given.         Final Clinical Impression(s) / ED Diagnoses Final diagnoses:  COPD exacerbation (HCC)    Rx / DC Orders ED Discharge Orders          Ordered    predniSONE (DELTASONE) 20 MG tablet        08/16/23 1335    benzonatate (TESSALON) 100 MG capsule  Every 8 hours        08/16/23 1335    azithromycin (ZITHROMAX Z-PAK) 250 MG tablet        08/16/23 1335              Fayrene Helper, PA-C 08/16/23 1335    Laurence Spates, MD 08/17/23 0700

## 2023-08-16 NOTE — ED Notes (Signed)
Pt ambulated well, pt had some sob ambulating, oxygen stayed between 95-99%

## 2023-09-10 ENCOUNTER — Emergency Department (HOSPITAL_COMMUNITY)
Admission: EM | Admit: 2023-09-10 | Discharge: 2023-09-10 | Disposition: A | Discharge status: Home / Self Care | Payer: Medicaid Other | Attending: Emergency Medicine | Admitting: Emergency Medicine

## 2023-09-10 ENCOUNTER — Emergency Department (HOSPITAL_COMMUNITY): Payer: Medicaid Other

## 2023-09-10 ENCOUNTER — Encounter (HOSPITAL_COMMUNITY): Payer: Self-pay

## 2023-09-10 ENCOUNTER — Other Ambulatory Visit: Payer: Self-pay

## 2023-09-10 ENCOUNTER — Ambulatory Visit: Payer: Self-pay | Admitting: *Deleted

## 2023-09-10 DIAGNOSIS — J9801 Acute bronchospasm: Secondary | ICD-10-CM | POA: Diagnosis not present

## 2023-09-10 DIAGNOSIS — J449 Chronic obstructive pulmonary disease, unspecified: Secondary | ICD-10-CM | POA: Diagnosis not present

## 2023-09-10 DIAGNOSIS — R0602 Shortness of breath: Secondary | ICD-10-CM | POA: Diagnosis present

## 2023-09-10 MED ORDER — RACEPINEPHRINE HCL 2.25 % IN NEBU
0.5000 mL | INHALATION_SOLUTION | Freq: Once | RESPIRATORY_TRACT | Status: AC
  Administered 2023-09-10: 0.5 mL via RESPIRATORY_TRACT
  Filled 2023-09-10: qty 0.5

## 2023-09-10 MED ORDER — PREDNISONE 20 MG PO TABS
60.0000 mg | ORAL_TABLET | Freq: Once | ORAL | Status: AC
  Administered 2023-09-10: 60 mg via ORAL
  Filled 2023-09-10: qty 3

## 2023-09-10 MED ORDER — PREDNISONE 10 MG PO TABS: 20.0000 mg | ORAL_TABLET | Freq: Every day | ORAL | 0 refills | Status: DC

## 2023-09-10 NOTE — Discharge Instructions (Signed)
Follow-up with [facility] in the next 2 to 3 weeks.  Call and get an appointment.  Make sure you tell them that you are in the emergency department and we felt like you need to be checked out there

## 2023-09-10 NOTE — ED Provider Notes (Signed)
[place] EMERGENCY DEPARTMENT AT [facility] Provider Note   CSN: [id number] Arrival date & time: 09/10/23  8295     History {Add pertinent medical, surgical, social history, OB history to HPI:1} Chief Complaint  Patient presents with   Shortness of Breath    Samuel Luna is a 62 y.o. male.  Patient complains of wheezing.  Patient has an albuterol inhaler.  Patient has a history of COPD   Shortness of Breath      Home Medications Prior to Admission medications   Medication Sig Start Date End Date Taking? Authorizing Provider  predniSONE (DELTASONE) 10 MG tablet Take 2 tablets (20 mg total) by mouth daily. 09/10/23  Yes Bethann Berkshire, MD  albuterol (VENTOLIN HFA) 108 (90 Base) MCG/ACT inhaler TAKE 2 PUFFS BY MOUTH EVERY 6 HOURS AS NEEDED FOR WHEEZE OR SHORTNESS OF BREATH 06/20/23   Marcine Matar, MD  azithromycin (ZITHROMAX Z-PAK) 250 MG tablet 2 po day one, then 1 daily x 4 days 08/16/23   Fayrene Helper, PA-C  benzonatate (TESSALON) 100 MG capsule Take 1 capsule (100 mg total) by mouth every 8 (eight) hours. 08/16/23   Fayrene Helper, PA-C  cyclobenzaprine (FLEXERIL) 10 MG tablet Take 1 tablet (10 mg total) by mouth 2 (two) times daily as needed for muscle spasms. 08/06/23   Netta Corrigan, PA-C  diclofenac Sodium (VOLTAREN) 1 % GEL APPLY 2 G TOPICALLY 2 (TWO) TIMES DAILY AS NEEDED. 12/22/22   Marcine Matar, MD  furosemide (LASIX) 20 MG tablet Take 0.5 tablets (10 mg total) by mouth daily as needed. 05/03/23   Marcine Matar, MD  HYDROcodone-acetaminophen (NORCO/VICODIN) 5-325 MG tablet Take 1 tablet by mouth every 6 (six) hours as needed. 05/28/23   Wynetta Fines, MD  levothyroxine (SYNTHROID) 100 MCG tablet TAKE 1 TABLET (100 MCG TOTAL) BY MOUTH DAILY. DOSE DECREASED 06/20/23   Marcine Matar, MD  lidocaine (LIDODERM) 5 % Place 1 patch onto the skin daily. Remove & Discard patch within 12 hours or as directed by MD 08/06/23   Netta Corrigan, PA-C   rosuvastatin (CRESTOR) 20 MG tablet Take 1 tablet (20 mg total) by mouth daily. 04/24/23   Jake Bathe, MD      Allergies    Patient has no known allergies.    Review of Systems   Review of Systems  Respiratory:  Positive for shortness of breath.     Physical Exam Updated Vital Signs BP 130/81   Pulse (!) 59   Temp 97.6 F (36.4 C)   Resp 18   Ht 6' (1.829 m)   Wt 95 kg   SpO2 97%   BMI 28.40 kg/m  Physical Exam  ED Results / Procedures / Treatments   Labs (all labs ordered are listed, but only abnormal results are displayed) Labs Reviewed - No data to display  EKG None  Radiology DG Chest 2 View  Result Date: 09/10/2023 CLINICAL DATA:  Shortness of breath. EXAM: CHEST - 2 VIEW COMPARISON:  08/16/2023. FINDINGS: Bilateral lung fields are clear. Bilateral costophrenic angles are clear. Normal cardio-mediastinal silhouette. No acute osseous abnormalities. The soft tissues are within normal limits. IMPRESSION: No active cardiopulmonary disease. Electronically Signed   By: Jules Schick M.D.   On: 09/10/2023 14:05    Procedures Procedures  {Document cardiac monitor, telemetry assessment procedure when appropriate:1}  Medications Ordered in ED Medications  predniSONE (DELTASONE) tablet 60 mg (60 mg Oral Given 09/10/23 1318)  Racepinephrine HCl 2.25 %  nebulizer solution 0.5 mL (0.5 mLs Nebulization Given 09/10/23 1316)    ED Course/ Medical Decision Making/ A&P   {Patient's breathing seems to be more upper airway.  He improved some with albuterol inhaler and prednisone.  He will be sent home with prednisone and referred to [facility] Click here for ABCD2, HEART and other calculatorsREFRESH Note before signing :1}                              Medical Decision Making Amount and/or Complexity of Data Reviewed Radiology: ordered.  Risk OTC drugs. Prescription drug management.   Bronchospasm.  Patient started on steroids and referred to [facility]  {Document  critical care time when appropriate:1} {Document review of labs and clinical decision tools ie heart score, Chads2Vasc2 etc:1}  {Document your independent review of radiology images, and any outside records:1} {Document your discussion with family members, caretakers, and with consultants:1} {Document social determinants of health affecting pt's care:1} {Document your decision making why or why not admission, treatments were needed:1} Final Clinical Impression(s) / ED Diagnoses Final diagnoses:  Bronchospasm    Rx / DC Orders ED Discharge Orders          Ordered    predniSONE (DELTASONE) 10 MG tablet  Daily        09/10/23 1439

## 2023-09-10 NOTE — ED Triage Notes (Signed)
Patient arrived from home. Complaining of shortness of breath increasing over past month. Has been here for the same last month, states he felt better after a few treatments, no asthma hx.  100% room air in triage.

## 2023-09-10 NOTE — Telephone Encounter (Signed)
  Chief Complaint: SOB, wheezing Symptoms: SOB at all times, cannot lay flat, wheezing, audible during call. Cough, greenish phlegm. Frequency: Saturday Pertinent Negatives: Patient denies  Disposition: [x] ED /[] Urgent Care (no appt availability in office) / [] Appointment(In office/virtual)/ []  [facility]/ [] Home Care/ [] Refused Recommended Disposition /[] [place] [facility]/ []  Follow-up with PCP Additional Notes: Wife calling, pt present. Pt seen in ED 08/16/23 for similar symptoms.  Audible wheezing, advised ED. States will follow disposition. Reason for Disposition  Wheezing can be heard across the room  Answer Assessment - Initial Assessment Questions 1. RESPIRATORY STATUS: "Describe your breathing?" (e.g., wheezing, shortness of breath, unable to speak, severe coughing)      SOB at all times, worse laying down, flat 2. ONSET: "When did this breathing problem begin?"      ED 08/16/23. Reoccurred Saturday 3. PATTERN "Does the difficult breathing come and go, or has it been constant since it started?"      Constant 4. SEVERITY: "How bad is your breathing?" (e.g., mild, moderate, severe)    - MILD: No SOB at rest, mild SOB with walking, speaks normally in sentences, can lie down, no retractions, pulse < 100.    - MODERATE: SOB at rest, SOB with minimal exertion and prefers to sit, cannot lie down flat, speaks in phrases, mild retractions, audible wheezing, pulse 100-120.    - SEVERE: Very SOB at rest, speaks in single words, struggling to breathe, sitting hunched forward, retractions, pulse > 120      Moderate 5. RECURRENT SYMPTOM: "Have you had difficulty breathing before?" If Yes, ask: "When was the last time?" and "What happened that time?"      Yes. 6. CARDIAC HISTORY: "Do you have any history of heart disease?" (e.g., heart attack, angina, bypass surgery, angioplasty)       7. LUNG HISTORY: "Do you have any history of lung disease?"  (e.g., pulmonary embolus, asthma,  emphysema)     YEs 8. CAUSE: "What do you think is causing the breathing problem?"      COPD 9. OTHER SYMPTOMS: "Do you have any other symptoms? (e.g., dizziness, runny nose, cough, chest pain, fever)     Wheezing,cough,greenish,  mild chest tightness,  10. O2 SATURATION MONITOR:  "Do you use an oxygen saturation monitor (pulse oximeter) at home?" If Yes, ask: "What is your reading (oxygen level) today?" "What is your usual oxygen saturation reading?" (e.g., 95%)       No  Protocols used: Breathing Difficulty-A-AH

## 2023-09-10 NOTE — Telephone Encounter (Signed)
Noted. Patient currently in [facility] ED

## 2023-09-12 ENCOUNTER — Ambulatory Visit: Payer: Self-pay | Admitting: *Deleted

## 2023-09-12 ENCOUNTER — Other Ambulatory Visit: Payer: Self-pay | Admitting: Internal Medicine

## 2023-09-12 DIAGNOSIS — E039 Hypothyroidism, unspecified: Secondary | ICD-10-CM

## 2023-09-12 DIAGNOSIS — G8929 Other chronic pain: Secondary | ICD-10-CM

## 2023-09-12 NOTE — Telephone Encounter (Signed)
  Chief Complaint: wheezing with exertion Symptoms: wheezing with exertion  and laying down. Inhaler not effective. Went to ED 09/10/23 . Has not started prednisone prescribed due to still waiting on insurance to approve. Frequency: last night  Pertinent Negatives: Patient denies chest pain no difficulty breathing no fever Disposition: [] ED /[] Urgent Care (no appt availability in office) / [x] Appointment(In office/virtual)/ []  [facility]/ [] Home Care/ [] Refused Recommended Disposition /[] [facility]/ []  Follow-up with PCP Additional Notes:   Appt scheduled for 09/13/23. Recommended to start prednisone asap. If sx continue or worsen go back to ED.      Reason for Disposition  [1] Longstanding difficulty breathing (e.g., CHF, COPD, emphysema) AND [2] WORSE than normal  Answer Assessment - Initial Assessment Questions 1. RESPIRATORY STATUS: "Describe your breathing?" (e.g., wheezing, shortness of breath, unable to speak, severe coughing)      Wheezing with laying down and mobility  2. ONSET: "When did this breathing problem begin?"      Last night  3. PATTERN "Does the difficult breathing come and go, or has it been constant since it started?"      Comes and goes with mobility  4. SEVERITY: "How bad is your breathing?" (e.g., mild, moderate, severe)    - MILD: No SOB at rest, mild SOB with walking, speaks normally in sentences, can lie down, no retractions, pulse < 100.    - MODERATE: SOB at rest, SOB with minimal exertion and prefers to sit, cannot lie down flat, speaks in phrases, mild retractions, audible wheezing, pulse 100-120.    - SEVERE: Very SOB at rest, speaks in single words, struggling to breathe, sitting hunched forward, retractions, pulse > 120      Wheezing with exertion 5. RECURRENT SYMPTOM: "Have you had difficulty breathing before?" If Yes, ask: "When was the last time?" and "What happened that time?"      Yes last seen in ED 09/10/23 6. CARDIAC  HISTORY: "Do you have any history of heart disease?" (e.g., heart attack, angina, bypass surgery, angioplasty)      na 7. LUNG HISTORY: "Do you have any history of lung disease?"  (e.g., pulmonary embolus, asthma, emphysema)     HX CPOD 8. CAUSE: "What do you think is causing the breathing problem?"      Has not started prednisone due to insurance approval 9. OTHER SYMPTOMS: "Do you have any other symptoms? (e.g., dizziness, runny nose, cough, chest pain, fever)     Wheezing , cough with exertion or mobility . 10. O2 SATURATION MONITOR:  "Do you use an oxygen saturation monitor (pulse oximeter) at home?" If Yes, ask: "What is your reading (oxygen level) today?" "What is your usual oxygen saturation reading?" (e.g., 95%)       na 11. PREGNANCY: "Is there any chance you are pregnant?" "When was your last menstrual period?"       na 12. TRAVEL: "Have you traveled out of the country in the last month?" (e.g., travel history, exposures)       na  Protocols used: Breathing Difficulty-A-AH

## 2023-09-12 NOTE — Telephone Encounter (Signed)
Noted  

## 2023-09-13 ENCOUNTER — Encounter: Payer: Self-pay | Admitting: Physician Assistant

## 2023-09-13 ENCOUNTER — Ambulatory Visit: Payer: Medicaid Other | Attending: Physician Assistant | Admitting: Physician Assistant

## 2023-09-13 VITALS — BP 135/76 | HR 68 | Wt 225.8 lb

## 2023-09-13 DIAGNOSIS — E039 Hypothyroidism, unspecified: Secondary | ICD-10-CM

## 2023-09-13 DIAGNOSIS — R635 Abnormal weight gain: Secondary | ICD-10-CM

## 2023-09-13 DIAGNOSIS — J9801 Acute bronchospasm: Secondary | ICD-10-CM | POA: Diagnosis not present

## 2023-09-13 DIAGNOSIS — Z09 Encounter for follow-up examination after completed treatment for conditions other than malignant neoplasm: Secondary | ICD-10-CM

## 2023-09-13 DIAGNOSIS — K219 Gastro-esophageal reflux disease without esophagitis: Secondary | ICD-10-CM | POA: Diagnosis not present

## 2023-09-13 MED ORDER — ADVAIR DISKUS 250-50 MCG/ACT IN AEPB
1.0000 | INHALATION_SPRAY | Freq: Two times a day (BID) | RESPIRATORY_TRACT | 3 refills | Status: DC
Start: 2023-09-13 — End: 2023-12-25

## 2023-09-13 MED ORDER — PANTOPRAZOLE SODIUM 40 MG PO TBEC
40.0000 mg | DELAYED_RELEASE_TABLET | Freq: Every day | ORAL | 3 refills | Status: DC
Start: 2023-09-13 — End: 2023-12-25

## 2023-09-13 NOTE — Progress Notes (Signed)
Patient ID: Samuel Luna, male   DOB: 1961/10/23, 62 y.o.   MRN: [id number]   Samuel Luna, is a 62 y.o. male  [id number]  [id number]  DOB - 1961/03/22  Chief Complaint  Patient presents with   Shortness of Breath   Wheezing       Subjective:   Samuel Luna is a 62 y.o. male here today for a follow up visit after being seen at the ED a few times for wheezing.  Most recent visit on 10/7.  He was given oral prednisone which he is still taking.  He also has an albuterol inhaler he is having to use 5 times daily.  His wife is with him.  Prior to about 1 month ago, he had not experienced wheezing or having to be on an inhaler.  He does drink about 2 beers a day and has gained about 15 pounds since June 2024.  He is compliant with thyroid meds.  He quit smoking about 20 years ago.  2 CXR in the last 2 months without abnormalities.  No fever or hemoptysis.  He does have a h/o acid reflux and not currently taking anything  No problems updated.  ALLERGIES: No Known Allergies  PAST MEDICAL HISTORY: Past Medical History:  Diagnosis Date   Arthritis    knees   Brain injury (HCC)    Chronic kidney disease    mild   GERD (gastroesophageal reflux disease)    Scoliosis    Thyroid disease    hypothyroidism    MEDICATIONS AT HOME: Prior to Admission medications   Medication Sig Start Date End Date Taking? Authorizing Provider  albuterol (VENTOLIN HFA) 108 (90 Base) MCG/ACT inhaler TAKE 2 PUFFS BY MOUTH EVERY 6 HOURS AS NEEDED FOR WHEEZE OR SHORTNESS OF BREATH 06/20/23  Yes Marcine Matar, MD  azithromycin (ZITHROMAX Z-PAK) 250 MG tablet 2 po day one, then 1 daily x 4 days 08/16/23  Yes Fayrene Helper, PA-C  benzonatate (TESSALON) 100 MG capsule Take 1 capsule (100 mg total) by mouth every 8 (eight) hours. 08/16/23  Yes Fayrene Helper, PA-C  cyclobenzaprine (FLEXERIL) 10 MG tablet Take 1 tablet (10 mg total) by mouth 2 (two) times daily as  needed for muscle spasms. 08/06/23  Yes Schuman, Fayrene Fearing T, PA-C  diclofenac Sodium (VOLTAREN) 1 % GEL APPLY 2 G TOPICALLY 2 (TWO) TIMES DAILY AS NEEDED. 12/22/22  Yes Marcine Matar, MD  fluticasone-salmeterol (ADVAIR DISKUS) 250-50 MCG/ACT AEPB Inhale 1 puff into the lungs in the morning and at bedtime. 09/13/23  Yes Georgian Co M, PA-C  furosemide (LASIX) 20 MG tablet Take 0.5 tablets (10 mg total) by mouth daily as needed. 05/03/23  Yes Marcine Matar, MD  HYDROcodone-acetaminophen (NORCO/VICODIN) 5-325 MG tablet Take 1 tablet by mouth every 6 (six) hours as needed. 05/28/23  Yes Wynetta Fines, MD  levothyroxine (SYNTHROID) 100 MCG tablet TAKE 1 TABLET (100 MCG TOTAL) BY MOUTH DAILY. DOSE DECREASED 06/20/23  Yes Marcine Matar, MD  lidocaine (LIDODERM) 5 % Place 1 patch onto the skin daily. Remove & Discard patch within 12 hours or as directed by MD 08/06/23  Yes Schuman, Beverly Gust, PA-C  pantoprazole (PROTONIX) 40 MG tablet Take 1 tablet (40 mg total) by mouth daily. 09/13/23  Yes Malaina Mortellaro M, PA-C  predniSONE (DELTASONE) 10 MG tablet Take 2 tablets (20 mg total) by mouth daily. 09/10/23  Yes Bethann Berkshire, MD  rosuvastatin (CRESTOR) 20 MG tablet Take 1 tablet (20 mg total) by mouth  daily. 04/24/23  Yes Jake Bathe, MD    ROS: Neg HEENT Neg resp Neg cardiac Neg GI Neg GU Neg MS Neg psych Neg neuro  Objective:   Vitals:   09/13/23 0930  BP: 135/76  Pulse: 68  SpO2: 96%  Weight: 225 lb 12.8 oz (102.4 kg)   Exam General appearance : Awake, alert, not in any distress. Speech Clear. Not toxic looking HEENT: Atraumatic and Normocephalic, missing teeth, throat with PND Neck: Supple, no JVD. No cervical lymphadenopathy.  Chest: fair air entry bilaterally.  No rales/rhonchi.  There is diffuse and moderate wheezing throughout.   CVS: S1 S2 regular, no murmurs.  Extremities: B/L Lower Ext shows no edema, both legs are warm to touch Neurology: Awake alert, and oriented X  3, CN II-XII intact, Non focal Skin: No Rash  Data Review No results found for: "HGBA1C"  Assessment & Plan   1. Bronchospasm ? Etiology ? Multifactoral(hx smoking/acid reflux/alcohol use/allergic) Finish prednisone Rx - fluticasone-salmeterol (ADVAIR DISKUS) 250-50 MCG/ACT AEPB; Inhale 1 puff into the lungs in the morning and at bedtime.  Dispense: 60 each; Refill: 3 - Ambulatory referral to Pulmonology  2. Gastroesophageal reflux disease without esophagitis - pantoprazole (PROTONIX) 40 MG tablet; Take 1 tablet (40 mg total) by mouth daily.  Dispense: 30 tablet; Refill: 3  3. Hypothyroidism, unspecified type Will send Rx after labs are back - Thyroid Panel With TSH  4. Weight gain Makes acid reflux more likely - Thyroid Panel With TSH  5. Encounter for examination following treatment at hospital    Return in about 4 months (around 01/14/2024) for PCP for chronic conditions-johnson.  The patient was given clear instructions to go to ER or return to medical center if symptoms don't improve, worsen or new problems develop. The patient verbalized understanding. The patient was told to call to get lab results if they haven't heard anything in the next week.      Georgian Co, PA-C [facility] [place], [place] [phone]   09/13/2023, 9:59 AM

## 2023-09-14 LAB — THYROID PANEL WITH TSH
Free Thyroxine Index: 2.6 (ref 1.2–4.9)
T3 Uptake Ratio: 29 % (ref 24–39)
T4, Total: 8.9 ug/dL (ref 4.5–12.0)
TSH: 9.41 u[IU]/mL — ABNORMAL HIGH (ref 0.450–4.500)

## 2023-09-17 ENCOUNTER — Telehealth: Payer: Self-pay

## 2023-09-17 ENCOUNTER — Other Ambulatory Visit: Payer: Self-pay | Admitting: Physician Assistant

## 2023-09-17 MED ORDER — LEVOTHYROXINE SODIUM 112 MCG PO TABS: 112.0000 ug | ORAL_TABLET | Freq: Every day | ORAL | 3 refills | Status: DC

## 2023-09-17 NOTE — Telephone Encounter (Signed)
Pt wife was called who is on DPR  and is aware of results, DOB was confirmed.

## 2023-09-17 NOTE — Telephone Encounter (Signed)
-----   Message from Georgian Co sent at 09/17/2023  7:20 AM EDT ----- Please call patient.  Stop levothyroxine 100 micrograms and pick up new prescription of synthroid 112 micrograms to take daily bc his current dose is a little too low to support thyroid hormone.  We will recheck in about 3 months.  Thanks, [employer], PA-C

## 2023-11-02 ENCOUNTER — Other Ambulatory Visit: Payer: Self-pay | Admitting: Internal Medicine

## 2023-11-02 DIAGNOSIS — E039 Hypothyroidism, unspecified: Secondary | ICD-10-CM

## 2023-11-02 DIAGNOSIS — G8929 Other chronic pain: Secondary | ICD-10-CM

## 2023-12-25 ENCOUNTER — Other Ambulatory Visit: Payer: Self-pay | Admitting: Internal Medicine

## 2023-12-25 ENCOUNTER — Other Ambulatory Visit: Payer: Self-pay | Admitting: Physician Assistant

## 2023-12-25 DIAGNOSIS — K219 Gastro-esophageal reflux disease without esophagitis: Secondary | ICD-10-CM

## 2023-12-25 DIAGNOSIS — E039 Hypothyroidism, unspecified: Secondary | ICD-10-CM

## 2023-12-25 DIAGNOSIS — J9801 Acute bronchospasm: Secondary | ICD-10-CM

## 2023-12-25 NOTE — Telephone Encounter (Signed)
No longer current dosing of this medication Requested Prescriptions  Pending Prescriptions Disp Refills   levothyroxine (SYNTHROID) 100 MCG tablet [Pharmacy Med Name: LEVOTHYROXINE 100 MCG TABLET] 90 tablet 0    Sig: TAKE 1 TABLET (100 MCG TOTAL) BY MOUTH DAILY. DOSE DECREASED     Endocrinology:  Hypothyroid Agents Failed - 12/25/2023  4:27 PM      Failed - TSH in normal range and within 360 days    TSH  Date Value Ref Range Status  09/13/2023 9.410 (H) 0.450 - 4.500 uIU/mL Final         Passed - Valid encounter within last 12 months    Recent Outpatient Visits           3 months ago Bronchospasm   [facility] [facility] [place]. [facility] [place], [place], [place]   5 months ago Wheezing   [place] [facility] [place] [facility] [place]. [facility] [place], [place], [place]   7 months ago Hypothyroidism, unspecified type   [place] [place] [facility] [place] [facility] [place]. [facility] Marcine Matar, MD   11 months ago Hypothyroidism, unspecified type    [facility] [place]. [facility] Marcine Matar, MD   1 year ago Establishing care with new doctor, encounter for   [facility] - [facility] Of [place]. [facility] Marcine Matar, MD

## 2023-12-25 NOTE — Telephone Encounter (Signed)
Requested Prescriptions  Pending Prescriptions Disp Refills   ADVAIR DISKUS 250-50 MCG/ACT AEPB [Pharmacy Med Name: ADVAIR 250-50 DISKUS] 60 each 3    Sig: INHALE 1 PUFF INTO THE LUNGS IN THE MORNING AND AT BEDTIME.     Pulmonology:  Combination Products Passed - 12/25/2023  4:30 PM      Passed - Valid encounter within last 12 months    Recent Outpatient Visits           3 months ago Bronchospasm   [place] [facility] [place]. [facility] [place], [place], [place]   5 months ago Wheezing   [place] [facility] [place] [facility] [place]. [facility] [place], [place], [place]   7 months ago Hypothyroidism, unspecified type   [place] [facility] [place] [facility] [place]. [facility] Marcine Matar, MD   11 months ago Hypothyroidism, unspecified type   [place] [facility] - [facility] Dept Of [place]. [facility] Marcine Matar, MD   1 year ago Establishing care with new doctor, encounter for   [facility] - [facility] [place]. [facility] Jonah Blue B, MD               pantoprazole (PROTONIX) 40 MG tablet [Pharmacy Med Name: PANTOPRAZOLE SOD DR 40 MG TAB] 30 tablet 3    Sig: TAKE 1 TABLET BY MOUTH EVERY DAY     Gastroenterology: Proton Pump Inhibitors Passed - 12/25/2023  4:30 PM      Passed - Valid encounter within last 12 months    Recent Outpatient Visits           3 months ago Bronchospasm   [place] [facility] [place] [facility] [place]. [facility] [place], [place], [place]   5 months ago Wheezing   [place] [facility] [place] [facility] [place]. [facility] [place], [place], [place]   7 months ago Hypothyroidism, unspecified type   [place] [facility] [place] [facility] [place]. [facility] Marcine Matar, MD   11 months ago Hypothyroidism, unspecified type   [place] [facility] [place] - [facility] Dept Of  [place]. [facility] Marcine Matar, MD   1 year ago Establishing care with new doctor, encounter for   [facility] - A Dept Of . [facility] Marcine Matar, MD

## 2024-02-25 ENCOUNTER — Ambulatory Visit: Payer: Self-pay

## 2024-02-25 NOTE — Telephone Encounter (Signed)
Noted appointment scheduled.

## 2024-02-25 NOTE — Telephone Encounter (Signed)
  Chief Complaint: lump in neck Symptoms: lump Frequency: ongoing Pertinent Negatives: Patient denies fever, difficulty swallowing, difficulty breathing.  Disposition: [] ED /[] Urgent Care (no appt availability in office) / [x] Appointment(In office/virtual)/ []  [place] [facility]/ [] Home Care/ [] Refused Recommended Disposition /[] [place] [facility]/ []  Follow-up with PCP Additional Notes:  Noticed swelling in neck just to side and below thyroid. Swelling has been present for "some time" but wife just noted this swelling today, he is "not forthcoming when things come up". Denies all other symptoms. Acute visit scheduled with another provider due to no acute visits available with PCP. Care advice discussed as documented in protocol. Discussed reasons to call back.     Copied from CRM [id number]. Topic: Clinical - Red Word Triage >> Feb 25, 2024  1:47 PM Payton Doughty wrote: Red Word that prompted transfer to Nurse Triage: pt's thyroid is swelling.  Wife is calling for appt Reason for Disposition  [1] Large node AND [2] present > 2 weeks  Protocols used: Lymph Nodes - Swollen-A-AH

## 2024-02-28 ENCOUNTER — Ambulatory Visit: Attending: Physician Assistant | Admitting: Physician Assistant

## 2024-02-28 ENCOUNTER — Encounter: Payer: Self-pay | Admitting: Physician Assistant

## 2024-02-28 VITALS — BP 137/85 | HR 62 | Resp 20 | Ht 72.0 in | Wt 237.0 lb

## 2024-02-28 DIAGNOSIS — R0609 Other forms of dyspnea: Secondary | ICD-10-CM | POA: Diagnosis not present

## 2024-02-28 DIAGNOSIS — J9801 Acute bronchospasm: Secondary | ICD-10-CM

## 2024-02-28 DIAGNOSIS — E039 Hypothyroidism, unspecified: Secondary | ICD-10-CM

## 2024-02-28 DIAGNOSIS — E785 Hyperlipidemia, unspecified: Secondary | ICD-10-CM

## 2024-02-28 DIAGNOSIS — R591 Generalized enlarged lymph nodes: Secondary | ICD-10-CM | POA: Diagnosis not present

## 2024-02-28 MED ORDER — ALBUTEROL SULFATE HFA 108 (90 BASE) MCG/ACT IN AERS: 1.0000 | INHALATION_SPRAY | RESPIRATORY_TRACT | 2 refills | Status: DC | PRN

## 2024-02-28 MED ORDER — ROSUVASTATIN CALCIUM 20 MG PO TABS: 20.0000 mg | ORAL_TABLET | Freq: Every day | ORAL | 3 refills | Status: DC

## 2024-02-28 MED ORDER — FLUTICASONE-SALMETEROL 250-50 MCG/ACT IN AEPB: 1.0000 | INHALATION_SPRAY | Freq: Two times a day (BID) | RESPIRATORY_TRACT | 3 refills | Status: DC

## 2024-02-28 MED ORDER — LEVOTHYROXINE SODIUM 112 MCG PO TABS: 112.0000 ug | ORAL_TABLET | Freq: Every day | ORAL | 3 refills | Status: DC

## 2024-02-28 NOTE — Progress Notes (Signed)
 Patient ID: Samuel Luna, male   DOB: 05/06/1961, 63 y.o.   MRN: [id number]   Samuel Luna, is a 63 y.o. male  [id number]  [id number]  DOB - [birth year]  Chief Complaint  Patient presents with   Mass    Left side of neck under jaw.       Subjective:   Samuel Luna is a 63 y.o. male here today for a tender lymph node on the L side of his neck that has been going up and down in size the last ~5 days.  No weight loss.  No change in appetite.  No difficulty swallowing.  No fever.  No ST.  No teeth.  Compliant with meds.  Some allergies.  No new piercings  No problems updated.  ALLERGIES: No Known Allergies  PAST MEDICAL HISTORY: Past Medical History:  Diagnosis Date   Arthritis    knees   Brain injury (HCC)    Chronic kidney disease    mild   GERD (gastroesophageal reflux disease)    Scoliosis    Thyroid disease    hypothyroidism    MEDICATIONS AT HOME: Prior to Admission medications   Medication Sig Start Date End Date Taking? Authorizing Provider  diclofenac Sodium (VOLTAREN) 1 % GEL APPLY 2 G TOPICALLY 2 (TWO) TIMES DAILY AS NEEDED. 12/22/22  Yes Marcine Matar, MD  albuterol (VENTOLIN HFA) 108 (90 Base) MCG/ACT inhaler Inhale 1-2 puffs into the lungs every 4 (four) hours as needed for wheezing or shortness of breath. 02/28/24   Anders Simmonds, PA-C  cyclobenzaprine (FLEXERIL) 10 MG tablet Take 1 tablet (10 mg total) by mouth 2 (two) times daily as needed for muscle spasms. Patient not taking: Reported on 02/28/2024 08/06/23   Netta Corrigan, PA-C  fluticasone-salmeterol (ADVAIR DISKUS) 250-50 MCG/ACT AEPB Inhale 1 puff into the lungs in the morning and at bedtime. 02/28/24   Anders Simmonds, PA-C  HYDROcodone-acetaminophen (NORCO/VICODIN) 5-325 MG tablet Take 1 tablet by mouth every 6 (six) hours as needed. Patient not taking: Reported on 02/28/2024 05/28/23   Wynetta Fines, MD  levothyroxine (SYNTHROID) 112 MCG tablet Take 1 tablet (112 mcg total) by  mouth daily. 02/28/24   Anders Simmonds, PA-C  lidocaine (LIDODERM) 5 % Place 1 patch onto the skin daily. Remove & Discard patch within 12 hours or as directed by MD Patient not taking: Reported on 02/28/2024 08/06/23   Netta Corrigan, PA-C  predniSONE (DELTASONE) 10 MG tablet Take 2 tablets (20 mg total) by mouth daily. Patient not taking: Reported on 02/28/2024 09/10/23   Bethann Berkshire, MD  rosuvastatin (CRESTOR) 20 MG tablet Take 1 tablet (20 mg total) by mouth daily. 02/28/24   Tracer Gutridge, Marzella Schlein, PA-C    ROS: Neg resp Neg cardiac Neg GI Neg GU Neg MS Neg psych Neg neuro  Objective:   Vitals:   02/28/24 1421  BP: 137/85  Pulse: 62  Resp: 20  SpO2: 100%  Weight: 237 lb (107.5 kg)  Height: 6' (1.829 m)   Exam General appearance : Awake, alert, not in any distress. Speech Clear. Not toxic looking HEENT: Atraumatic and Normocephalic, pupils equally reactive to light and accomodation, B TM WNL (R is a little congested), MMM.  No erythema of throat or uvula.  No gum swelling.   Neck: Supple, no JVD. No post cervical lymphadenopathy. No Tmegaly.  L AC node tender, swollen(~3cm) and soft and freely mobile.  No supraclavicular nodes.  No AC nodes on L Chest: Good  air entry bilaterally, CTAB.  No rales/rhonchi/wheezing CVS: S1 S2 regular, no murmurs.  Extremities: B/L Lower Ext shows no edema, both legs are warm to touch Neurology: Awake alert, and oriented X 3, CN II-XII intact, Non focal Skin: No Rash  Data Review No results found for: "HGBA1C"  Assessment & Plan   1. Lymphadenopathy (Primary) - CBC with Differential - US Soft Tissue Head/Neck (NON-THYROID); Future -likely reactive and benign-will assess with imaging  2. Bronchospasm - fluticasone-salmeterol (ADVAIR DISKUS) 250-50 MCG/ACT AEPB; Inhale 1 puff into the lungs in the morning and at bedtime.  Dispense: 60 each; Refill: 3  3. DOE (dyspnea on exertion) - albuterol (VENTOLIN HFA) 108 (90 Base) MCG/ACT inhaler;  Inhale 1-2 puffs into the lungs every 4 (four) hours as needed for wheezing or shortness of breath.  Dispense: 54 each; Refill: 2  4. Hypothyroidism, unspecified type - levothyroxine (SYNTHROID) 112 MCG tablet; Take 1 tablet (112 mcg total) by mouth daily.  Dispense: 90 tablet; Refill: 3 - TSH  5. Hyperlipidemia, unspecified hyperlipidemia type - rosuvastatin (CRESTOR) 20 MG tablet; Take 1 tablet (20 mg total) by mouth daily.  Dispense: 90 tablet; Refill: 3 Not fasting today   Return in about 3 months (around 05/30/2024) for PCP for chronic conditions.  The patient was given clear instructions to go to ER or return to medical center if symptoms don't improve, worsen or new problems develop. The patient verbalized understanding. The patient was told to call to get lab results if they haven't heard anything in the next week.      Georgian Co, PA-C [facility] [place], [place] [phone]   02/28/2024, 3:12 PM

## 2024-02-29 ENCOUNTER — Other Ambulatory Visit: Payer: Self-pay | Admitting: Physician Assistant

## 2024-02-29 ENCOUNTER — Ambulatory Visit
Admission: RE | Admit: 2024-02-29 | Discharge: 2024-02-29 | Disposition: A | Discharge status: Home / Self Care | Source: Ambulatory Visit | Attending: Physician Assistant | Admitting: Physician Assistant

## 2024-02-29 DIAGNOSIS — R591 Generalized enlarged lymph nodes: Secondary | ICD-10-CM

## 2024-02-29 DIAGNOSIS — E039 Hypothyroidism, unspecified: Secondary | ICD-10-CM

## 2024-02-29 LAB — CBC WITH DIFFERENTIAL/PLATELET
Basophils Absolute: 0.1 10*3/uL (ref 0.0–0.2)
Basos: 1 %
EOS (ABSOLUTE): 0.2 10*3/uL (ref 0.0–0.4)
Eos: 3 %
Hematocrit: 43.8 % (ref 37.5–51.0)
Hemoglobin: 14.3 g/dL (ref 13.0–17.7)
Immature Grans (Abs): 0 10*3/uL (ref 0.0–0.1)
Immature Granulocytes: 0 %
Lymphocytes Absolute: 1.7 10*3/uL (ref 0.7–3.1)
Lymphs: 23 %
MCH: 29.2 pg (ref 26.6–33.0)
MCHC: 32.6 g/dL (ref 31.5–35.7)
MCV: 90 fL (ref 79–97)
Monocytes Absolute: 0.6 10*3/uL (ref 0.1–0.9)
Monocytes: 9 %
Neutrophils Absolute: 4.6 10*3/uL (ref 1.4–7.0)
Neutrophils: 64 %
Platelets: 144 10*3/uL — ABNORMAL LOW (ref 150–450)
RBC: 4.89 x10E6/uL (ref 4.14–5.80)
RDW: 14.4 % (ref 11.6–15.4)
WBC: 7.2 10*3/uL (ref 3.4–10.8)

## 2024-02-29 LAB — TSH: TSH: 62.2 u[IU]/mL — ABNORMAL HIGH (ref 0.450–4.500)

## 2024-02-29 MED ORDER — LEVOTHYROXINE SODIUM 137 MCG PO TABS
137.0000 ug | ORAL_TABLET | Freq: Every day | ORAL | 2 refills | Status: DC
Start: 2024-02-29 — End: 2024-05-27

## 2024-03-19 ENCOUNTER — Other Ambulatory Visit: Payer: Self-pay | Admitting: Internal Medicine

## 2024-03-19 DIAGNOSIS — E039 Hypothyroidism, unspecified: Secondary | ICD-10-CM

## 2024-03-24 ENCOUNTER — Emergency Department (HOSPITAL_COMMUNITY)

## 2024-03-24 ENCOUNTER — Encounter (HOSPITAL_COMMUNITY): Payer: Self-pay | Admitting: Emergency Medicine

## 2024-03-24 ENCOUNTER — Other Ambulatory Visit: Payer: Self-pay

## 2024-03-24 ENCOUNTER — Emergency Department (HOSPITAL_COMMUNITY)
Admission: EM | Admit: 2024-03-24 | Discharge: 2024-03-24 | Disposition: A | Discharge status: Home / Self Care | Attending: Emergency Medicine | Admitting: Emergency Medicine

## 2024-03-24 DIAGNOSIS — X58XXXA Exposure to other specified factors, initial encounter: Secondary | ICD-10-CM | POA: Diagnosis not present

## 2024-03-24 DIAGNOSIS — S6992XA Unspecified injury of left wrist, hand and finger(s), initial encounter: Secondary | ICD-10-CM | POA: Insufficient documentation

## 2024-03-24 DIAGNOSIS — M79645 Pain in left finger(s): Secondary | ICD-10-CM

## 2024-03-24 MED ORDER — DEXAMETHASONE SODIUM PHOSPHATE 10 MG/ML IJ SOLN
10.0000 mg | Freq: Once | INTRAMUSCULAR | Status: AC
  Administered 2024-03-24: 10 mg via INTRAMUSCULAR
  Filled 2024-03-24: qty 1

## 2024-03-24 MED ORDER — DOXYCYCLINE HYCLATE 100 MG PO CAPS: 100.0000 mg | ORAL_CAPSULE | Freq: Two times a day (BID) | ORAL | 0 refills | Status: DC

## 2024-03-24 MED ORDER — COLCHICINE 0.6 MG PO TABS: 0.6000 mg | ORAL_TABLET | Freq: Two times a day (BID) | ORAL | 0 refills | Status: DC

## 2024-03-24 NOTE — Discharge Instructions (Signed)
 We are here for both infection and a potential diagnosis of gout.  Please take the medications I have ordered as prescribed. Get help right away if your pain is worsening or if you see redness or streaking in your hand running fevers or any new or concerning symptoms.  Otherwise please follow closely with your primary care physician or you for any further management.

## 2024-03-24 NOTE — ED Triage Notes (Signed)
 Pt reports left middle finger pain and swelling x 2 weeks. Pt reports he is unable to move the finger. Denies injury to the area.

## 2024-03-24 NOTE — ED Provider Notes (Signed)
 [place] EMERGENCY DEPARTMENT AT [facility] Provider Note   CSN: [id number] Arrival date & time: 03/24/24  4098     History {Add pertinent medical, surgical, social history, OB history to HPI:1} Chief Complaint  Patient presents with   Hand Pain    Samuel Luna is a 63 y.o. male who presents emergency department chief complaint of left middle finger pain.  Patient reports that last week he had a tender callus over his flexor surface on the palmar side of his PIP joint.  It came off but then his finger swelled up and now he is having pain in the entire finger specifically over the PIP joint of his left middle finger and the entire proximal phalanx.  No known injuries.  He is left-hand dominant.  He has a history of gout.   Hand Pain       Home Medications Prior to Admission medications   Medication Sig Start Date End Date Taking? Authorizing Provider  albuterol  (VENTOLIN  HFA) 108 (90 Base) MCG/ACT inhaler Inhale 1-2 puffs into the lungs every 4 (four) hours as needed for wheezing or shortness of breath. 02/28/24   Hassie Lint, PA-C  cyclobenzaprine  (FLEXERIL ) 10 MG tablet Take 1 tablet (10 mg total) by mouth 2 (two) times daily as needed for muscle spasms. Patient not taking: Reported on 02/28/2024 08/06/23   Aleatha Hunting T, PA-C  diclofenac  Sodium (VOLTAREN ) 1 % GEL APPLY 2 G TOPICALLY 2 (TWO) TIMES DAILY AS NEEDED. 12/22/22   Lawrance Presume, MD  fluticasone -salmeterol (ADVAIR  DISKUS) 250-50 MCG/ACT AEPB Inhale 1 puff into the lungs in the morning and at bedtime. 02/28/24   Hassie Lint, PA-C  HYDROcodone -acetaminophen  (NORCO/VICODIN) 5-325 MG tablet Take 1 tablet by mouth every 6 (six) hours as needed. Patient not taking: Reported on 02/28/2024 05/28/23   Burnette Carte, MD  levothyroxine  (SYNTHROID ) 137 MCG tablet Take 1 tablet (137 mcg total) by mouth daily before breakfast. 02/29/24   Hassie Lint, PA-C  lidocaine  (LIDODERM ) 5 % Place 1 patch onto  the skin daily. Remove & Discard patch within 12 hours or as directed by MD Patient not taking: Reported on 02/28/2024 08/06/23   Aleatha Hunting T, PA-C  predniSONE  (DELTASONE ) 10 MG tablet Take 2 tablets (20 mg total) by mouth daily. Patient not taking: Reported on 02/28/2024 09/10/23   Cheyenne Cotta, MD  rosuvastatin  (CRESTOR ) 20 MG tablet Take 1 tablet (20 mg total) by mouth daily. 02/28/24   Hassie Lint, PA-C      Allergies    Patient has no known allergies.    Review of Systems   Review of Systems  Physical Exam Updated Vital Signs BP (!) 146/85 (BP Location: Left Arm)   Pulse 72   Temp 97.6 F (36.4 C) (Oral)   Resp 16   SpO2 98%  Physical Exam Vitals and nursing note reviewed.  Constitutional:      General: He is not in acute distress.    Appearance: He is well-developed. He is not diaphoretic.  HENT:     Head: Normocephalic and atraumatic.  Eyes:     General: No scleral icterus.    Conjunctiva/sclera: Conjunctivae normal.  Cardiovascular:     Rate and Rhythm: Normal rate and regular rhythm.     Heart sounds: Normal heart sounds.  Pulmonary:     Effort: Pulmonary effort is normal. No respiratory distress.     Breath sounds: Normal breath sounds.  Abdominal:     Palpations: Abdomen  is soft.     Tenderness: There is no abdominal tenderness.  Musculoskeletal:     Cervical back: Normal range of motion and neck supple.     Comments: Effusion palpable in the proximal interphalangeal joint of the left middle finger.  He is also tender with swelling circumferentially around the proximal phalanx.  Patient's finger is resting in passive flexion.  He is able to move the joint a little bit but has significant pain with passive extension of the finger.  He is not significantly tender over the palmar surface of the flexor tendon no puncture wounds or other signs of trauma.  Skin:    General: Skin is warm and dry.  Neurological:     Mental Status: He is alert.  Psychiatric:         Behavior: Behavior normal.     ED Results / Procedures / Treatments   Labs (all labs ordered are listed, but only abnormal results are displayed) Labs Reviewed - No data to display  EKG None  Radiology DG Finger Middle Left Result Date: 03/24/2024 CLINICAL DATA:  Pain and swelling for 5 days. No history of trauma reported EXAM: LEFT MIDDLE FINGER 3V COMPARISON:  None Available. FINDINGS: There is no evidence of fracture or dislocation. There is no evidence of arthropathy or other focal bone abnormality. Soft tissues are unremarkable. IMPRESSION: No acute osseous abnormality. Electronically Signed   By: Adrianna Horde M.D.   On: 03/24/2024 11:08    Procedures Procedures  {Document cardiac monitor, telemetry assessment procedure when appropriate:1}  Medications Ordered in ED Medications  dexamethasone  (DECADRON ) injection 10 mg (has no administration in time range)    ED Course/ Medical Decision Making/ A&P   {   Click here for ABCD2, HEART and other calculatorsREFRESH Note before signing :1}                              Medical Decision Making Amount and/or Complexity of Data Reviewed Radiology: ordered.  Risk Prescription drug management.   Patient here with finger pain, differential diagnosis includes gout, less likely infectious tenosynovitis, tendinitis, trigger finger.  Doubt osteomyelitis.  Seen and shared visit with Dr. Scarlette Currier.  Suspect this is likely from gout but his symptoms are not particularly advancing of either diagnosis of infectious tenosynovitis or gout will cover for both with doxycycline  and steroids colchicine  and discharge.  Discussed return precautions.  {Document critical care time when appropriate:1} {Document review of labs and clinical decision tools ie heart score, Chads2Vasc2 etc:1}  {Document your independent review of radiology images, and any outside records:1} {Document your discussion with family members, caretakers, and with  consultants:1} {Document social determinants of health affecting pt's care:1} {Document your decision making why or why not admission, treatments were needed:1} Final Clinical Impression(s) / ED Diagnoses Final diagnoses:  None    Rx / DC Orders ED Discharge Orders     None

## 2024-04-11 ENCOUNTER — Other Ambulatory Visit: Payer: Self-pay | Admitting: Internal Medicine

## 2024-04-11 DIAGNOSIS — E039 Hypothyroidism, unspecified: Secondary | ICD-10-CM

## 2024-04-30 ENCOUNTER — Encounter: Payer: Self-pay | Admitting: Cardiology

## 2024-04-30 ENCOUNTER — Ambulatory Visit: Payer: Medicaid Other | Attending: Cardiology | Admitting: Cardiology

## 2024-04-30 VITALS — BP 131/73 | HR 74 | Ht 72.0 in | Wt 222.0 lb

## 2024-04-30 DIAGNOSIS — E7800 Pure hypercholesterolemia, unspecified: Secondary | ICD-10-CM | POA: Insufficient documentation

## 2024-04-30 DIAGNOSIS — R0609 Other forms of dyspnea: Secondary | ICD-10-CM | POA: Insufficient documentation

## 2024-04-30 NOTE — Progress Notes (Signed)
 Cardiology Office Note:  .   Date:  04/30/2024  ID:  Samuel Luna, DOB [birth year], MRN [id number] PCP: Lawrance Presume, MD  [facility] Providers Cardiologist:  None     History of Present Illness: .   Samuel Luna is a 63 y.o. male Discussed the use of AI scribe software for clinical note transcription with the patient, who gave verbal consent to proceed.  History of Present Illness Samuel Luna is a 63 year old male with coronary artery disease who presents for follow-up on coronary calcium  score. He is accompanied by an unnamed individual who appears to be a caregiver or family member.  His coronary calcium  score was previously noted to be 32, placing him in the 51st percentile with minimal plaque and 25% in the left main artery. Aortic atherosclerosis was also noted.  He experiences a 'little sharp pain' in his chest area, though it is not severe. The pain is located in the chest without specific radiation and is not constant.  His medication regimen was changed from atorvastatin  10 mg to Crestor  10 mg. He had an incident where he discarded his medications, leading to a delay in restarting them due to refill restrictions. He is now back on his medications.  He experiences shortness of breath when walking, but not when riding a bike. This symptom is bothersome during walking but not during other activities.  He has a history of smoking cigars or cigarettes but has not smoked in years. He uses inhalers, likely related to past smoking habits.  He lives with a large dog named Tumbleweed, which he describes as very active and strong, indicating a busy household environment.      Studies Reviewed: .        Results LABS LDL: 119  RADIOLOGY Coronary calcium  score: 32, 51st percentile, minimal plaque, 25% in left main artery CT scan of the heart: Minimal plaque, aortic atherosclerosis Risk Assessment/Calculations:            Physical Exam:   VS:  BP 131/73    Pulse 74   Ht 6' (1.829 m)   Wt 222 lb (100.7 kg)   SpO2 93%   BMI 30.11 kg/m    Wt Readings from Last 3 Encounters:  04/30/24 222 lb (100.7 kg)  02/28/24 237 lb (107.5 kg)  09/13/23 225 lb 12.8 oz (102.4 kg)    GEN: Well nourished, well developed in no acute distress NECK: No JVD; No carotid bruits CARDIAC: RRR, no murmurs, no rubs, no gallops RESPIRATORY:  Clear to auscultation without rales, wheezing or rhonchi  ABDOMEN: Soft, non-tender, non-distended EXTREMITIES:  No edema; No deformity   ASSESSMENT AND PLAN: .    Assessment and Plan Assessment & Plan Aortic atherosclerosis Aortic atherosclerosis identified on coronary calcium  scan with a score of 32, placing him in the 51st percentile. Minimal plaque present, with 25% in the left main artery, unlikely to cause significant symptoms.  Hyperlipidemia Hyperlipidemia managed with Crestor  (rosuvastatin ) 10 mg, effectively lowering LDL cholesterol from 119 mg/dL to a more manageable level. Continued management is crucial to prevent cardiovascular events such as myocardial infarction and cerebrovascular accidents. - Continue Crestor  (rosuvastatin ) 10 mg daily. - Recommend low-dose aspirin for cardiovascular prevention, provided there is no bleeding history.  Dyspnea on exertion Dyspnea on exertion reported, particularly during walking but not while biking. Cardiac function appears normal based on heart pump evaluation, suggesting a possible conditioning issue rather than a cardiac cause. - Encourage regular physical activity.  Precordial  chest pain Intermittent sharp precordial chest pain reported. Minimal plaque on coronary calcium  scan makes cardiac origin unlikely. Pain is likely musculoskeletal in nature.  Follow-up - Schedule follow-up appointment in one year.          Signed, Dorothye Gathers, MD

## 2024-04-30 NOTE — Patient Instructions (Signed)
 Medication Instructions:  The current medical regimen is effective;  continue present plan and medications.  *If you need a refill on your cardiac medications before your next appointment, please call your pharmacy*  Follow-Up: At [facility], you and your health needs are our priority.  As part of our continuing mission to provide you with exceptional heart care, our providers are all part of one team.  This team includes your primary Cardiologist (physician) and Advanced Practice Providers or APPs (Physician Assistants and Nurse Practitioners) who all work together to provide you with the care you need, when you need it.  Your next appointment:   1 year(s)  Provider:   Dr Dorothye Gathers     We recommend signing up for the patient portal called "MyChart".  Sign up information is provided on this After Visit Summary.  MyChart is used to connect with patients for Virtual Visits (Telemedicine).  Patients are able to view lab/test results, encounter notes, upcoming appointments, etc.  Non-urgent messages can be sent to your provider as well.   To learn more about what you can do with MyChart, go to [email or web address].

## 2024-05-27 ENCOUNTER — Other Ambulatory Visit: Payer: Self-pay | Admitting: Physician Assistant

## 2024-05-27 DIAGNOSIS — E039 Hypothyroidism, unspecified: Secondary | ICD-10-CM

## 2024-05-30 ENCOUNTER — Ambulatory Visit: Attending: Internal Medicine | Admitting: Internal Medicine

## 2024-05-30 ENCOUNTER — Encounter: Payer: Self-pay | Admitting: Internal Medicine

## 2024-05-30 VITALS — BP 127/81 | HR 72 | Ht 72.0 in | Wt 215.0 lb

## 2024-05-30 DIAGNOSIS — E782 Mixed hyperlipidemia: Secondary | ICD-10-CM | POA: Diagnosis not present

## 2024-05-30 DIAGNOSIS — M79645 Pain in left finger(s): Secondary | ICD-10-CM

## 2024-05-30 DIAGNOSIS — Z23 Encounter for immunization: Secondary | ICD-10-CM | POA: Diagnosis not present

## 2024-05-30 DIAGNOSIS — R634 Abnormal weight loss: Secondary | ICD-10-CM | POA: Diagnosis not present

## 2024-05-30 DIAGNOSIS — E039 Hypothyroidism, unspecified: Secondary | ICD-10-CM | POA: Diagnosis not present

## 2024-05-30 DIAGNOSIS — R030 Elevated blood-pressure reading, without diagnosis of hypertension: Secondary | ICD-10-CM

## 2024-05-30 DIAGNOSIS — H538 Other visual disturbances: Secondary | ICD-10-CM

## 2024-05-30 DIAGNOSIS — J449 Chronic obstructive pulmonary disease, unspecified: Secondary | ICD-10-CM

## 2024-05-30 DIAGNOSIS — Z125 Encounter for screening for malignant neoplasm of prostate: Secondary | ICD-10-CM

## 2024-05-30 DIAGNOSIS — Z0000 Encounter for general adult medical examination without abnormal findings: Secondary | ICD-10-CM

## 2024-05-30 NOTE — Patient Instructions (Signed)
 VISIT SUMMARY:  Today, you had your annual physical exam. We discussed your current health status, including your thyroid  function, weight loss, blood pressure, cholesterol levels, knee stiffness, finger swelling, asthma, and vision changes. We also reviewed your general health maintenance needs, including vaccinations.  YOUR PLAN:  -HYPOTHYROIDISM: Hypothyroidism is a condition where your thyroid  gland does not produce enough thyroid  hormone. You are currently managing this with levothyroxine  137 mcg daily. We will check your TSH level to assess your current thyroid  function.  -WEIGHT LOSS: You have lost weight from 237 lbs to 215 lbs, likely due to increased physical activity. We will monitor your weight and symptoms, and re-evaluate if unexplained weight loss continues.  -PREHYPERTENSION: Prehypertension is when your blood pressure is higher than normal but not yet in the high blood pressure range. Your blood pressure today was 127/81. We recommend dietary modifications to reduce salt intake and increase fruits and vegetables. We will recheck your blood pressure in 4-6 weeks.  -HYPERLIPIDEMIA: Hyperlipidemia is when you have high levels of cholesterol in your blood. You are managing this with rosuvastatin  20 mg daily. We will check your cholesterol levels.  -KNEE PAIN: You experience stiffness and difficulty rising, possibly due to arthritis. We recommend taking Tylenol  325 mg, two tablets as needed for knee stiffness.  -FINGER SWELLING: You had swelling and pain in your left middle finger, which has resolved. This may be due to arthritis, given your family history.  -Obstructive Airway disease: Continue Advair  and Albuterol   -VISION CHANGES: You have blurry vision in your left eye. We have submitted a referral for an eye exam at [facility].  -GENERAL HEALTH MAINTENANCE: You are due for your second shingles vaccine and a pneumonia vaccine. We will administer these vaccines today. You do  not smoke or use excessive alcohol. We also recommend dietary modifications for blood pressure management.  INSTRUCTIONS:  Please follow up in 4-6 weeks to recheck your blood pressure. Additionally, get your cholesterol levels checked and attend the eye exam at [facility] as referred.

## 2024-05-30 NOTE — Progress Notes (Signed)
 Patient ID: Samuel Luna, male    DOB: 08-Nov-1961  MRN: [id number]  CC: Annual Exam (Physical. /L hand finger swelling - slight resolvement /Requesting thyroid  panel - experiencing increased fatigue /Yes to shingles vax )   Subjective: Charistopher Rumble is a 63 y.o. male who presents for annual exam.  Wife is with him. 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  History of Present Illness Samuel Luna is a 63 year old male who presents for an annual physical exam. He is accompanied by his wife.  HM: He is due for his second shingles vaccine and a pneumonia vaccine.   He manages hypothyroidism with levothyroxine  137 mcg daily, with TSH of 62 in March prompting an increase from 112 mcg. He has lost weight from 237 to 215 pounds since March according to our scale.  He reports very good appetite.  Patient attributes weight loss to increased physical activity stating that he walks his dog twice a day about 4 blocks and rides his bike about 2 times a week.  He denies any fever or night sweats.  No chronic cough.  No problems swallowing, abdominal pain or changes in bowel habits.  No problems passing his urine.  He experiences fatigue but no significant changes in appetite or heart palpitations. He sleeps well without significant snoring or witnessed apnea.  He takes rosuvastatin  20 mg daily for elevated cholesterol.   He notes blurriness in his left eye but has not had a recent eye exam.  He experiences stiffness in his right knee, especially after prolonged sitting, with a history of fluid removal in the past. No current pain but difficulty rising from a seated position.  He has mild obstructive lung  disease on PFTs done last year.  He uses an albuterol  inhaler as needed and takes Advair  twice daily. He experiences shortness of breath with long-distance walking but no chronic cough or chest pain. He quit smoking years ago and does not vape. He consumes alcohol occasionally.    Patient Active Problem List   Diagnosis Date Noted   Stage 3b chronic kidney disease (CKD) (HCC) 12/17/2022   Vitamin D  insufficiency 12/17/2022   Alcohol use disorder 12/17/2022   Hyperlipidemia 10/31/2022   History of colon polyps 10/31/2022   Brain injury (HCC)    Hypothyroidism 05/13/2008   HYPERCHOLESTEROLEMIA 05/13/2008   Mild cognitive impairment 05/12/2008   Bilateral chronic knee pain 05/12/2008   Idiopathic scoliosis and kyphoscoliosis 05/12/2008   SKIN RASH 05/12/2008     Current Outpatient Medications on File Prior to Visit  Medication Sig Dispense Refill   albuterol  (VENTOLIN  HFA) 108 (90 Base) MCG/ACT inhaler Inhale 1-2 puffs into the lungs every 4 (four) hours as needed for wheezing or shortness of breath. 54 each 2   colchicine  0.6 MG tablet Take 1 tablet (0.6 mg total) by mouth 2 (two) times daily. Take 2 tablets by mouth, then take 1 tablet by mouth 2 times daily. 8 tablet 0   cyclobenzaprine  (FLEXERIL ) 10 MG tablet Take 1 tablet (10 mg total) by mouth 2 (two) times daily as needed for muscle spasms. 20 tablet 0   diclofenac  Sodium (VOLTAREN ) 1 % GEL APPLY 2 G TOPICALLY 2 (TWO) TIMES DAILY  AS NEEDED. 100 g 1   fluticasone -salmeterol (ADVAIR  DISKUS) 250-50 MCG/ACT AEPB Inhale 1 puff into the lungs in the morning and at bedtime. 60 each 3   levothyroxine  (SYNTHROID ) 137 MCG tablet TAKE 1 TABLET BY MOUTH DAILY BEFORE BREAKFAST. 30 tablet 2   lidocaine  (LIDODERM ) 5 % Place 1 patch onto the skin daily. Remove & Discard patch within 12 hours or as directed by MD 30 patch 0   rosuvastatin  (CRESTOR ) 20 MG tablet Take 1 tablet (20 mg total) by mouth daily. 90 tablet 3   No current  facility-administered medications on file prior to visit.    No Known Allergies  Social History   Socioeconomic History   Marital status: Married    Spouse name: Not on file   Number of children: Not on file   Years of education: Not on file   Highest education level: Some college, no degree  Occupational History   Occupation: Fast Food  Tobacco Use   Smoking status: Former   Smokeless tobacco: Former   Tobacco comments:    Quit 12 years ago.   Vaping Use   Vaping status: Never Used  Substance and Sexual Activity   Alcohol use: Yes    Comment: occ   Drug use: No   Sexual activity: Not on file  Other Topics Concern   Not on file  Social History Narrative   Not on file   Social Drivers of Health   Financial Resource Strain: Low Risk  (05/29/2024)   Overall Financial Resource Strain (CARDIA)    Difficulty of Paying Living Expenses: Not very hard  Food Insecurity: Food Insecurity Present (05/29/2024)   Hunger Vital Sign    Worried About Running Out of Food in the Last Year: Never true    Ran Out of Food in the Last Year: Sometimes true  Transportation Needs: No Transportation Needs (05/29/2024)   PRAPARE - Administrator, Civil Service (Medical): No    Lack of Transportation (Non-Medical): No  Physical Activity: Sufficiently Active (05/29/2024)   Exercise Vital Sign    Days of Exercise per Week: 4 days    Minutes of Exercise per Session: 60 min  Stress: No Stress Concern Present (05/29/2024)   [employer] of Occupational Health - Occupational Stress Questionnaire    Feeling of Stress: Not at all  Social Connections: Socially Isolated (05/29/2024)   Social Connection and Isolation Panel    Frequency of Communication with Friends and Family: Never    Frequency of Social Gatherings with Friends and Family: Never    Attends Religious Services: Never    Database administrator or Organizations: No    Attends Engineer, structural: Not on file     Marital Status: Married  Catering manager Violence: Not on file    Family History  Problem Relation Age of Onset   Congestive Heart Failure Mother    Heart attack Maternal Grandmother    Heart attack Maternal Grandfather    Colon cancer Neg Hx    Esophageal cancer Neg Hx    Liver cancer Neg Hx    Pancreatic cancer Neg Hx    Rectal cancer Neg Hx    Stomach cancer Neg Hx    Colon polyps Neg Hx    Crohn's disease Neg Hx    Ulcerative colitis Neg Hx     Past Surgical History:  Procedure Laterality Date   COLONOSCOPY     LYMPHADENECTOMY Left 12/04/2014   2 lymph nodes  left arm   POLYPECTOMY      ROS: Review of Systems  HENT:         He is edentulous.  Wife states she had spoken with him in the past about getting a set of dentures but he was not interested.  Genitourinary:  Negative for difficulty urinating and hematuria.   Negative except as stated above  PHYSICAL EXAM: BP 127/81   Pulse 72   Ht 6' (1.829 m)   Wt 215 lb (97.5 kg)   SpO2 96%   BMI 29.16 kg/m   Wt Readings from Last 3 Encounters:  05/30/24 215 lb (97.5 kg)  04/30/24 222 lb (100.7 kg)  02/28/24 237 lb (107.5 kg)    Physical Exam General appearance -older Caucasian male in NAD.  He is slightly unkept with soiled clothing Mental status -patient is alert and oriented.  He answers most questions appropriately.  Wife assists with history. Eyes - pupils equal and reactive, extraocular eye movements intact Ears - bilateral TM's and external ear canals normal Nose - normal and patent, no erythema, discharge or polyps Mouth -patient is edentulous.  Throat is clear. Neck - supple, no significant adenopathy Lymphatics - no palpable lymphadenopathy, no hepatosplenomegaly Chest - clear to auscultation, no wheezes, rales or rhonchi, symmetric air entry Heart - normal rate, regular rhythm, normal S1, S2, no murmurs, rubs, clicks or gallops Abdomen - soft, nontender, nondistended, no masses or  organomegaly Musculoskeletal -no swelling noted of the left middle finger at this time.  He has good range of motion of the PIP and DIP joint. Knees: Mild joint enlargement bilaterally right greater than left.  He has good range of motion. Extremities - peripheral pulses normal, no pedal edema, no clubbing or cyanosis Neurologic: Power is 5/5 throughout in all fours.     Latest Ref Rng & Units 08/16/2023   10:10 AM 01/20/2023    8:50 AM 01/16/2023    2:47 PM  CMP  Glucose 70 - 99 mg/dL 897  89  90   BUN 8 - 23 mg/dL 12  26  18    Creatinine 0.61 - 1.24 mg/dL 8.62  8.63  8.65   Sodium 135 - 145 mmol/L 135  139  142   Potassium 3.5 - 5.1 mmol/L 4.3  4.2  4.4   Chloride 98 - 111 mmol/L 100  104  102   CO2 22 - 32 mmol/L 27  28  26    Calcium  8.9 - 10.3 mg/dL 9.2  9.5  9.6   Total Protein 6.5 - 8.1 g/dL  6.9    Total Bilirubin 0.3 - 1.2 mg/dL  0.5    Alkaline Phos 38 - 126 U/L  71    AST 15 - 41 U/L  16    ALT 0 - 44 U/L  14     Lipid Panel     Component Value Date/Time   CHOL 193 04/18/2023 0858   TRIG 167 (H) 04/18/2023 0858   HDL 44 04/18/2023 0858   CHOLHDL 4.4 04/18/2023 0858   CHOLHDL 4.3 Ratio 12/15/2008 2222   VLDL 27 12/15/2008 2222   LDLCALC 119 (H) 04/18/2023 0858    CBC    Component Value Date/Time   WBC 7.2 02/28/2024 1519   WBC 8.3 08/16/2023 1010   RBC 4.89 02/28/2024 1519   RBC 4.46 08/16/2023 1010   HGB 14.3 02/28/2024 1519   HCT 43.8 02/28/2024 1519   PLT 144 (L) 02/28/2024 1519   MCV 90 02/28/2024  1519   MCH 29.2 02/28/2024 1519   MCH 30.0 08/16/2023 1010   MCHC 32.6 02/28/2024 1519   MCHC 31.5 08/16/2023 1010   RDW 14.4 02/28/2024 1519   LYMPHSABS 1.7 02/28/2024 1519   MONOABS 0.6 01/20/2023 0850   EOSABS 0.2 02/28/2024 1519   BASOSABS 0.1 02/28/2024 1519    ASSESSMENT AND PLAN: 1. Annual physical exam (Primary) Encourage healthy eating habits.  Continue to move is much as he can.  2. Hypothyroidism, unspecified type Continue levothyroxine   137 mg daily.  Recheck thyroid  level today - TSH  3. Unexplained weight loss Patient with unexplained weight loss despite good appetite.  We will check some baseline blood test today including checking his thyroid  level.  We will bring him back in 6 weeks to reevaluate.  If thyroid  level is okay and weight loss continues, we will need to do imaging - CBC - Comprehensive metabolic panel with GFR  4. Mixed hyperlipidemia Continue Crestor  20 mg daily - Comprehensive metabolic panel with GFR - Lipid panel  5. Blurred vision, left eye - Ambulatory referral to Ophthalmology  6. Prehypertension DASH diet discussed and encouraged.  Will bring him back in 6 weeks for recheck of blood pressure  7. Finger pain, left Pain and swelling has resolved.  Not sure whether he had inadvertently injured the finger.  Follow-up if it reoccurs.  8. Obstructive airway disease (HCC) He will continue Advair  inhaler twice a day and albuterol  as needed  9. Need for shingles vaccine Second Shingrix  vaccine given today  10. Need for vaccination against Streptococcus pneumoniae - Pneumococcal conjugate vaccine 20-valent  11. Prostate cancer screening Patient agreeable to PSA for prostate cancer screening - PSA    Patient was given the opportunity to ask questions.  Patient verbalized understanding of the plan and was able to repeat key elements of the plan.   This documentation was completed using Paediatric nurse.  Any transcriptional errors are unintentional.  Orders Placed This Encounter  Procedures   Pneumococcal conjugate vaccine 20-valent   Varicella-zoster vaccine IM   TSH   CBC   Comprehensive metabolic panel with GFR   Lipid panel   PSA   Ambulatory referral to Ophthalmology     Requested Prescriptions    No prescriptions requested or ordered in this encounter    Return in about 6 weeks (around 07/11/2024) for BP recheck and weight recheck.  Barnie Louder, MD,  FACP

## 2024-05-31 ENCOUNTER — Ambulatory Visit: Payer: Self-pay | Admitting: Internal Medicine

## 2024-05-31 LAB — COMPREHENSIVE METABOLIC PANEL WITH GFR
ALT: 21 IU/L (ref 0–44)
AST: 22 IU/L (ref 0–40)
Albumin: 4.7 g/dL (ref 3.9–4.9)
Alkaline Phosphatase: 119 IU/L (ref 44–121)
BUN/Creatinine Ratio: 12 (ref 10–24)
BUN: 17 mg/dL (ref 8–27)
Bilirubin Total: 0.4 mg/dL (ref 0.0–1.2)
CO2: 22 mmol/L (ref 20–29)
Calcium: 10.3 mg/dL — ABNORMAL HIGH (ref 8.6–10.2)
Chloride: 103 mmol/L (ref 96–106)
Creatinine, Ser: 1.4 mg/dL — ABNORMAL HIGH (ref 0.76–1.27)
Globulin, Total: 2.4 g/dL (ref 1.5–4.5)
Glucose: 96 mg/dL (ref 70–99)
Potassium: 4.7 mmol/L (ref 3.5–5.2)
Sodium: 143 mmol/L (ref 134–144)
Total Protein: 7.1 g/dL (ref 6.0–8.5)
eGFR: 57 mL/min/{1.73_m2} — ABNORMAL LOW (ref >59)

## 2024-05-31 LAB — CBC
Hematocrit: 45.4 % (ref 37.5–51.0)
Hemoglobin: 14.7 g/dL (ref 13.0–17.7)
MCH: 29.8 pg (ref 26.6–33.0)
MCHC: 32.4 g/dL (ref 31.5–35.7)
MCV: 92 fL (ref 79–97)
Platelets: 165 10*3/uL (ref 150–450)
RBC: 4.93 x10E6/uL (ref 4.14–5.80)
RDW: 12.2 % (ref 11.6–15.4)
WBC: 8.1 10*3/uL (ref 3.4–10.8)

## 2024-05-31 LAB — TSH: TSH: 0.008 u[IU]/mL — ABNORMAL LOW (ref 0.450–4.500)

## 2024-05-31 LAB — LIPID PANEL
Chol/HDL Ratio: 4.5 ratio (ref 0.0–5.0)
Cholesterol, Total: 185 mg/dL (ref 100–199)
HDL: 41 mg/dL (ref >39)
LDL Chol Calc (NIH): 109 mg/dL — ABNORMAL HIGH (ref 0–99)
Triglycerides: 198 mg/dL — ABNORMAL HIGH (ref 0–149)
VLDL Cholesterol Cal: 35 mg/dL (ref 5–40)

## 2024-05-31 LAB — PSA: Prostate Specific Ag, Serum: 1.2 ng/mL (ref 0.0–4.0)

## 2024-05-31 MED ORDER — LEVOTHYROXINE SODIUM 100 MCG PO TABS: 100.0000 ug | ORAL_TABLET | Freq: Every day | ORAL | 3 refills | Status: DC

## 2024-05-31 NOTE — Progress Notes (Signed)
 Thyroid  hormone level is abnormal and likely causing the rapid weight loss.  -Hold of on taking the Levothyroxine , the thyroid  pill for 2 weeks. Then restart at lower dose of 100 mcg daily.  Updated prescription sent to his pharmacy. Will recheck level on next visit in August.  -screening test for prostate cancer is in normal range. -Kidney function not 100% but stable compared to when last checked in Sept. -Cholesterol level has improved. Make sure he takes the Rosuvastatin  daily.

## 2024-07-15 ENCOUNTER — Other Ambulatory Visit: Payer: Self-pay | Admitting: Physician Assistant

## 2024-07-15 DIAGNOSIS — K219 Gastro-esophageal reflux disease without esophagitis: Secondary | ICD-10-CM

## 2024-07-18 ENCOUNTER — Telehealth: Payer: Self-pay | Admitting: Internal Medicine

## 2024-07-18 LAB — LAB REPORT - SCANNED
Albumin, Urine POC: 3.5
Creatinine, POC: 139.6 mg/dL
Microalb Creat Ratio: 3

## 2024-07-18 NOTE — Telephone Encounter (Signed)
 Confirmed appt for 8/18

## 2024-07-21 ENCOUNTER — Ambulatory Visit: Attending: Internal Medicine | Admitting: Internal Medicine

## 2024-07-21 ENCOUNTER — Encounter: Payer: Self-pay | Admitting: Internal Medicine

## 2024-07-21 VITALS — BP 115/73 | HR 55 | Temp 97.7°F | Ht 72.0 in | Wt 215.0 lb

## 2024-07-21 DIAGNOSIS — Z6829 Body mass index (BMI) 29.0-29.9, adult: Secondary | ICD-10-CM

## 2024-07-21 DIAGNOSIS — R55 Syncope and collapse: Secondary | ICD-10-CM

## 2024-07-21 DIAGNOSIS — R634 Abnormal weight loss: Secondary | ICD-10-CM

## 2024-07-21 DIAGNOSIS — E039 Hypothyroidism, unspecified: Secondary | ICD-10-CM

## 2024-07-21 DIAGNOSIS — M25562 Pain in left knee: Secondary | ICD-10-CM

## 2024-07-21 MED ORDER — DICLOFENAC SODIUM 1 % EX GEL
2.0000 g | Freq: Two times a day (BID) | CUTANEOUS | 1 refills | Status: DC | PRN
Start: 2024-07-21 — End: 2024-10-29

## 2024-07-21 NOTE — Patient Instructions (Signed)
 STOP LEVOTHYROXINE  (also called Synthroid ) 137 mcg and 112 mcg until you hear back from me with the results of your thyroid  level that was drawn today.  Bring all medications with you on your next visit.

## 2024-07-21 NOTE — Progress Notes (Signed)
 Patient ID: Samuel Luna, male    DOB: 04-03-1961  MRN: [id number]  CC: Hypothyroidism (Hypothyroidism f/u. /Intermittent L knee pain X3 - 4 mo/Increased headaches - usually occurs during the heat )   Subjective: Samuel Luna is a 63 y.o. male who presents for 6 wks f/u BP recheck and weight loss.  Wife, is with him. 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  History of Present Illness   Samuel Luna is a 63 year old male who presents for a six-week follow-up to recheck blood pressure and weight loss. He is accompanied by his wife.  He has experienced weight loss, decreasing from 237 pounds in March to 215 pounds in June.  Wgh today is stable at 215 lbs. -TSH level on visit in June was 0.008.  I advised that the levothyroxine  137 mcg be stopped for 2 weeks then restart at 100 mcg daily.  The wife helps him manage his medications but there appears to be some confusion regarding the levothyroxine .  The dose was not held for 2 weeks as recommended and they were unsure whether they got the 100 mcg pill.  I placed a call to [facility] while in the exam room with them.  According to the pharmacist they recently refill the prescription for the 137 mcg pill the end of last month.  Prior to that he had filled a prescription for the 112 mcg for 38-month supply 05/01/2024. Never filled rxn for 100 mcg. Wife thinks that sometimes he was probably taking both the 137 mcg and the 112 mcg pills.   -He did not bring medicines with him today.  He has intermittent pain in his left knee for the past three to four months, which is the same knee he had seen orthopedics for in the past. He experiences soreness,  especially when walking, and has noticed some swelling. He has not had any recent falls. He previously had an x-ray of the knee in June of last year and received an injection that provided temporary relief. He currently takes Bufferin for the pain and is unsure if he still has any Voltaren  gel left.  His wife mentioned that he passed out in the living room a couple of months ago, which he did not report during his last visit in June. He described the incident as 'blanking out and waking up on the floor.'  No prior episode.  CBC on last visit was normal.      Patient Active Problem List   Diagnosis Date Noted   Stage 3b chronic kidney disease (CKD) (HCC) 12/17/2022   Vitamin D  insufficiency 12/17/2022   Alcohol use disorder 12/17/2022   Hyperlipidemia 10/31/2022   History of colon polyps 10/31/2022   Brain injury (HCC)    Hypothyroidism 05/13/2008   HYPERCHOLESTEROLEMIA 05/13/2008   Mild cognitive impairment 05/12/2008   Bilateral chronic knee pain 05/12/2008   Idiopathic scoliosis and kyphoscoliosis 05/12/2008   SKIN RASH 05/12/2008     Current Outpatient Medications on File Prior to Visit  Medication Sig Dispense Refill   albuterol  (VENTOLIN  HFA) 108 (90 Base) MCG/ACT inhaler Inhale 1-2 puffs into the lungs every 4 (four) hours as needed for wheezing or shortness of breath. 54  each 2   fluticasone -salmeterol (ADVAIR  DISKUS) 250-50 MCG/ACT AEPB Inhale 1 puff into the lungs in the morning and at bedtime. 60 each 3   levothyroxine  (SYNTHROID ) 100 MCG tablet Take 1 tablet (100 mcg total) by mouth daily. Discontinue the 137 mcg dose. 30 tablet 3   rosuvastatin  (CRESTOR ) 20 MG tablet Take 1 tablet (20 mg total) by mouth daily. 90 tablet 3   colchicine  0.6 MG tablet Take 1 tablet (0.6 mg total) by mouth 2 (two) times daily. Take 2 tablets by mouth, then take 1 tablet by mouth 2 times daily. (Patient not taking: Reported on 07/21/2024) 8 tablet 0   No current facility-administered medications  on file prior to visit.    No Known Allergies  Social History   Socioeconomic History   Marital status: Married    Spouse name: Not on file   Number of children: Not on file   Years of education: Not on file   Highest education level: Some college, no degree  Occupational History   Occupation: Fast Food  Tobacco Use   Smoking status: Former   Smokeless tobacco: Former   Tobacco comments:    Quit 12 years ago.   Vaping Use   Vaping status: Never Used  Substance and Sexual Activity   Alcohol use: Yes    Comment: occ   Drug use: No   Sexual activity: Not on file  Other Topics Concern   Not on file  Social History Narrative   Not on file   Social Drivers of Health   Financial Resource Strain: Low Risk  (05/29/2024)   Overall Financial Resource Strain (CARDIA)    Difficulty of Paying Living Expenses: Not very hard  Food Insecurity: Food Insecurity Present (05/29/2024)   Hunger Vital Sign    Worried About Running Out of Food in the Last Year: Never true    Ran Out of Food in the Last Year: Sometimes true  Transportation Needs: No Transportation Needs (05/29/2024)   PRAPARE - Administrator, Civil Service (Medical): No    Lack of Transportation (Non-Medical): No  Physical Activity: Sufficiently Active (05/29/2024)   Exercise Vital Sign    Days of Exercise per Week: 4 days    Minutes of Exercise per Session: 60 min  Stress: No Stress Concern Present (05/29/2024)   [employer] of Occupational Health - Occupational Stress Questionnaire    Feeling of Stress: Not at all  Social Connections: Socially Isolated (05/29/2024)   Social Connection and Isolation Panel    Frequency of Communication with Friends and Family: Never    Frequency of Social Gatherings with Friends and Family: Never    Attends Religious Services: Never    Database administrator or Organizations: No    Attends Engineer, structural: Not on file    Marital Status: Married  Careers information officer Violence: Not on file    Family History  Problem Relation Age of Onset   Congestive Heart Failure Mother    Heart attack Maternal Grandmother    Heart attack Maternal Grandfather    Colon cancer Neg Hx    Esophageal cancer Neg Hx    Liver cancer Neg Hx    Pancreatic cancer Neg Hx    Rectal cancer Neg Hx    Stomach cancer Neg Hx    Colon polyps Neg Hx    Crohn's disease Neg Hx    Ulcerative colitis Neg Hx     Past Surgical History:  Procedure Laterality  Date   COLONOSCOPY     LYMPHADENECTOMY Left 12/04/2014   2 lymph nodes left arm   POLYPECTOMY      ROS: Review of Systems Negative except as stated above  PHYSICAL EXAM: BP 115/73 (BP Location: Left Arm, Patient Position: Sitting, Cuff Size: Normal)   Pulse (!) 55   Temp 97.7 F (36.5 C) (Oral)   Ht 6' (1.829 m)   Wt 215 lb (97.5 kg)   SpO2 97%   BMI 29.16 kg/m   Wt Readings from Last 3 Encounters:  07/21/24 215 lb (97.5 kg)  05/30/24 215 lb (97.5 kg)  04/30/24 222 lb (100.7 kg)    Physical Exam  General appearance - alert, well appearing, and in no distress Mental status -older Caucasian male in NAD.  Patient is a poor historian Neck - supple, no significant adenopathy Chest - clear to auscultation, no wheezes, rales or rhonchi, symmetric air entry Heart - normal rate, regular rhythm, normal S1, S2, no murmurs, rubs, clicks or gallops Musculoskeletal -left knee: Mild joint enlargement compared to the right.  No point tenderness.  Good range of motion with mild crepitus.     Latest Ref Rng & Units 05/30/2024    3:36 PM 08/16/2023   10:10 AM 01/20/2023    8:50 AM  CMP  Glucose 70 - 99 mg/dL 96  897  89   BUN 8 - 27 mg/dL 17  12  26    Creatinine 0.76 - 1.27 mg/dL 8.59  8.62  8.63   Sodium 134 - 144 mmol/L 143  135  139   Potassium 3.5 - 5.2 mmol/L 4.7  4.3  4.2   Chloride 96 - 106 mmol/L 103  100  104   CO2 20 - 29 mmol/L 22  27  28    Calcium  8.6 - 10.2 mg/dL 89.6  9.2  9.5   Total Protein 6.0 -  8.5 g/dL 7.1   6.9   Total Bilirubin 0.0 - 1.2 mg/dL 0.4   0.5   Alkaline Phos 44 - 121 IU/L 119   71   AST 0 - 40 IU/L 22   16   ALT 0 - 44 IU/L 21   14    Lipid Panel     Component Value Date/Time   CHOL 185 05/30/2024 1536   TRIG 198 (H) 05/30/2024 1536   HDL 41 05/30/2024 1536   CHOLHDL 4.5 05/30/2024 1536   CHOLHDL 4.3 Ratio 12/15/2008 2222   VLDL 27 12/15/2008 2222   LDLCALC 109 (H) 05/30/2024 1536    CBC    Component Value Date/Time   WBC 8.1 05/30/2024 1536   WBC 8.3 08/16/2023 1010   RBC 4.93 05/30/2024 1536   RBC 4.46 08/16/2023 1010   HGB 14.7 05/30/2024 1536   HCT 45.4 05/30/2024 1536   PLT 165 05/30/2024 1536   MCV 92 05/30/2024 1536   MCH 29.8 05/30/2024 1536   MCH 30.0 08/16/2023 1010   MCHC 32.4 05/30/2024 1536   MCHC 31.5 08/16/2023 1010   RDW 12.2 05/30/2024 1536   LYMPHSABS 1.7 02/28/2024 1519   MONOABS 0.6 01/20/2023 0850   EOSABS 0.2 02/28/2024 1519   BASOSABS 0.1 02/28/2024 1519    ASSESSMENT AND PLAN: 1. Hypothyroidism, unspecified type (Primary) 2. Weight loss due to medication Patient with weight loss most likely due to levothyroxine  induced hyperthyroid state. -Since there has been confusion about the dose of the levothyroxine  with neither patient nor his wife being able to tell me what dose  he is currently taking, I have to assume that he is taking the 137 mcg dose since he recently had it filled the end of last month.  I asked them to hold off on taking the levothyroxine  completely.  We will recheck thyroid  level today.  Further instructions will be given based on results. -I also [facility] to [facility] any further refills on the 112 and 130 7 mcg levothyroxine  pills. -Follow-up with me in about 2 weeks.  Advised to bring all of his medicines with him at that time. - TSH  3. Arthralgia of left knee - diclofenac  Sodium (VOLTAREN ) 1 % GEL; Apply 2 g topically 2 (two) times daily as needed.  Dispense: 100 g; Refill: 1 - AMB  referral to orthopedics  4. Syncope, unspecified syncope type This occurred 2 months ago and we did not have time to go into this/address today.  We will address on subsequent visit.     Patient was given the opportunity to ask questions.  Patient verbalized understanding of the plan and was able to repeat key elements of the plan.   This documentation was completed using Paediatric nurse.  Any transcriptional errors are unintentional.  Orders Placed This Encounter  Procedures   TSH   AMB referral to orthopedics     Requested Prescriptions   Signed Prescriptions Disp Refills   diclofenac  Sodium (VOLTAREN ) 1 % GEL 100 g 1    Sig: Apply 2 g topically 2 (two) times daily as needed.    Return in about 2 weeks (around 08/04/2024) for bring all medications with you.  Barnie Louder, MD, FACP

## 2024-07-22 LAB — TSH: TSH: <0.005 u[IU]/mL — ABNORMAL LOW (ref 0.450–4.500)

## 2024-07-23 ENCOUNTER — Ambulatory Visit: Payer: Self-pay | Admitting: Internal Medicine

## 2024-07-23 NOTE — Telephone Encounter (Signed)
 Phone call placed to pt today. His wife answered but pt was present. I asked to be put on speaker phone.  I informed pt and spouse that I received the results of thyroid  lab test and I wanted to confirm that he has held off on taking the Levothyroxine  as recommended on OV 2 days ago.  Wife said he was still taking it but will hold it starting today.  I recommended that he holds off on restarting Levothyroxine  until Sept 6. At this point wife stated that they were out and she would have to call me back. I told her that I will have my [facility] call them back. Please refer to my lab note sent to CMA.

## 2024-08-05 ENCOUNTER — Ambulatory Visit (INDEPENDENT_AMBULATORY_CARE_PROVIDER_SITE_OTHER): Admitting: Orthopaedic Surgery

## 2024-08-05 ENCOUNTER — Other Ambulatory Visit (INDEPENDENT_AMBULATORY_CARE_PROVIDER_SITE_OTHER)

## 2024-08-05 DIAGNOSIS — M1712 Unilateral primary osteoarthritis, left knee: Secondary | ICD-10-CM

## 2024-08-05 MED ORDER — BUPIVACAINE HCL 0.5 % IJ SOLN
2.0000 mL | INTRAMUSCULAR | Status: AC | PRN
  Administered 2024-08-05: 2 mL via INTRA_ARTICULAR

## 2024-08-05 MED ORDER — LIDOCAINE HCL 1 % IJ SOLN
2.0000 mL | INTRAMUSCULAR | Status: AC | PRN
  Administered 2024-08-05: 2 mL

## 2024-08-05 MED ORDER — METHYLPREDNISOLONE ACETATE 40 MG/ML IJ SUSP
40.0000 mg | INTRAMUSCULAR | Status: AC | PRN
  Administered 2024-08-05: 40 mg via INTRA_ARTICULAR

## 2024-08-05 NOTE — Progress Notes (Signed)
 Office Visit Note   Patient: Samuel Luna           Date of Birth: [birth year]           MRN: [id number] Visit Date: 08/05/2024              Requested by: Vicci Barnie NOVAK, MD [address] [place],  [place] [address] PCP: Vicci Barnie NOVAK, MD   Assessment & Plan: Visit Diagnoses:  1. Primary osteoarthritis of left knee     Plan: History of Present Illness Maximino Cozzolino is a 63 year old male with arthritis who presents with left knee pain. He is accompanied by his wife, Mrs. Tavenner.  He experiences left knee pain for several months, described as a sensation of the knee 'wanting to give out.' There is no recent knee injury. He previously underwent aspiration and injection in the knee. There is no swelling in the knee. He has a history of gout in the big toe, but it is not currently problematic. He is disabled due to scoliosis and partial brain damage but does not experience significant pain when biking or walking extensively.  Physical Exam MUSCULOSKELETAL: Normal range of motion in left knee. Tenderness in left knee on palpation.  Normal strength and sensory function.  Collaterals and cruciates are stable.  Normal range of motion.  Assessment and Plan Left knee patellofemoral osteoarthritis with pain Chronic pain from patellofemoral osteoarthritis, worsened by movement. Prefers cortisone injection due to difficulty with oral medications. - Administer left knee cortisone injection.  Tolerated well.  Follow-Up Instructions: No follow-ups on file.   Orders:  Orders Placed This Encounter  Procedures   Large Joint Inj   XR KNEE 3 VIEW LEFT   No orders of the defined types were placed in this encounter.     Procedures: Large Joint Inj: L knee on 08/05/2024 10:44 AM Details: 22 G needle Medications: 2 mL bupivacaine  0.5 %; 2 mL lidocaine  1 %; 40 mg methylPREDNISolone  acetate 40 MG/ML Outcome: tolerated well, no immediate complications Patient was prepped and  draped in the usual sterile fashion.       Clinical Data: No additional findings.   Subjective: Chief Complaint  Patient presents with   Left Knee - Pain    HPI  Review of Systems  Constitutional: Negative.   HENT: Negative.    Eyes: Negative.   Respiratory: Negative.    Cardiovascular: Negative.   Gastrointestinal: Negative.   Endocrine: Negative.   Genitourinary: Negative.   Skin: Negative.   Allergic/Immunologic: Negative.   Neurological: Negative.   Hematological: Negative.   Psychiatric/Behavioral: Negative.    All other systems reviewed and are negative.    Objective: Vital Signs: There were no vitals taken for this visit.  Physical Exam Vitals and nursing note reviewed.  Constitutional:      Appearance: He is well-developed.  HENT:     Head: Normocephalic and atraumatic.  Eyes:     Pupils: Pupils are equal, round, and reactive to light.  Pulmonary:     Effort: Pulmonary effort is normal.  Abdominal:     Palpations: Abdomen is soft.  Musculoskeletal:        General: Normal range of motion.     Cervical back: Neck supple.  Skin:    General: Skin is warm.  Neurological:     Mental Status: He is alert and oriented to person, place, and time.  Psychiatric:        Behavior: Behavior normal.  Thought Content: Thought content normal.        Judgment: Judgment normal.     Ortho Exam  Specialty Comments:  No specialty comments available.  Imaging: XR KNEE 3 VIEW LEFT Result Date: 08/05/2024 X-rays of the left knee shows mainly arthritic changes in the patellofemoral compartment with spurring.    PMFS History: Patient Active Problem List   Diagnosis Date Noted   Stage 3b chronic kidney disease (CKD) (HCC) 12/17/2022   Vitamin D  insufficiency 12/17/2022   Alcohol use disorder 12/17/2022   Hyperlipidemia 10/31/2022   History of colon polyps 10/31/2022   Brain injury (HCC)    Hypothyroidism 05/13/2008   HYPERCHOLESTEROLEMIA  05/13/2008   Mild cognitive impairment 05/12/2008   Bilateral chronic knee pain 05/12/2008   Idiopathic scoliosis and kyphoscoliosis 05/12/2008   SKIN RASH 05/12/2008   Past Medical History:  Diagnosis Date   Arthritis    knees   Brain injury (HCC)    Chronic kidney disease    mild   GERD (gastroesophageal reflux disease)    Scoliosis    Thyroid  disease    hypothyroidism    Family History  Problem Relation Age of Onset   Congestive Heart Failure Mother    Heart attack Maternal Grandmother    Heart attack Maternal Grandfather    Colon cancer Neg Hx    Esophageal cancer Neg Hx    Liver cancer Neg Hx    Pancreatic cancer Neg Hx    Rectal cancer Neg Hx    Stomach cancer Neg Hx    Colon polyps Neg Hx    Crohn's disease Neg Hx    Ulcerative colitis Neg Hx     Past Surgical History:  Procedure Laterality Date   COLONOSCOPY     LYMPHADENECTOMY Left 12/04/2014   2 lymph nodes left arm   POLYPECTOMY     Social History   Occupational History   Occupation: Fast Food  Tobacco Use   Smoking status: Former   Smokeless tobacco: Former   Tobacco comments:    Quit 12 years ago.   Vaping Use   Vaping status: Never Used  Substance and Sexual Activity   Alcohol use: Yes    Comment: occ   Drug use: No   Sexual activity: Not on file

## 2024-08-06 ENCOUNTER — Telehealth: Payer: Self-pay | Admitting: Internal Medicine

## 2024-08-06 MED ORDER — LEVOTHYROXINE SODIUM 100 MCG PO TABS: 100.0000 ug | ORAL_TABLET | Freq: Every day | ORAL | 3 refills | Status: DC

## 2024-08-06 NOTE — Telephone Encounter (Signed)
 Please confirm with pt and his wife that he has remained off the Levothyroxine  since I spoke with them 07/23/2024. If so, then he can restart it starting 08/09/2024 at the dose of 100 mcg daily. Updated rxn sent to his pharmacy. Bring all med bottles with him on next visit with me 08/15/24.

## 2024-08-06 NOTE — Telephone Encounter (Signed)
 FYISABRA [employer] & spoke to MS.Glantz (authorized to receive information per DPR on file) to call back.. Ms.Davidson confirmed that the patient has remained off of levothyroxine  since 07/23/2024. Gave reminder that he can restart Levothyroxine  on 08/09/2024 of 100 mcg. Informed that an updated prescription has been sent to the pharmacy. Reminded to bring all medication bottles to upcoming appointment on 08/15/2024. Ms.Tagliaferri expressed verbal understanding of all discussed and confirmed appointment. No further questions at this time.

## 2024-08-15 ENCOUNTER — Encounter: Payer: Self-pay | Admitting: Internal Medicine

## 2024-08-15 ENCOUNTER — Ambulatory Visit: Attending: Internal Medicine | Admitting: Internal Medicine

## 2024-08-15 VITALS — BP 127/84 | HR 56 | Temp 98.0°F | Ht 72.0 in | Wt 216.0 lb

## 2024-08-15 DIAGNOSIS — Z87891 Personal history of nicotine dependence: Secondary | ICD-10-CM

## 2024-08-15 DIAGNOSIS — Z23 Encounter for immunization: Secondary | ICD-10-CM | POA: Diagnosis not present

## 2024-08-15 DIAGNOSIS — Z79890 Hormone replacement therapy (postmenopausal): Secondary | ICD-10-CM

## 2024-08-15 DIAGNOSIS — E039 Hypothyroidism, unspecified: Secondary | ICD-10-CM | POA: Diagnosis not present

## 2024-08-15 DIAGNOSIS — M25562 Pain in left knee: Secondary | ICD-10-CM | POA: Diagnosis not present

## 2024-08-15 NOTE — Progress Notes (Signed)
 Patient ID: Samuel Luna, male    DOB: 1960/12/24  MRN: [id number]  CC: Follow-up (Follow-up. /No questions / questions /Flu vax administered on 08/15/2024 - C.A.)   Subjective: Samuel Luna is a 63 y.o. male who presents for chronic ds management. Wife and 2 small grandsons are  with him. 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  History of Present Illness Samuel Luna is a 63 year old male who presents for follow-up regarding his thyroid  medication management.  He was previously on a dose of 137 micrograms of levothyroxine , which was determined to be too high based on his thyroid  levels. He stopped taking the medication on August 20th and restarted at a lower dose of 100 micrograms on September 6th as discussed on my phone call to him. He has been compliant with this adjustment and is currently taking levothyroxine  100 micrograms. His weight has been stable since the last visit, with no further weight changes.  He is also taking rosuvastatin  20 milligrams for cholesterol and uses Advair  and Ventolin  inhalers.  He recently saw an orthopedic doctor for knee pain and received an injection in his left knee, which provided immediate pain relief, although the pain slightly returned later.    Patient Active Problem List   Diagnosis Date Noted   Stage 3b chronic kidney disease (CKD) (HCC) 12/17/2022   Vitamin D  insufficiency 12/17/2022   Alcohol use disorder 12/17/2022   Hyperlipidemia 10/31/2022   History of colon polyps 10/31/2022   Brain injury (HCC)    Hypothyroidism 05/13/2008   HYPERCHOLESTEROLEMIA 05/13/2008   Mild cognitive impairment 05/12/2008   Bilateral chronic knee pain  05/12/2008   Idiopathic scoliosis and kyphoscoliosis 05/12/2008   SKIN RASH 05/12/2008     Current Outpatient Medications on File Prior to Visit  Medication Sig Dispense Refill   albuterol  (VENTOLIN  HFA) 108 (90 Base) MCG/ACT inhaler Inhale 1-2 puffs into the lungs every 4 (four) hours as needed for wheezing or shortness of breath. 54 each 2   diclofenac  Sodium (VOLTAREN ) 1 % GEL Apply 2 g topically 2 (two) times daily as needed. 100 g 1   fluticasone -salmeterol (ADVAIR  DISKUS) 250-50 MCG/ACT AEPB Inhale 1 puff into the lungs in the morning and at bedtime. 60 each 3   levothyroxine  (SYNTHROID ) 100 MCG tablet Take 1 tablet (100 mcg total) by mouth daily. Discontinue the 137 mcg dose. 30 tablet 3   rosuvastatin  (CRESTOR ) 20 MG tablet Take 1 tablet (20 mg total) by mouth daily. 90 tablet 3   colchicine  0.6 MG tablet Take 1 tablet (0.6 mg total) by mouth 2 (two) times daily. Take 2 tablets by mouth, then take 1 tablet by mouth 2 times daily. (Patient not taking: Reported on 08/15/2024) 8 tablet 0   No current facility-administered medications on file prior to visit.    No Known Allergies  Social History   Socioeconomic History   Marital status: Married    Spouse name: Not on file   Number of children: Not on file   Years of education: Not on file   Highest education level: Some college, no degree  Occupational History   Occupation: [employer]  Tobacco Use   Smoking status: Former   Smokeless tobacco:  Former   Tobacco comments:    Quit 12 years ago.   Vaping Use   Vaping status: Never Used  Substance and Sexual Activity   Alcohol use: Yes    Comment: occ   Drug use: No   Sexual activity: Not on file  Other Topics Concern   Not on file  Social History Narrative   Not on file   Social Drivers of Health   Financial Resource Strain: Low Risk  (08/15/2024)   Overall Financial Resource Strain (CARDIA)    Difficulty of Paying Living Expenses: Not very hard  Food Insecurity: No  Food Insecurity (08/15/2024)   Hunger Vital Sign    Worried About Running Out of Food in the Last Year: Never true    Ran Out of Food in the Last Year: Never true  Recent Concern: Food Insecurity - Food Insecurity Present (05/29/2024)   Hunger Vital Sign    Worried About Running Out of Food in the Last Year: Never true    Ran Out of Food in the Last Year: Sometimes true  Transportation Needs: No Transportation Needs (08/15/2024)   PRAPARE - Administrator, Civil Service (Medical): No    Lack of Transportation (Non-Medical): No  Physical Activity: Sufficiently Active (08/15/2024)   Exercise Vital Sign    Days of Exercise per Week: 5 days    Minutes of Exercise per Session: 30 min  Stress: No Stress Concern Present (08/15/2024)   [employer] of Occupational Health - Occupational Stress Questionnaire    Feeling of Stress: Not at all  Social Connections: Socially Isolated (08/15/2024)   Social Connection and Isolation Panel    Frequency of Communication with Friends and Family: Once a week    Frequency of Social Gatherings with Friends and Family: Once a week    Attends Religious Services: Never    Database administrator or Organizations: No    Attends Banker Meetings: Never    Marital Status: Married  Catering manager Violence: Not At Risk (08/15/2024)   Humiliation, Afraid, Rape, and Kick questionnaire    Fear of Current or Ex-Partner: No    Emotionally Abused: No    Physically Abused: No    Sexually Abused: No    Family History  Problem Relation Age of Onset   Congestive Heart Failure Mother    Heart attack Maternal Grandmother    Heart attack Maternal Grandfather    Colon cancer Neg Hx    Esophageal cancer Neg Hx    Liver cancer Neg Hx    Pancreatic cancer Neg Hx    Rectal cancer Neg Hx    Stomach cancer Neg Hx    Colon polyps Neg Hx    Crohn's disease Neg Hx    Ulcerative colitis Neg Hx     Past Surgical History:  Procedure Laterality Date    COLONOSCOPY     LYMPHADENECTOMY Left 12/04/2014   2 lymph nodes left arm   POLYPECTOMY      ROS: Review of Systems Negative except as stated above  PHYSICAL EXAM: BP 127/84 (BP Location: Left Arm, Patient Position: Sitting, Cuff Size: Normal)   Pulse (!) 56   Temp 98 F (36.7 C) (Oral)   Ht 6' (1.829 m)   Wt 216 lb (98 kg)   SpO2 98%   BMI 29.29 kg/m   Wt Readings from Last 3 Encounters:  08/15/24 216 lb (98 kg)  07/21/24 215 lb (97.5 kg)  05/30/24 215 lb (97.5  kg)    Physical Exam  General appearance - alert, well appearing, older caucasian male and in no distress Mental status - normal mood, behavior, speech, dress, motor activity, and thought processes Chest - sounds mildly decrease Heart - mild brady but regular.     Latest Ref Rng & Units 05/30/2024    3:36 PM 08/16/2023   10:10 AM 01/20/2023    8:50 AM  CMP  Glucose 70 - 99 mg/dL 96  897  89   BUN 8 - 27 mg/dL 17  12  26    Creatinine 0.76 - 1.27 mg/dL 8.59  8.62  8.63   Sodium 134 - 144 mmol/L 143  135  139   Potassium 3.5 - 5.2 mmol/L 4.7  4.3  4.2   Chloride 96 - 106 mmol/L 103  100  104   CO2 20 - 29 mmol/L 22  27  28    Calcium  8.6 - 10.2 mg/dL 89.6  9.2  9.5   Total Protein 6.0 - 8.5 g/dL 7.1   6.9   Total Bilirubin 0.0 - 1.2 mg/dL 0.4   0.5   Alkaline Phos 44 - 121 IU/L 119   71   AST 0 - 40 IU/L 22   16   ALT 0 - 44 IU/L 21   14    Lipid Panel     Component Value Date/Time   CHOL 185 05/30/2024 1536   TRIG 198 (H) 05/30/2024 1536   HDL 41 05/30/2024 1536   CHOLHDL 4.5 05/30/2024 1536   CHOLHDL 4.3 Ratio 12/15/2008 2222   VLDL 27 12/15/2008 2222   LDLCALC 109 (H) 05/30/2024 1536    CBC    Component Value Date/Time   WBC 8.1 05/30/2024 1536   WBC 8.3 08/16/2023 1010   RBC 4.93 05/30/2024 1536   RBC 4.46 08/16/2023 1010   HGB 14.7 05/30/2024 1536   HCT 45.4 05/30/2024 1536   PLT 165 05/30/2024 1536   MCV 92 05/30/2024 1536   MCH 29.8 05/30/2024 1536   MCH 30.0 08/16/2023 1010    MCHC 32.4 05/30/2024 1536   MCHC 31.5 08/16/2023 1010   RDW 12.2 05/30/2024 1536   LYMPHSABS 1.7 02/28/2024 1519   MONOABS 0.6 01/20/2023 0850   EOSABS 0.2 02/28/2024 1519   BASOSABS 0.1 02/28/2024 1519    ASSESSMENT AND PLAN: 1. Hypothyroidism, unspecified type (Primary) Patient to continue levothyroxine  100 mcg daily.  Advised that he returns to the lab for the second week in October to have TSH rechecked.  2. Arthralgia of left knee Recently received injection to the left knee with good results.  3. Need for influenza vaccination Flu shot given today.   Patient was given the opportunity to ask questions.  Patient verbalized understanding of the plan and was able to repeat key elements of the plan.   This documentation was completed using Paediatric nurse.  Any transcriptional errors are unintentional.  Orders Placed This Encounter  Procedures   Flu vaccine trivalent PF, 6mos and older(Flulaval,Afluria,Fluarix,Fluzone)   TSH     Requested Prescriptions    No prescriptions requested or ordered in this encounter    Return in about 4 months (around 12/15/2024) for Give lab appt for 2 nd week of October.  Barnie Louder, MD, FACP

## 2024-08-15 NOTE — Patient Instructions (Signed)
 VISIT SUMMARY:  Samuel Luna, you had a follow-up appointment to discuss your thyroid  medication and other health concerns. Your thyroid  medication dose was adjusted, and you have been compliant with the new dose. We also discussed your knee pain and general health maintenance.  YOUR PLAN:  -KNEE PAIN: You received an injection in your left knee, which initially relieved the pain, but you have noticed some pain returning. Knee pain can be due to various reasons, including arthritis or injury. Continue to monitor your symptoms and follow up with your orthopedic doctor if the pain persists or worsens.  -HYPOTHYROIDISM: Hypothyroidism is a condition where your thyroid  gland does not produce enough thyroid  hormone. Your levothyroxine  dose was adjusted from 137 micrograms to 100 micrograms, and your weight has stabilized, indicating improvement. Continue taking levothyroxine  100 micrograms daily. We will recheck your thyroid  levels in the second week of October to ensure the dosage is correct.  -GENERAL HEALTH MAINTENANCE: You are due for your influenza vaccine. The standard dose is appropriate for your age. Keeping up with vaccinations is important for preventing illness.  INSTRUCTIONS:  Please schedule a lab appointment to recheck your thyroid  levels in the second week of October. Additionally, make sure to get your influenza vaccine as you are due for it.

## 2024-09-15 ENCOUNTER — Ambulatory Visit: Attending: Family Medicine

## 2024-09-15 DIAGNOSIS — E039 Hypothyroidism, unspecified: Secondary | ICD-10-CM

## 2024-09-16 ENCOUNTER — Ambulatory Visit: Payer: Self-pay | Admitting: Internal Medicine

## 2024-09-16 ENCOUNTER — Other Ambulatory Visit: Payer: Self-pay | Admitting: Internal Medicine

## 2024-09-16 DIAGNOSIS — E039 Hypothyroidism, unspecified: Secondary | ICD-10-CM

## 2024-09-16 LAB — TSH: TSH: 11.3 u[IU]/mL — ABNORMAL HIGH (ref 0.450–4.500)

## 2024-09-16 MED ORDER — LEVOTHYROXINE SODIUM 125 MCG PO TABS: 125.0000 ug | ORAL_TABLET | Freq: Every day | ORAL | 5 refills | Status: DC

## 2024-10-06 ENCOUNTER — Encounter: Payer: Self-pay | Admitting: Radiology

## 2024-10-12 ENCOUNTER — Other Ambulatory Visit: Payer: Self-pay

## 2024-10-12 ENCOUNTER — Emergency Department (HOSPITAL_COMMUNITY)
Admission: EM | Admit: 2024-10-12 | Discharge: 2024-10-12 | Disposition: A | Discharge status: Home / Self Care | Attending: Emergency Medicine | Admitting: Emergency Medicine

## 2024-10-12 ENCOUNTER — Emergency Department (HOSPITAL_COMMUNITY)

## 2024-10-12 DIAGNOSIS — R059 Cough, unspecified: Secondary | ICD-10-CM | POA: Diagnosis present

## 2024-10-12 DIAGNOSIS — Z7951 Long term (current) use of inhaled steroids: Secondary | ICD-10-CM | POA: Insufficient documentation

## 2024-10-12 DIAGNOSIS — Z87891 Personal history of nicotine dependence: Secondary | ICD-10-CM | POA: Insufficient documentation

## 2024-10-12 DIAGNOSIS — J441 Chronic obstructive pulmonary disease with (acute) exacerbation: Secondary | ICD-10-CM | POA: Diagnosis not present

## 2024-10-12 LAB — CBC WITH DIFFERENTIAL/PLATELET
Abs Immature Granulocytes: 0.01 K/uL (ref 0.00–0.07)
Basophils Absolute: 0.1 K/uL (ref 0.0–0.1)
Basophils Relative: 1 %
Eosinophils Absolute: 1.1 K/uL — ABNORMAL HIGH (ref 0.0–0.5)
Eosinophils Relative: 13 %
HCT: 44.9 % (ref 39.0–52.0)
Hemoglobin: 14.4 g/dL (ref 13.0–17.0)
Immature Granulocytes: 0 %
Lymphocytes Relative: 21 %
Lymphs Abs: 1.7 K/uL (ref 0.7–4.0)
MCH: 30.8 pg (ref 26.0–34.0)
MCHC: 32.1 g/dL (ref 30.0–36.0)
MCV: 95.9 fL (ref 80.0–100.0)
Monocytes Absolute: 0.8 K/uL (ref 0.1–1.0)
Monocytes Relative: 10 %
Neutro Abs: 4.5 K/uL (ref 1.7–7.7)
Neutrophils Relative %: 55 %
Platelets: 168 K/uL (ref 150–400)
RBC: 4.68 MIL/uL (ref 4.22–5.81)
RDW: 14.8 % (ref 11.5–15.5)
WBC: 8.2 K/uL (ref 4.0–10.5)
nRBC: 0 % (ref 0.0–0.2)

## 2024-10-12 LAB — COMPREHENSIVE METABOLIC PANEL WITH GFR
ALT: 20 U/L (ref 0–44)
AST: 24 U/L (ref 15–41)
Albumin: 4.6 g/dL (ref 3.5–5.0)
Alkaline Phosphatase: 118 U/L (ref 38–126)
Anion gap: 12 (ref 5–15)
BUN: 12 mg/dL (ref 8–23)
CO2: 27 mmol/L (ref 22–32)
Calcium: 9.4 mg/dL (ref 8.9–10.3)
Chloride: 104 mmol/L (ref 98–111)
Creatinine, Ser: 1.53 mg/dL — ABNORMAL HIGH (ref 0.61–1.24)
GFR, Estimated: 51 mL/min — ABNORMAL LOW (ref >60)
Glucose, Bld: 114 mg/dL — ABNORMAL HIGH (ref 70–99)
Potassium: 3.9 mmol/L (ref 3.5–5.1)
Sodium: 142 mmol/L (ref 135–145)
Total Bilirubin: 0.6 mg/dL (ref 0.0–1.2)
Total Protein: 7.5 g/dL (ref 6.5–8.1)

## 2024-10-12 LAB — RESP PANEL BY RT-PCR (RSV, FLU A&B, COVID)  RVPGX2
Influenza A by PCR: NEGATIVE
Influenza B by PCR: NEGATIVE
Resp Syncytial Virus by PCR: NEGATIVE
SARS Coronavirus 2 by RT PCR: NEGATIVE

## 2024-10-12 LAB — BLOOD GAS, VENOUS
Acid-Base Excess: 1.2 mmol/L (ref 0.0–2.0)
Bicarbonate: 28.1 mmol/L — ABNORMAL HIGH (ref 20.0–28.0)
O2 Saturation: 48.5 %
Patient temperature: 37
pCO2, Ven: 52 mmHg (ref 44–60)
pH, Ven: 7.34 (ref 7.25–7.43)
pO2, Ven: <31 mmHg — CL (ref 32–45)

## 2024-10-12 MED ORDER — MAGNESIUM SULFATE 2 GM/50ML IV SOLN
2.0000 g | Freq: Once | INTRAVENOUS | Status: AC
  Administered 2024-10-12: 2 g via INTRAVENOUS
  Filled 2024-10-12: qty 50

## 2024-10-12 MED ORDER — ALBUTEROL SULFATE (2.5 MG/3ML) 0.083% IN NEBU
INHALATION_SOLUTION | RESPIRATORY_TRACT | Status: AC
  Administered 2024-10-12: 15 mg
  Filled 2024-10-12: qty 18

## 2024-10-12 MED ORDER — IPRATROPIUM BROMIDE 0.02 % IN SOLN
1.0000 mg | Freq: Once | RESPIRATORY_TRACT | Status: AC
  Administered 2024-10-12: 1 mg via RESPIRATORY_TRACT
  Filled 2024-10-12: qty 5

## 2024-10-12 MED ORDER — PREDNISONE 20 MG PO TABS: ORAL_TABLET | ORAL | 0 refills | Status: DC

## 2024-10-12 MED ORDER — ALBUTEROL SULFATE HFA 108 (90 BASE) MCG/ACT IN AERS
2.0000 | INHALATION_SPRAY | Freq: Once | RESPIRATORY_TRACT | Status: AC
  Administered 2024-10-12: 2 via RESPIRATORY_TRACT
  Filled 2024-10-12: qty 6.7

## 2024-10-12 MED ORDER — ALBUTEROL SULFATE (2.5 MG/3ML) 0.083% IN NEBU
2.5000 mg | INHALATION_SOLUTION | Freq: Once | RESPIRATORY_TRACT | Status: DC
  Filled 2024-10-12: qty 3

## 2024-10-12 NOTE — Discharge Instructions (Signed)
 As we discussed, you have COPD exacerbation  Please take prednisone  as prescribed  You can use albuterol  2 puffs every 4 hours as needed for shortness of breath  Follow-up with your doctor next week  Return to ER if you have worse shortness of breath or trouble breathing

## 2024-10-12 NOTE — ED Notes (Signed)
 Pt ambulating with no problem, O2 stays 95-96%, no difficulty breathing

## 2024-10-12 NOTE — ED Triage Notes (Signed)
 BIBA from home for [facility] x  several days, no improvement with inhaler and cold medicine. Wheezing in all fields-  0.5 atrovent, 5 mg albuterol , 125 solu medrol  given PTA

## 2024-10-12 NOTE — ED Provider Notes (Signed)
 [place] EMERGENCY DEPARTMENT AT [facility] Provider Note   CSN: [id number] Arrival date & time: 10/12/24  1625     Patient presents with: Wheezing   Samuel Luna is a 63 y.o. male history of COPD, here presenting with shortness of breath and cough.  Patient has been coughing for several weeks and worse for the last 3 to 4 days.  Patient has been prescribed cough medicine with no relief.  Patient was noted to be wheezing per EMS was given 5 mg of albuterol  and 0.5 mg of Atrovent and 125 mg of Solu-Medrol .  Patient states that he was a previous smoker has not smoked recently.  Denies any sick contacts   The history is provided by the patient.       Prior to Admission medications   Medication Sig Start Date End Date Taking? Authorizing Provider  albuterol  (VENTOLIN  HFA) 108 (90 Base) MCG/ACT inhaler Inhale 1-2 puffs into the lungs every 4 (four) hours as needed for wheezing or shortness of breath. 02/28/24   Danton Jon HERO, PA-C  colchicine  0.6 MG tablet Take 1 tablet (0.6 mg total) by mouth 2 (two) times daily. Take 2 tablets by mouth, then take 1 tablet by mouth 2 times daily. Patient not taking: Reported on 08/15/2024 03/24/24   Harris, Abigail, PA-C  diclofenac  Sodium (VOLTAREN ) 1 % GEL Apply 2 g topically 2 (two) times daily as needed. 07/21/24   Vicci Barnie NOVAK, MD  fluticasone -salmeterol (ADVAIR  DISKUS) 250-50 MCG/ACT AEPB Inhale 1 puff into the lungs in the morning and at bedtime. 02/28/24   McClung, Angela M, PA-C  levothyroxine  (SYNTHROID ) 125 MCG tablet Take 1 tablet (125 mcg total) by mouth daily. Discontinue the 100 mcg dose. 09/16/24   Vicci Barnie NOVAK, MD  rosuvastatin  (CRESTOR ) 20 MG tablet Take 1 tablet (20 mg total) by mouth daily. 02/28/24   Danton Jon HERO, PA-C    Allergies: Patient has no known allergies.    Review of Systems  Respiratory:  Positive for wheezing.   All other systems reviewed and are negative.   Updated Vital Signs BP  129/81   Pulse 95   Temp 98.1 F (36.7 C)   Resp 20   SpO2 98%   Physical Exam Vitals and nursing note reviewed.  Constitutional:      Appearance: Normal appearance.  HENT:     Head: Normocephalic.     Nose: Nose normal.     Mouth/Throat:     Mouth: Mucous membranes are dry.  Eyes:     Extraocular Movements: Extraocular movements intact.     Pupils: Pupils are equal, round, and reactive to light.  Cardiovascular:     Rate and Rhythm: Normal rate and regular rhythm.     Pulses: Normal pulses.     Heart sounds: Normal heart sounds.  Pulmonary:     Comments: Wheezing throughout no obvious retractions.  Increased work of breathing Musculoskeletal:        General: Normal range of motion.     Cervical back: Normal range of motion and neck supple.  Skin:    General: Skin is warm.     Capillary Refill: Capillary refill takes less than 2 seconds.  Neurological:     General: No focal deficit present.     Mental Status: He is alert and oriented to person, place, and time.  Psychiatric:        Mood and Affect: Mood normal.        Behavior: Behavior normal.     (  all labs ordered are listed, but only abnormal results are displayed) Labs Reviewed  CBC WITH DIFFERENTIAL/PLATELET - Abnormal; Notable for the following components:      Result Value   Eosinophils Absolute 1.1 (*)    All other components within normal limits  COMPREHENSIVE METABOLIC PANEL WITH GFR - Abnormal; Notable for the following components:   Glucose, Bld 114 (*)    Creatinine, Ser 1.53 (*)    GFR, Estimated 51 (*)    All other components within normal limits  BLOOD GAS, VENOUS - Abnormal; Notable for the following components:   pO2, Ven <31 (*)    Bicarbonate 28.1 (*)    All other components within normal limits  RESP PANEL BY RT-PCR (RSV, FLU A&B, COVID)  [id number]    EKG: EKG Interpretation Date/Time:  Sunday October 12 2024 16:44:33 EST Ventricular Rate:  85 PR Interval:  165 QRS Duration:  102 QT  Interval:  385 QTC Calculation: 458 R Axis:   29  Text Interpretation: Sinus rhythm No significant change since last tracing Confirmed by Patt Alm DEL ([id number]) on 10/12/2024 4:56:36 PM  Radiology: [facility] Chest Port 1 View Result Date: 10/12/2024 EXAM: 1 VIEW(S) XRAY OF THE CHEST 10/12/2024 05:21:00 AM COMPARISON: Chest x-ray 09/09/2021. CLINICAL HISTORY: SOB. FINDINGS: LUNGS AND PLEURA: No focal pulmonary opacity. No pulmonary edema. No pleural effusion. No pneumothorax. HEART AND MEDIASTINUM: No acute abnormality of the cardiac and mediastinal silhouettes. BONES AND SOFT TISSUES: Left axillary surgical clips are again seen. No acute osseous abnormality. IMPRESSION: 1. No acute process. Electronically signed by: Greig Pique MD 10/12/2024 05:25 PM EST RP Workstation: [id number]     Procedures   Medications Ordered in the ED  albuterol  (PROVENTIL ) (2.5 MG/3ML) 0.083% nebulizer solution 2.5 mg (2.5 mg Nebulization Not Given 10/12/24 1649)  albuterol  (VENTOLIN  HFA) 108 (90 Base) MCG/ACT inhaler 2 puff (has no administration in time range)  magnesium sulfate IVPB 2 g 50 mL (0 g Intravenous Stopped 10/12/24 1746)  ipratropium (ATROVENT) nebulizer solution 1 mg (1 mg Nebulization Given 10/12/24 1643)  albuterol  (PROVENTIL ) (2.5 MG/3ML) 0.083% nebulizer solution (15 mg  Given 10/12/24 1643)                                    Medical Decision Making Samuel Luna is a 63 y.o. male here presenting with shortness of breath.  Patient has history of COPD.  Likely COPD exacerbation versus pneumonia.  Plan to get VBG and CBC and CMP and chest x-ray.  Will give continuous neb and reassess.  Patient given Solu-Medrol  by EMS  6:27 PM I reviewed patient's labs and they were unremarkable.  In particular VBG is reassuring.  Chest x-ray did not show any pneumonia.  I reassessed patient and patient is feeling better now.  Minimal wheezing on exam.  Patient is able to ambulate and oxygen remained above 96%.  Will  discharge home with steroids and albuterol    Problems Addressed: COPD exacerbation (HCC): acute illness or injury  Amount and/or Complexity of Data Reviewed Labs: ordered. Decision-making details documented in ED Course. Radiology: ordered and independent interpretation performed. Decision-making details documented in ED Course. ECG/medicine tests: ordered and independent interpretation performed. Decision-making details documented in ED Course.  Risk Prescription drug management.     Final diagnoses:  None    ED Discharge Orders     None          Patt Alm  Hsienta, MD 10/12/24 [id number]

## 2024-10-27 ENCOUNTER — Emergency Department (HOSPITAL_COMMUNITY)

## 2024-10-27 ENCOUNTER — Inpatient Hospital Stay (HOSPITAL_COMMUNITY)
Admission: EM | Admit: 2024-10-27 | Discharge: 2024-10-29 | DRG: 192 | Disposition: A | Discharge status: Home / Self Care | Attending: Internal Medicine | Admitting: Internal Medicine

## 2024-10-27 ENCOUNTER — Encounter (HOSPITAL_COMMUNITY): Payer: Self-pay

## 2024-10-27 ENCOUNTER — Other Ambulatory Visit: Payer: Self-pay

## 2024-10-27 DIAGNOSIS — E876 Hypokalemia: Secondary | ICD-10-CM | POA: Diagnosis present

## 2024-10-27 DIAGNOSIS — J189 Pneumonia, unspecified organism: Secondary | ICD-10-CM

## 2024-10-27 DIAGNOSIS — Z79890 Hormone replacement therapy (postmenopausal): Secondary | ICD-10-CM

## 2024-10-27 DIAGNOSIS — Z79899 Other long term (current) drug therapy: Secondary | ICD-10-CM

## 2024-10-27 DIAGNOSIS — F1090 Alcohol use, unspecified, uncomplicated: Secondary | ICD-10-CM | POA: Diagnosis present

## 2024-10-27 DIAGNOSIS — Z87891 Personal history of nicotine dependence: Secondary | ICD-10-CM

## 2024-10-27 DIAGNOSIS — K219 Gastro-esophageal reflux disease without esophagitis: Secondary | ICD-10-CM | POA: Diagnosis present

## 2024-10-27 DIAGNOSIS — E039 Hypothyroidism, unspecified: Secondary | ICD-10-CM | POA: Diagnosis present

## 2024-10-27 DIAGNOSIS — J9801 Acute bronchospasm: Secondary | ICD-10-CM

## 2024-10-27 DIAGNOSIS — Z7951 Long term (current) use of inhaled steroids: Secondary | ICD-10-CM

## 2024-10-27 DIAGNOSIS — Z8249 Family history of ischemic heart disease and other diseases of the circulatory system: Secondary | ICD-10-CM

## 2024-10-27 DIAGNOSIS — E785 Hyperlipidemia, unspecified: Secondary | ICD-10-CM | POA: Diagnosis present

## 2024-10-27 DIAGNOSIS — Z860100 Personal history of colon polyps, unspecified: Secondary | ICD-10-CM

## 2024-10-27 DIAGNOSIS — J441 Chronic obstructive pulmonary disease with (acute) exacerbation: Principal | ICD-10-CM | POA: Diagnosis present

## 2024-10-27 DIAGNOSIS — F1010 Alcohol abuse, uncomplicated: Secondary | ICD-10-CM | POA: Diagnosis present

## 2024-10-27 DIAGNOSIS — D72829 Elevated white blood cell count, unspecified: Secondary | ICD-10-CM | POA: Diagnosis present

## 2024-10-27 DIAGNOSIS — R0902 Hypoxemia: Secondary | ICD-10-CM

## 2024-10-27 DIAGNOSIS — N1831 Chronic kidney disease, stage 3a: Secondary | ICD-10-CM | POA: Diagnosis present

## 2024-10-27 DIAGNOSIS — N1832 Chronic kidney disease, stage 3b: Secondary | ICD-10-CM | POA: Diagnosis present

## 2024-10-27 DIAGNOSIS — M4120 Other idiopathic scoliosis, site unspecified: Secondary | ICD-10-CM | POA: Diagnosis present

## 2024-10-27 LAB — BASIC METABOLIC PANEL WITH GFR
Anion gap: 14 (ref 5–15)
BUN: 10 mg/dL (ref 8–23)
CO2: 25 mmol/L (ref 22–32)
Calcium: 9.8 mg/dL (ref 8.9–10.3)
Chloride: 103 mmol/L (ref 98–111)
Creatinine, Ser: 1.51 mg/dL — ABNORMAL HIGH (ref 0.61–1.24)
GFR, Estimated: 52 mL/min — ABNORMAL LOW (ref >60)
Glucose, Bld: 112 mg/dL — ABNORMAL HIGH (ref 70–99)
Potassium: 3.3 mmol/L — ABNORMAL LOW (ref 3.5–5.1)
Sodium: 143 mmol/L (ref 135–145)

## 2024-10-27 LAB — CBC
HCT: 45.8 % (ref 39.0–52.0)
Hemoglobin: 14.8 g/dL (ref 13.0–17.0)
MCH: 31 pg (ref 26.0–34.0)
MCHC: 32.3 g/dL (ref 30.0–36.0)
MCV: 95.8 fL (ref 80.0–100.0)
Platelets: 183 K/uL (ref 150–400)
RBC: 4.78 MIL/uL (ref 4.22–5.81)
RDW: 14.6 % (ref 11.5–15.5)
WBC: 11.3 K/uL — ABNORMAL HIGH (ref 4.0–10.5)
nRBC: 0 % (ref 0.0–0.2)

## 2024-10-27 MED ORDER — POTASSIUM CHLORIDE CRYS ER 20 MEQ PO TBCR
40.0000 meq | EXTENDED_RELEASE_TABLET | Freq: Once | ORAL | Status: AC
  Administered 2024-10-27: 40 meq via ORAL
  Filled 2024-10-27: qty 2

## 2024-10-27 MED ORDER — IPRATROPIUM-ALBUTEROL 0.5-2.5 (3) MG/3ML IN SOLN
3.0000 mL | RESPIRATORY_TRACT | Status: DC
  Administered 2024-10-27: 3 mL via RESPIRATORY_TRACT
  Filled 2024-10-27: qty 3

## 2024-10-27 MED ORDER — IOHEXOL 350 MG/ML SOLN
75.0000 mL | Freq: Once | INTRAVENOUS | Status: AC | PRN
  Administered 2024-10-27: 75 mL via INTRAVENOUS

## 2024-10-27 MED ORDER — MAGNESIUM SULFATE 2 GM/50ML IV SOLN
2.0000 g | Freq: Once | INTRAVENOUS | Status: AC
  Administered 2024-10-27: 2 g via INTRAVENOUS
  Filled 2024-10-27: qty 50

## 2024-10-27 MED ORDER — METHYLPREDNISOLONE SODIUM SUCC 125 MG IJ SOLR
125.0000 mg | Freq: Once | INTRAMUSCULAR | Status: AC
  Administered 2024-10-27: 125 mg via INTRAVENOUS
  Filled 2024-10-27: qty 2

## 2024-10-27 MED ORDER — ALBUTEROL SULFATE (2.5 MG/3ML) 0.083% IN NEBU
10.0000 mg/h | INHALATION_SOLUTION | Freq: Once | RESPIRATORY_TRACT | Status: AC
  Administered 2024-10-27: 10 mg/h via RESPIRATORY_TRACT
  Filled 2024-10-27: qty 12

## 2024-10-27 NOTE — ED Provider Notes (Signed)
 63yo male with wheezing, recently finished steroids. No formal diagnosis of asthma, states he was seen here 10/12/24 and told he might have asthma. Former smoker, quit 20 years ago. No prior episodes of wheezing or admissions for difficulty breathing.  Plan for nebs, reassess. May need admission.  Chart review, prior presentations for COPD exacerbation 2024.  Physical Exam  BP 138/71 (BP Location: Right Arm)   Pulse 94   Temp (!) 97.5 F (36.4 C) (Oral)   Resp (!) 22   SpO2 95%   Physical Exam  Procedures  Procedures  ED Course / MDM   Clinical Course as of 10/27/24 2201  Mon Oct 27, 2024  2108 Creatinine(!): 1.51 Similar to baseline [AY]    Clinical Course User Index [AY] Neysa Thersia RAMAN, PA-C   Medical Decision Making Amount and/or Complexity of Data Reviewed Labs: ordered. Decision-making details documented in ED Course. Radiology: ordered.  Risk Prescription drug management.   ***

## 2024-10-27 NOTE — ED Triage Notes (Signed)
 Pt BIB EMS from home with reports of shob since last night that has worsened. Hx of asthma. Pt has been using his inhaler with no relief. Pt has been coughing. Expiratory wheezing all over. 125 solumedrol given, currently on 1st duoneb.

## 2024-10-27 NOTE — ED Provider Notes (Signed)
  EMERGENCY DEPARTMENT AT [facility] Provider Note   CSN: [id number] Arrival date & time: 10/27/24  2014     Patient presents with: Shortness of Breath   Samuel Luna is a 63 y.o. male.  Patient is a 63 year old male with a history of asthma who presents to the ED via EMS for increasing wheezing and chest tightness for the past 2 days.  Notes he has been using his at home inhalers with minimal relief.  He notes he did lose one of his inhalers and he is unsure which one that is.  States he was on steroids approximately 2 weeks ago for a similar episode.  He notes he had improved but started to get worse a couple days ago.  Notes he has had a mild cough but no other sick symptoms.  Denies fevers.  Notes he used to smoke cigarettes but no longer does.  Denies chest pain.  No further complaints.    Shortness of Breath Associated symptoms: cough and wheezing   Associated symptoms: no chest pain and no fever        Prior to Admission medications   Medication Sig Start Date End Date Taking? Authorizing Provider  albuterol  (VENTOLIN  HFA) 108 (90 Base) MCG/ACT inhaler Inhale 1-2 puffs into the lungs every 4 (four) hours as needed for wheezing or shortness of breath. 02/28/24   McClung, Jon HERO, PA-C  colchicine  0.6 MG tablet Take 1 tablet (0.6 mg total) by mouth 2 (two) times daily. Take 2 tablets by mouth, then take 1 tablet by mouth 2 times daily. Patient not taking: Reported on 08/15/2024 03/24/24   Harris, Abigail, PA-C  diclofenac  Sodium (VOLTAREN ) 1 % GEL Apply 2 g topically 2 (two) times daily as needed. 07/21/24   Vicci Barnie NOVAK, MD  fluticasone -salmeterol (ADVAIR  DISKUS) 250-50 MCG/ACT AEPB Inhale 1 puff into the lungs in the morning and at bedtime. 02/28/24   McClung, Angela M, PA-C  levothyroxine  (SYNTHROID ) 125 MCG tablet Take 1 tablet (125 mcg total) by mouth daily. Discontinue the 100 mcg dose. 09/16/24   Vicci Barnie NOVAK, MD  predniSONE  (DELTASONE ) 20  MG tablet Take 60 mg daily x 2 days then 40 mg daily x 2 days then 20 mg daily x 2 days 10/12/24   Patt Alm Macho, MD  rosuvastatin  (CRESTOR ) 20 MG tablet Take 1 tablet (20 mg total) by mouth daily. 02/28/24   [place] Jon HERO, PA-C    Allergies: Patient has no known allergies.    Review of Systems  Constitutional:  Negative for fever.  Respiratory:  Positive for cough, shortness of breath and wheezing.   Cardiovascular:  Negative for chest pain.  All other systems reviewed and are negative.   Updated Vital Signs BP 138/71 (BP Location: Right Arm)   Pulse 94   Temp (!) 97.5 F (36.4 C) (Oral)   Resp (!) 22   SpO2 95%   Physical Exam Constitutional:      Appearance: He is well-developed.  HENT:     Head: Normocephalic and atraumatic.  Cardiovascular:     Rate and Rhythm: Normal rate.  Pulmonary:     Effort: Pulmonary effort is normal. No tachypnea.     Breath sounds: Examination of the right-upper field reveals wheezing. Examination of the left-upper field reveals wheezing. Examination of the right-middle field reveals wheezing. Examination of the right-lower field reveals wheezing. Examination of the left-lower field reveals wheezing. Wheezing present.  Musculoskeletal:     Cervical back: Normal range  of motion.  Skin:    General: Skin is warm and dry.  Neurological:     Mental Status: He is alert and oriented to person, place, and time.  Psychiatric:        Mood and Affect: Mood normal.        Behavior: Behavior normal.     (all labs ordered are listed, but only abnormal results are displayed) Labs Reviewed  BASIC METABOLIC PANEL WITH GFR - Abnormal; Notable for the following components:      Result Value   Potassium 3.3 (*)    Glucose, Bld 112 (*)    Creatinine, Ser 1.51 (*)    GFR, Estimated 52 (*)    All other components within normal limits  CBC - Abnormal; Notable for the following components:   WBC 11.3 (*)    All other components within normal limits     EKG: None  Radiology: [facility] Chest Port 1 View Result Date: 10/27/2024 CLINICAL DATA:  Short of breath, asthma, wheezing EXAM: PORTABLE CHEST 1 VIEW COMPARISON:  10/12/2024 FINDINGS: 2 frontal views of the chest demonstrate a stable cardiac silhouette. No acute airspace disease, effusion, or pneumothorax. No acute bony abnormalities. IMPRESSION: 1. Stable chest, no acute process. Electronically Signed   By: Ozell Daring M.D.   On: 10/27/2024 20:53      Medications Ordered in the ED  ipratropium-albuterol  (DUONEB) 0.5-2.5 (3) MG/3ML nebulizer solution 3 mL (3 mLs Nebulization New Bag/Given 10/27/24 2102)  albuterol  (PROVENTIL ) (2.5 MG/3ML) 0.083% nebulizer solution (has no administration in time range)  magnesium  sulfate IVPB 2 g 50 mL (2 g Intravenous New Bag/Given 10/27/24 2211)  methylPREDNISolone  sodium succinate (SOLU-MEDROL ) 125 mg/2 mL injection 125 mg (125 mg Intravenous Given 10/27/24 2102)  potassium chloride  SA (KLOR-CON  M) CR tablet 40 mEq (40 mEq Oral Given 10/27/24 2207)    Clinical Course as of 10/27/24 2222  Mon Oct 27, 2024  2108 Creatinine(!): 1.51 Similar to baseline [AY]    Clinical Course User Index [AY] Neysa Thersia RAMAN, PA-C                                Medical Decision Making Amount and/or Complexity of Data Reviewed Labs: ordered. Decision-making details documented in ED Course. Radiology: ordered.  Risk Prescription drug management.     Patient is a 63 year old male with a history of asthma who presents to the ED for increasing wheezing for the past 2 days.  Please see HPI above.  On exam patient is alert with diffuse wheezing.  No acute respiratory distress.  Vital signs stable.  Chest x-ray reviewed negative for acute process.  Mild hypokalemia 3.3.  Creatinine similar to baseline.  Patient given potassium, Solu-Medrol , magnesium , and DuoNeb treatment but continues to have diffuse wheezing.  Will give continuous albuterol  treatment and  reevaluate. Patient signed out to Leita Chancy, PA-C at shift change.   Final diagnoses:  None    ED Discharge Orders     None          Neysa Thersia RAMAN DEVONNA 10/27/24 2222    Dreama Longs, MD 10/28/24 1221

## 2024-10-28 DIAGNOSIS — F1090 Alcohol use, unspecified, uncomplicated: Secondary | ICD-10-CM

## 2024-10-28 DIAGNOSIS — J441 Chronic obstructive pulmonary disease with (acute) exacerbation: Secondary | ICD-10-CM | POA: Diagnosis present

## 2024-10-28 DIAGNOSIS — N1831 Chronic kidney disease, stage 3a: Secondary | ICD-10-CM | POA: Diagnosis present

## 2024-10-28 DIAGNOSIS — E876 Hypokalemia: Secondary | ICD-10-CM | POA: Insufficient documentation

## 2024-10-28 LAB — RESPIRATORY PANEL BY PCR

## 2024-10-28 LAB — BASIC METABOLIC PANEL WITH GFR
Anion gap: 13 (ref 5–15)
BUN: 12 mg/dL (ref 8–23)
CO2: 23 mmol/L (ref 22–32)
Calcium: 9.8 mg/dL (ref 8.9–10.3)
Chloride: 103 mmol/L (ref 98–111)
Creatinine, Ser: 1.27 mg/dL — ABNORMAL HIGH (ref 0.61–1.24)
GFR, Estimated: >60 mL/min (ref >60)
Glucose, Bld: 145 mg/dL — ABNORMAL HIGH (ref 70–99)
Potassium: 4.5 mmol/L (ref 3.5–5.1)
Sodium: 139 mmol/L (ref 135–145)

## 2024-10-28 LAB — EXPECTORATED SPUTUM ASSESSMENT W GRAM STAIN, RFLX TO RESP C

## 2024-10-28 LAB — HIV ANTIBODY (ROUTINE TESTING W REFLEX): HIV Screen 4th Generation wRfx: NONREACTIVE

## 2024-10-28 MED ORDER — PANTOPRAZOLE SODIUM 40 MG PO TBEC
40.0000 mg | DELAYED_RELEASE_TABLET | Freq: Every day | ORAL | Status: DC
  Administered 2024-10-28 – 2024-10-29 (×2): 40 mg via ORAL
  Filled 2024-10-28 (×2): qty 1

## 2024-10-28 MED ORDER — GUAIFENESIN ER 600 MG PO TB12
600.0000 mg | ORAL_TABLET | Freq: Two times a day (BID) | ORAL | Status: DC
  Administered 2024-10-28 – 2024-10-29 (×3): 600 mg via ORAL
  Filled 2024-10-28 (×3): qty 1

## 2024-10-28 MED ORDER — ARFORMOTEROL TARTRATE 15 MCG/2ML IN NEBU
15.0000 ug | INHALATION_SOLUTION | Freq: Two times a day (BID) | RESPIRATORY_TRACT | Status: DC
  Administered 2024-10-28 – 2024-10-29 (×3): 15 ug via RESPIRATORY_TRACT
  Filled 2024-10-28 (×3): qty 2

## 2024-10-28 MED ORDER — SODIUM CHLORIDE 0.9 % IV SOLN
500.0000 mg | INTRAVENOUS | Status: DC
  Administered 2024-10-29: 500 mg via INTRAVENOUS
  Filled 2024-10-28: qty 5

## 2024-10-28 MED ORDER — LEVOTHYROXINE SODIUM 125 MCG PO TABS
125.0000 ug | ORAL_TABLET | Freq: Every day | ORAL | Status: DC
  Administered 2024-10-29: 125 ug via ORAL
  Filled 2024-10-28: qty 1

## 2024-10-28 MED ORDER — LORAZEPAM 1 MG PO TABS: 1.0000 mg | ORAL_TABLET | ORAL | Status: DC | PRN

## 2024-10-28 MED ORDER — ENOXAPARIN SODIUM 40 MG/0.4ML IJ SOSY
40.0000 mg | PREFILLED_SYRINGE | INTRAMUSCULAR | Status: DC
  Administered 2024-10-28 – 2024-10-29 (×2): 40 mg via SUBCUTANEOUS
  Filled 2024-10-28 (×2): qty 0.4

## 2024-10-28 MED ORDER — ALBUTEROL SULFATE (2.5 MG/3ML) 0.083% IN NEBU
2.5000 mg | INHALATION_SOLUTION | RESPIRATORY_TRACT | Status: DC | PRN
Start: 2024-10-28 — End: 2024-10-28

## 2024-10-28 MED ORDER — LORAZEPAM 2 MG/ML IJ SOLN: 1.0000 mg | INTRAMUSCULAR | Status: DC | PRN

## 2024-10-28 MED ORDER — ADULT MULTIVITAMIN W/MINERALS CH
1.0000 | ORAL_TABLET | Freq: Every day | ORAL | Status: DC
  Administered 2024-10-28 – 2024-10-29 (×2): 1 via ORAL
  Filled 2024-10-28 (×2): qty 1

## 2024-10-28 MED ORDER — IPRATROPIUM-ALBUTEROL 0.5-2.5 (3) MG/3ML IN SOLN: 3.0000 mL | RESPIRATORY_TRACT | Status: DC | PRN

## 2024-10-28 MED ORDER — SODIUM CHLORIDE 0.9 % IV SOLN
1.0000 g | Freq: Once | INTRAVENOUS | Status: AC
  Administered 2024-10-28: 1 g via INTRAVENOUS
  Filled 2024-10-28: qty 10

## 2024-10-28 MED ORDER — AZITHROMYCIN 250 MG PO TABS
500.0000 mg | ORAL_TABLET | Freq: Once | ORAL | Status: AC
  Administered 2024-10-28: 500 mg via ORAL
  Filled 2024-10-28: qty 2

## 2024-10-28 MED ORDER — THIAMINE HCL 100 MG/ML IJ SOLN
100.0000 mg | Freq: Every day | INTRAMUSCULAR | Status: DC
  Filled 2024-10-28: qty 2

## 2024-10-28 MED ORDER — SODIUM CHLORIDE 0.9 % IV SOLN
1.0000 g | INTRAVENOUS | Status: DC
  Administered 2024-10-29: 1 g via INTRAVENOUS
  Filled 2024-10-28: qty 10

## 2024-10-28 MED ORDER — THIAMINE MONONITRATE 100 MG PO TABS
100.0000 mg | ORAL_TABLET | Freq: Every day | ORAL | Status: DC
  Administered 2024-10-28 – 2024-10-29 (×2): 100 mg via ORAL
  Filled 2024-10-28 (×2): qty 1

## 2024-10-28 MED ORDER — BUDESONIDE 0.25 MG/2ML IN SUSP
0.2500 mg | Freq: Two times a day (BID) | RESPIRATORY_TRACT | Status: DC
  Administered 2024-10-28 – 2024-10-29 (×3): 0.25 mg via RESPIRATORY_TRACT
  Filled 2024-10-28 (×3): qty 2

## 2024-10-28 MED ORDER — METHYLPREDNISOLONE SODIUM SUCC 40 MG IJ SOLR
40.0000 mg | INTRAMUSCULAR | Status: DC
  Administered 2024-10-28: 40 mg via INTRAVENOUS
  Filled 2024-10-28: qty 1

## 2024-10-28 MED ORDER — ROSUVASTATIN CALCIUM 10 MG PO TABS
20.0000 mg | ORAL_TABLET | Freq: Every day | ORAL | Status: DC
  Administered 2024-10-28 – 2024-10-29 (×2): 20 mg via ORAL
  Filled 2024-10-28: qty 2
  Filled 2024-10-28: qty 1

## 2024-10-28 MED ORDER — FOLIC ACID 1 MG PO TABS
1.0000 mg | ORAL_TABLET | Freq: Every day | ORAL | Status: DC
  Administered 2024-10-28 – 2024-10-29 (×2): 1 mg via ORAL
  Filled 2024-10-28 (×2): qty 1

## 2024-10-28 NOTE — Plan of Care (Signed)

## 2024-10-28 NOTE — ED Notes (Signed)
 Abx administered before blood culture order was placed and collected. PA aware.

## 2024-10-28 NOTE — Evaluation (Addendum)
 Occupational Therapy Evaluation Patient Details Name: Samuel Luna MRN: [id number] DOB: Jan 01, 1961 Today's Date: 10/28/2024   History of Present Illness   63 y.o. year old male Presents with  Samuel Luna long ED  with a 2-day history of dyspnea, nonproductive cough, and now wheezing, COPD exacerbation. PMH of hypothyroidism, hyperlipidemia, stage IIIb chronic kidney disease, and alcohol use disorder.     Clinical Impressions Pt c/o SOB, coughing, reports coughing up green sputum. Pt lives at home with wife, PLOF independent, no AD, rides bike to grocery store with trailer behind it for groceries. Pt currently able to complete tasks independently, limited due to SOB and coughing, O2 88-91% on RA resting in bed, dropped to 86% with minimal OOB activities, RN informed and Pt placed on O2 supplementation. Pt at this time has no further acute OT needs, mobility to follow for activity tolerance. Pt would benefit from pulmonary rehab follow up.     If plan is discharge home, recommend the following:   Assistance with cooking/housework     Functional Status Assessment   Patient has had a recent decline in their functional status and demonstrates the ability to make significant improvements in function in a reasonable and predictable amount of time.     Equipment Recommendations   None recommended by OT     Recommendations for Other Services         Precautions/Restrictions   Precautions Precautions: None Recall of Precautions/Restrictions: Intact Restrictions Weight Bearing Restrictions Per Provider Order: No     Mobility Bed Mobility Overal bed mobility: Independent                  Transfers Overall transfer level: Independent                        Balance Overall balance assessment: Independent                                         ADL either performed or assessed with clinical judgement   ADL Overall ADL's : Independent                                              Vision Baseline Vision/History: 1 Wears glasses Ability to See in Adequate Light: 0 Adequate Patient Visual Report: No change from baseline       Perception         Praxis         Pertinent Vitals/Pain Pain Assessment Pain Assessment: 0-10 Pain Score: 3  Pain Location: sore throat Pain Descriptors / Indicators: Aching, Constant Pain Intervention(s): Monitored during session     Extremity/Trunk Assessment Upper Extremity Assessment Upper Extremity Assessment: Overall WFL for tasks assessed   Lower Extremity Assessment Lower Extremity Assessment: Defer to PT evaluation       Communication Communication Communication: No apparent difficulties   Cognition Arousal: Alert Behavior During Therapy: WFL for tasks assessed/performed Cognition: Cognition impaired   Orientation impairments: Time         OT - Cognition Comments: grossly A/O, not sure of what year or day it is, knows it's November                 Following commands: Intact  Cueing  General Comments   Cueing Techniques: Verbal cues  Pt O2 ranging around 88% at rest, 86% with minimal OOB activities, coughing up green sputum   Exercises     Shoulder Instructions      Home Living Family/patient expects to be discharged to:: Private residence Living Arrangements: Spouse/significant other Available Help at Discharge: Family;Available 24 hours/day Type of Home: House Home Access: Ramped entrance     Home Layout: One level     Bathroom Shower/Tub: Tub/shower unit         Home Equipment: Rollator (4 wheels)   Additional Comments: Pt lives with wife who is available 24/7      Prior Functioning/Environment Prior Level of Function : Independent/Modified Independent                    OT Problem List: Decreased activity tolerance;Cardiopulmonary status limiting activity   OT Treatment/Interventions:         OT Goals(Current goals can be found in the care plan section)   Acute Rehab OT Goals Patient Stated Goal: to improve activity tolerance OT Goal Formulation: With patient Time For Goal Achievement: 11/11/24 Potential to Achieve Goals: Good   OT Frequency:       Co-evaluation              AM-PAC OT 6 Clicks Daily Activity     Outcome Measure Help from another person eating meals?: None Help from another person taking care of personal grooming?: None Help from another person toileting, which includes using toliet, bedpan, or urinal?: None Help from another person bathing (including washing, rinsing, drying)?: None Help from another person to put on and taking off regular upper body clothing?: None Help from another person to put on and taking off regular lower body clothing?: None 6 Click Score: 24   End of Session Nurse Communication: Mobility status;Other (comment) (O2 needs)  Activity Tolerance: Patient tolerated treatment well Patient left: in bed;with call bell/phone within reach;with family/visitor present  OT Visit Diagnosis: Other (comment) (decreased activity tolerance)                Time: 8693-8673 OT Time Calculation (min): 20 min Charges:  OT General Charges $OT Visit: 1 Visit OT Evaluation $OT Eval Low Complexity: 1 Low  [address] OTR/L   Elouise JONELLE Bott 10/28/2024, 2:54 PM

## 2024-10-28 NOTE — Progress Notes (Signed)
 Carryover: copd exacerbation with concern for cap

## 2024-10-28 NOTE — Progress Notes (Signed)
 PT Cancellation Note  Patient Details Name: Samuel Luna MRN: [id number] DOB: Jul 08, 1961   Cancelled Treatment:    Reason Eval/Treat Not Completed: Other (comment) Per OT & nursing staff pt is mobilizing independently. No acute PT needs at this time. PT to complete current order, please re-consult if new needs arise.  Richerd Pinal, PT, DPT 10/28/24, 2:40 PM    [name] 10/28/2024, 2:40 PM

## 2024-10-28 NOTE — H&P (Addendum)
 History and Physical    Samuel Luna FMW:[id number] DOB: 06/23/1961 DOA: 10/27/2024  DOS: the patient was seen and examined on 10/27/2024  PCP: Vicci Barnie NOVAK, MD   Patient coming from: Home  I have personally briefly reviewed patient's old medical records in [facility] and CareEverywhere  HPI:   Samuel Luna is a 63 y.o. year old male with past medical history of hypothyroidism, hyperlipidemia, stage IIIb chronic kidney disease, and alcohol use disorder.  Presents with  [facility] ED  with a 2-day history of dyspnea, nonproductive cough, and now wheezing.  He denies fever, recent illness, or sick contacts.  Of note he was previously seen in the ED on 10/12/24 and per patient was told he might have asthma and prescribed steroids.  Endorses a prior history of smoking but quit 20 years ago.  Per chart review he did have a similar presentation in September 2024 thought to be COPD exacerbation at that time.  ED Course: On arrival to [facility] ED patient was noted to be afebrile temp 36.4 C, BP 138/71, HR 95, RR 20, SpO2 95% on room air.  CTA PE obtained and negative for PE, does show enlarged pulmonary trunk as well as small airspace disease with surrounding ground glass density in the right upper lobe.  Labs notable for potassium 3.3, creatinine 1.51, and leukocytosis WBC 11.3.  He was given continuous albuterol , azithromycin , Rocephin , IV magnesium , Solu-Medrol , and 40 mEq p.o. K. [facility] contacted for admission.  Review of Systems: As mentioned in the history of present illness. All other systems reviewed and are negative.   Past Medical History:  Diagnosis Date   Arthritis    knees   Brain injury (HCC)    Chronic kidney disease    mild   GERD (gastroesophageal reflux disease)    Scoliosis    Thyroid  disease    hypothyroidism    Past Surgical History:  Procedure Laterality Date   COLONOSCOPY     LYMPHADENECTOMY Left 12/04/2014   2 lymph nodes left arm    POLYPECTOMY       reports that he has quit smoking. He has quit using smokeless tobacco. He reports current alcohol use. He reports that he does not use drugs.  No Known Allergies  Family History  Problem Relation Age of Onset   Congestive Heart Failure Mother    Heart attack Maternal Grandmother    Heart attack Maternal Grandfather    Colon cancer Neg Hx    Esophageal cancer Neg Hx    Liver cancer Neg Hx    Pancreatic cancer Neg Hx    Rectal cancer Neg Hx    Stomach cancer Neg Hx    Colon polyps Neg Hx    Crohn's disease Neg Hx    Ulcerative colitis Neg Hx     Prior to Admission medications   Medication Sig Start Date End Date Taking? Authorizing Provider  albuterol  (VENTOLIN  HFA) 108 (90 Base) MCG/ACT inhaler Inhale 1-2 puffs into the lungs every 4 (four) hours as needed for wheezing or shortness of breath. 02/28/24  Yes McClung, Angela M, PA-C  fluticasone -salmeterol (ADVAIR  DISKUS) 250-50 MCG/ACT AEPB Inhale 1 puff into the lungs in the morning and at bedtime. Patient taking differently: Inhale 1 puff into the lungs 2 (two) times daily as needed. 02/28/24  Yes McClung, Angela M, PA-C  levothyroxine  (SYNTHROID ) 125 MCG tablet Take 1 tablet (125 mcg total) by mouth daily. Discontinue the 100 mcg dose. 09/16/24  Yes Vicci,  Barnie NOVAK, MD  pantoprazole  (PROTONIX ) 40 MG tablet Take 40 mg by mouth daily as needed.   Yes [provider]  rosuvastatin  (CRESTOR ) 20 MG tablet Take 1 tablet (20 mg total) by mouth daily. 02/28/24  Yes Danton Jon HERO, PA-C  colchicine  0.6 MG tablet Take 1 tablet (0.6 mg total) by mouth 2 (two) times daily. Take 2 tablets by mouth, then take 1 tablet by mouth 2 times daily. Patient not taking: No sig reported 03/24/24   Arloa Chroman, PA-C  diclofenac  Sodium (VOLTAREN ) 1 % GEL Apply 2 g topically 2 (two) times daily as needed. Patient not taking: Reported on 10/28/2024 07/21/24   Vicci Barnie NOVAK, MD  predniSONE  (DELTASONE ) 20 MG tablet Take 60  mg daily x 2 days then 40 mg daily x 2 days then 20 mg daily x 2 days Patient not taking: Reported on 10/28/2024 10/12/24   Patt Alm Macho, MD    Physical Exam: Vitals:   10/28/24 0100 10/28/24 0115 10/28/24 0430 10/28/24 0520  BP: (!) 120/102 124/66 102/69   Pulse: 99 (!) 104 100   Resp: 18 (!) 24 (!) 21   Temp:    97.7 F (36.5 C)  TempSrc:      SpO2: 91% 93% 91%     Physical Exam Vitals reviewed.  HENT:     Head: Normocephalic.     Mouth/Throat:     Comments: Edentulous Eyes:     Pupils: Pupils are equal, round, and reactive to light.  Cardiovascular:     Rate and Rhythm: Normal rate and regular rhythm.  Pulmonary:     Effort: Pulmonary effort is normal.     Breath sounds: Wheezing present.  Abdominal:     General: Bowel sounds are normal. There is distension.     Tenderness: There is no abdominal tenderness.  Skin:    General: Skin is warm and dry.     Capillary Refill: Capillary refill takes less than 2 seconds.  Neurological:     General: No focal deficit present.     Mental Status: He is alert and oriented to person, place, and time.  Psychiatric:        Mood and Affect: Mood normal.        Behavior: Behavior normal.      Labs on Admission: I have personally reviewed following labs and imaging studies  CBC: Recent Labs  Lab 10/27/24 2041  WBC 11.3*  HGB 14.8  HCT 45.8  MCV 95.8  PLT 183   Basic Metabolic Panel: Recent Labs  Lab 10/27/24 2041  NA 143  K 3.3*  CL 103  CO2 25  GLUCOSE 112*  BUN 10  CREATININE 1.51*  CALCIUM  9.8   GFR: CrCl cannot be calculated (Unknown ideal weight.). Liver Function Tests: No results for input(s): AST, ALT, ALKPHOS, BILITOT, PROT, ALBUMIN in the last 168 hours. No results for input(s): LIPASE, AMYLASE in the last 168 hours. No results for input(s): AMMONIA in the last 168 hours. Coagulation Profile: No results for input(s): INR, PROTIME in the last 168 hours. Cardiac  Enzymes: No results for input(s): CKTOTAL, CKMB, CKMBINDEX, TROPONINI, TROPONINIHS in the last 168 hours. BNP (last 3 results) No results for input(s): BNP in the last 8760 hours. HbA1C: No results for input(s): HGBA1C in the last 72 hours. CBG: No results for input(s): GLUCAP in the last 168 hours. Lipid Profile: No results for input(s): CHOL, HDL, LDLCALC, TRIG, CHOLHDL, LDLDIRECT in the last 72 hours. Thyroid  Function Tests:  No results for input(s): TSH, T4TOTAL, FREET4, T3FREE, THYROIDAB in the last 72 hours. Anemia Panel: No results for input(s): VITAMINB12, FOLATE, FERRITIN, TIBC, IRON, RETICCTPCT in the last 72 hours. Urine analysis:    Component Value Date/Time   COLORURINE lt. yellow 05/12/2008 0912   APPEARANCEUR Clear 05/12/2008 0912   LABSPEC 1.020 07/30/2020 1215   PHURINE 7.0 07/30/2020 1215   GLUCOSEU NEGATIVE 07/30/2020 1215   HGBUR NEGATIVE 07/30/2020 1215   HGBUR negative 05/12/2008 0912   BILIRUBINUR NEGATIVE 07/30/2020 1215   KETONESUR NEGATIVE 07/30/2020 1215   PROTEINUR NEGATIVE 07/30/2020 1215   UROBILINOGEN 0.2 07/30/2020 1215   NITRITE NEGATIVE 07/30/2020 1215   LEUKOCYTESUR NEGATIVE 07/30/2020 1215    Radiological Exams on Admission: I have personally reviewed images CT Angio Chest PE W/Cm &/Or Wo Cm Result Date: 10/28/2024 EXAM: CTA CHEST AORTA 10/27/2024 11:59:07 PM TECHNIQUE: CTA of the chest was performed after the administration of 75 mL of iohexol  (OMNIPAQUE ) 350 MG/ML injection. Multiplanar reformatted images are provided for review. MIP images are provided for review. Automated exposure control, iterative reconstruction, and/or weight based adjustment of the mA/kV was utilized to reduce the radiation dose to as low as reasonably achievable. COMPARISON: Chest CT 03/15/2005; chest x-ray 10/27/2024. CLINICAL HISTORY: Pulmonary embolism (PE) suspected, high prob. FINDINGS: AORTA: Mild aortic  atherosclerosis. 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 No thoracic aortic dissection. MEDIASTINUM: Enlarged-appearing pulmonary trunk up to 3.5 cm. Suboptimal opacification of the pulmonary arterial system. The heart and pericardium demonstrate no acute abnormality. LYMPH NODES: No mediastinal, hilar or axillary lymphadenopathy. LUNGS AND PLEURA: Focal nodular airspace disease with mild ground-glass opacity in the right upper lobe, series 12 image 35. Negative for pleural effusion or pneumothorax. UPPER ABDOMEN: Limited images of the upper abdomen are unremarkable. SOFT TISSUES AND BONES: Surgical clips within the left axilla. No acute bone abnormality. IMPRESSION: 1. No evidence of pulmonary embolism, given suboptimal opacification of the pulmonary arteries. 2. 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 No dissection. 3. Enlarged pulmonary trunk measuring up to 3.5 cm, which can be seen with pulmonary arterial hypertension. 4. Small focus of nodular airspace disease and surrounding ground-glass density in the right upper lobe, suspect infectious or inflammatory, but recommend short-term follow-up CT in 3 to 6 months to ensure resolution and exclude nodule. Electronically signed by: Luke Bun MD 10/28/2024 12:08 AM EST RP Workstation: [id number]   DG Chest Port 1 View Result Date: 10/27/2024 CLINICAL DATA:  Short of breath, asthma, wheezing EXAM: PORTABLE CHEST 1 VIEW COMPARISON:  10/12/2024 FINDINGS: 2 frontal views of the chest demonstrate a stable cardiac silhouette. No acute airspace disease, effusion, or pneumothorax. No acute bony abnormalities. IMPRESSION: 1. Stable chest, no acute process. Electronically Signed   By: Ozell Daring M.D.   On: 10/27/2024 20:53      Assessment/Plan Principal Problem:   COPD (chronic obstructive pulmonary disease) (HCC)   ##COPD Exacerbation Patient does not have prior diagnosis of COPD -IV  azithromycin  and Rocephin  - IV Solumedrol - Scheduled Pulmicort  and Brovana  - As needed Duonebs - Add on Sputum culture and 20 Pathogen panel - Mucinex  - Supplemental O2 as needed - Pulmonary toilet: Mobilize/incentive spirometry/flutter valve  - Urine studies: Legionella, Strep Antigen  ## Hypokalemia - s/p 40 mEq p.o. K in ED - Replete as needed  #Alcohol use disorder Reports last drink was 2 days ago - Patient counseled on importance of cessation - CIWA protocol with as  needed symptom triggered Ativan   #Hypothyroidism -Continue home levothyroxine   #Hyperlipidemia -Continue home Crestor   #Stage IIIa chronic kidney disease Creatinine 1.51 improved from 1.53 2 weeks ago, previous baseline appears to be 1.35-1.4 -Avoid nephrotoxins, contrast Dyes, Hypotension and Dehydration  -Continue to Monitor and Trend Renal Function carefully, repeat metabolic panel with a.m. labs  VTE prophylaxis:  Lovenox   GI prophylaxis: Protonix  Diet: Heart healthy Access: PIV Lines: None Code Status:  Full Code Telemetry: Yes  Admission status: Inpatient, Telemetry bed Patient is from: Home Anticipated d/c is to: Home Anticipated d/c date is: 1 to 2 days Patient currently: Receiving IV steroids and IV antibiotics, pending symptomatic improvement   Family Communication: Wife Lynder at bedside  Consults called: N/A    Severity of Illness: The appropriate patient status for this patient is INPATIENT. Inpatient status is judged to be reasonable and necessary in order to provide the required intensity of service to ensure the patient's safety. The patient's presenting symptoms, physical exam findings, and initial radiographic and laboratory data in the context of their chronic comorbidities is felt to place them at high risk for further clinical deterioration. Furthermore, it is not anticipated that the patient will be medically stable for discharge from the hospital within 2 midnights of  admission.   * I certify that at the point of admission it is my clinical judgment that the patient will require inpatient hospital care spanning beyond 2 midnights from the point of admission due to high intensity of service, high risk for further deterioration and high frequency of surveillance required.*  To reach the provider On-Call:   7AM- 7PM see care teams to locate the attending and reach out to them via [email or web address] Password: [email or web address] 7PM-7AM contact night-coverage If you still have difficulty reaching the appropriate provider, please page the [facility] (Director on Call) for [facility] on amion for assistance  This document was prepared using Conservation officer, historic buildings and may include unintentional dictation errors.  Rockie Rams FNP-BC, PMHNP-BC Nurse Practitioner [facility]

## 2024-10-29 LAB — CBC
HCT: 43.8 % (ref 39.0–52.0)
Hemoglobin: 14 g/dL (ref 13.0–17.0)
MCH: 30.9 pg (ref 26.0–34.0)
MCHC: 32 g/dL (ref 30.0–36.0)
MCV: 96.7 fL (ref 80.0–100.0)
Platelets: 183 K/uL (ref 150–400)
RBC: 4.53 MIL/uL (ref 4.22–5.81)
RDW: 14.9 % (ref 11.5–15.5)
WBC: 19 K/uL — ABNORMAL HIGH (ref 4.0–10.5)
nRBC: 0 % (ref 0.0–0.2)

## 2024-10-29 LAB — BASIC METABOLIC PANEL WITH GFR
Anion gap: 12 (ref 5–15)
BUN: 19 mg/dL (ref 8–23)
CO2: 22 mmol/L (ref 22–32)
Calcium: 9.5 mg/dL (ref 8.9–10.3)
Chloride: 105 mmol/L (ref 98–111)
Creatinine, Ser: 1.41 mg/dL — ABNORMAL HIGH (ref 0.61–1.24)
GFR, Estimated: 56 mL/min — ABNORMAL LOW (ref >60)
Glucose, Bld: 148 mg/dL — ABNORMAL HIGH (ref 70–99)
Potassium: 4.9 mmol/L (ref 3.5–5.1)
Sodium: 139 mmol/L (ref 135–145)

## 2024-10-29 LAB — STREP PNEUMONIAE URINARY ANTIGEN: Strep Pneumo Urinary Antigen: NEGATIVE

## 2024-10-29 MED ORDER — GUAIFENESIN ER 600 MG PO TB12: 600.0000 mg | ORAL_TABLET | Freq: Two times a day (BID) | ORAL | 0 refills | Status: AC

## 2024-10-29 MED ORDER — FLUTICASONE-SALMETEROL 250-50 MCG/ACT IN AEPB: 1.0000 | INHALATION_SPRAY | Freq: Two times a day (BID) | RESPIRATORY_TRACT | Status: DC

## 2024-10-29 MED ORDER — PREDNISONE 20 MG PO TABS: 40.0000 mg | ORAL_TABLET | Freq: Every day | ORAL | 0 refills | Status: AC

## 2024-10-29 MED ORDER — AZITHROMYCIN 500 MG PO TABS: 500.0000 mg | ORAL_TABLET | Freq: Every day | ORAL | 0 refills | Status: AC

## 2024-10-29 NOTE — Discharge Summary (Signed)
 Physician Discharge Summary  Samuel Luna FMW:[id number] DOB: 06/26/1961 DOA: 10/27/2024  PCP: Vicci Barnie NOVAK, MD  Admit date: 10/27/2024 Discharge date: 10/29/2024  Admitted From: Home Disposition: Home  Recommendations for Outpatient Follow-up:  Follow up with PCP in 1-2 weeks Please obtain BMP/CBC in one week Sending referral to pulmonary office for follow-up  Home Health: N/A Equipment/Devices: N/A  Discharge Condition: Stable CODE STATUS: Full code Diet recommendation: Low-salt diet  Discharge summary: 63 year old with hypothyroidism and hyperlipidemia, CKD stage IIIa who was recently in the ER with bronchitis and COPD exacerbation like symptoms treated with oral steroids and discharged on Advair  and albuterol  came back with 2 days of worsening dyspnea, nonproductive cough and wheezing.  In the emergency room initially on 2 L oxygen.  Afebrile.  Chest x-ray and CT scan of the chest negative for PE, enlarged pulmonary trunk, small airspace disease with surrounding ground glass density in the right upper lobe.  Patient was with significant symptoms, given multiple doses of nebulizer and admitted to hospital.  COPD with acute exacerbation: Newly diagnosed depending upon clinical symptoms.  Previous smoker. Patient treated with steroids and bronchodilator therapy.  Asymptomatic today.  On room air and mobilizing around without hypoxemia. Home with Prednisone , 4 more days Azithromycin , 3 more days Albuterol  inhaler as needed Advair  twice daily scheduled, encouraged to use it is scheduled. Pulmonology referral for spirometry and follow-up. Respiratory virus panel negative.  Hypothyroidism, hyperlipidemia, CKD stage IIIa, at about baseline.  Resume home medications.  Stable to discharge home.   Discharge Diagnoses:  Principal Problem:   COPD with acute exacerbation (HCC) Active Problems:   Idiopathic scoliosis and kyphoscoliosis   Hyperlipidemia   Alcohol use  disorder   Hypokalemia   CKD stage 3a, GFR 45-59 ml/min [facility])    Discharge Instructions  Discharge Instructions     Diet - low sodium heart healthy   Complete by: As directed    Increase activity slowly   Complete by: As directed    Pulmonary Visit   Complete by: As directed    COPD   Reason for referral: Other Pulmonary      Allergies as of 10/29/2024   No Known Allergies      Medication List     STOP taking these medications    colchicine  0.6 MG tablet   diclofenac  Sodium 1 % Gel Commonly known as: VOLTAREN        TAKE these medications    albuterol  108 (90 Base) MCG/ACT inhaler Commonly known as: Ventolin  HFA Inhale 1-2 puffs into the lungs every 4 (four) hours as needed for wheezing or shortness of breath.   azithromycin  500 MG tablet Commonly known as: Zithromax  Take 1 tablet (500 mg total) by mouth daily for 3 days.   fluticasone -salmeterol 250-50 MCG/ACT Aepb Commonly known as: Advair  Diskus Inhale 1 puff into the lungs in the morning and at bedtime. What changed:  when to take this reasons to take this   guaiFENesin  600 MG 12 hr tablet Commonly known as: MUCINEX  Take 1 tablet (600 mg total) by mouth 2 (two) times daily for 7 days.   levothyroxine  125 MCG tablet Commonly known as: SYNTHROID  Take 1 tablet (125 mcg total) by mouth daily. Discontinue the 100 mcg dose.   pantoprazole  40 MG tablet Commonly known as: PROTONIX  Take 40 mg by mouth daily as needed.   predniSONE  20 MG tablet Commonly known as: DELTASONE  Take 2 tablets (40 mg total) by mouth daily for 4 days. What changed:  how  much to take how to take this when to take this additional instructions   rosuvastatin  20 MG tablet Commonly known as: CRESTOR  Take 1 tablet (20 mg total) by mouth daily.        No Known Allergies  Consultations: None   Procedures/Studies: CT Angio Chest PE W/Cm &/Or Wo Cm Result Date: 10/28/2024 EXAM: CTA CHEST AORTA 10/27/2024 11:59:07  PM TECHNIQUE: CTA of the chest was performed after the administration of 75 mL of iohexol  (OMNIPAQUE ) 350 MG/ML injection. Multiplanar reformatted images are provided for review. MIP images are provided for review. Automated exposure control, iterative reconstruction, and/or weight based adjustment of the mA/kV was utilized to reduce the radiation dose to as low as reasonably achievable. COMPARISON: Chest CT 03/15/2005; chest x-ray 10/27/2024. CLINICAL HISTORY: Pulmonary embolism (PE) suspected, high prob. FINDINGS: AORTA: Mild aortic atherosclerosis. 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 No thoracic aortic dissection. MEDIASTINUM: Enlarged-appearing pulmonary trunk up to 3.5 cm. Suboptimal opacification of the pulmonary arterial system. The heart and pericardium demonstrate no acute abnormality. LYMPH NODES: No mediastinal, hilar or axillary lymphadenopathy. LUNGS AND PLEURA: Focal nodular airspace disease with mild ground-glass opacity in the right upper lobe, series 12 image 35. Negative for pleural effusion or pneumothorax. UPPER ABDOMEN: Limited images of the upper abdomen are unremarkable. SOFT TISSUES AND BONES: Surgical clips within the left axilla. No acute bone abnormality. IMPRESSION: 1. No evidence of pulmonary embolism, given suboptimal opacification of the pulmonary arteries. 2. 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 No dissection. 3. Enlarged pulmonary trunk measuring up to 3.5 cm, which can be seen with pulmonary arterial hypertension. 4. Small focus of nodular airspace disease and surrounding ground-glass density in the right upper lobe, suspect infectious or inflammatory, but recommend short-term follow-up CT in 3 to 6 months to ensure resolution and exclude nodule. Electronically signed by: Luke Bun MD 10/28/2024 12:08 AM EST RP Workstation: [id number]   DG Chest Port 1 View Result Date: 10/27/2024 CLINICAL DATA:  Short  of breath, asthma, wheezing EXAM: PORTABLE CHEST 1 VIEW COMPARISON:  10/12/2024 FINDINGS: 2 frontal views of the chest demonstrate a stable cardiac silhouette. No acute airspace disease, effusion, or pneumothorax. No acute bony abnormalities. IMPRESSION: 1. Stable chest, no acute process. Electronically Signed   By: Ozell Daring M.D.   On: 10/27/2024 20:53   DG Chest Port 1 View Result Date: 10/12/2024 EXAM: 1 VIEW(S) XRAY OF THE CHEST 10/12/2024 05:21:00 AM COMPARISON: Chest x-ray 09/09/2021. CLINICAL HISTORY: SOB. FINDINGS: LUNGS AND PLEURA: No focal pulmonary opacity. No pulmonary edema. No pleural effusion. No pneumothorax. HEART AND MEDIASTINUM: No acute abnormality of the cardiac and mediastinal silhouettes. BONES AND SOFT TISSUES: Left axillary surgical clips are again seen. No acute osseous abnormality. IMPRESSION: 1. No acute process. Electronically signed by: Greig Pique MD 10/12/2024 05:25 PM EST RP Workstation: [id number]   (Echo, Carotid, EGD, Colonoscopy, ERCP)    Subjective: Patient seen and examined on morning rounds.  Denies any complaints.  Wife at the bedside.  He is walking around on room air and denies any cough, wheezing or shortness of breath.  Apparently patient was not using Advair  every day.  Comfortable with plan to go home.   Discharge Exam: Vitals:   10/29/24 0759 10/29/24 0932  BP:  105/77  Pulse:  73  Resp:    Temp:    SpO2: 94% 91%   Vitals:   10/29/24 0227 10/29/24 0757 10/29/24 0759 10/29/24 0932  BP: 104/71   105/77  Pulse: 73   73  Resp: 18     Temp: 97.6 F (36.4 C)     TempSrc:      SpO2: 91% 94% 94% 91%    General: Pt is alert, awake, not in acute distress Edentulous. Cardiovascular: RRR, S1/S2 +, no rubs, no gallops Respiratory: CTA bilaterally, no wheezing, no rhonchi, on room air Abdominal: Soft, NT, ND, bowel sounds + Extremities: no edema, no cyanosis    The results of significant diagnostics from this hospitalization (including  imaging, microbiology, ancillary and laboratory) are listed below for reference.     Microbiology: Recent Results (from the past 240 hours)  Culture, blood (routine x 2)     Status: None (Preliminary result)   Collection Time: 10/28/24  1:29 AM   Specimen: BLOOD  Result Value Ref Range Status   Specimen Description   Final    BLOOD LEFT ANTECUBITAL Performed at [facility], [address] [place], [place] [address]    Special Requests   Final    BOTTLES DRAWN AEROBIC AND ANAEROBIC Blood Culture adequate volume Performed at [facility], [address], [place], [place] [address]    Culture   Final    NO GROWTH 1 DAY Performed at [facility], [address] [place], [place] [address]    Report Status PENDING  Incomplete  Culture, blood (routine x 2)     Status: None (Preliminary result)   Collection Time: 10/28/24  1:34 AM   Specimen: BLOOD  Result Value Ref Range Status   Specimen Description   Final    BLOOD RIGHT ANTECUBITAL Performed at [facility], [address], [place], [place] [address]    Special Requests   Final    BOTTLES DRAWN AEROBIC AND ANAEROBIC Blood Culture adequate volume Performed at [facility], [address], [place], [place] [address]    Culture   Final    NO GROWTH 1 DAY Performed at [facility], [address], [place], [place] [address]    Report Status PENDING  Incomplete  Expectorated Sputum Assessment w Gram Stain, Rflx to Resp Cult     Status: None   Collection Time: 10/28/24  4:18 PM   Specimen: Expectorated Sputum  Result Value Ref Range Status   Specimen Description EXPSU  Final   Special Requests NONE  Final   Sputum evaluation   Final    THIS SPECIMEN IS ACCEPTABLE FOR SPUTUM CULTURE Performed at [facility], [address], [place], [place] [address]    Report Status 10/28/2024 FINAL  Final  Respiratory (~20 pathogens)  panel by PCR     Status: None   Collection Time: 10/28/24  4:18 PM   Specimen: Nasopharyngeal Swab; Respiratory  Result Value Ref Range Status   Adenovirus NOT DETECTED NOT DETECTED Final   Coronavirus 229E NOT DETECTED NOT DETECTED Final    Comment: (NOTE) The Coronavirus on the Respiratory Panel, DOES NOT test for the novel  Coronavirus (2019 nCoV)    Coronavirus HKU1 NOT DETECTED NOT DETECTED Final   Coronavirus NL63 NOT DETECTED NOT DETECTED Final   Coronavirus OC43 NOT DETECTED NOT DETECTED Final   Metapneumovirus NOT DETECTED NOT DETECTED Final   Rhinovirus / Enterovirus NOT DETECTED NOT DETECTED Final   Influenza A NOT DETECTED NOT DETECTED Final   Influenza B NOT DETECTED NOT DETECTED Final   Parainfluenza Virus 1 NOT DETECTED  NOT DETECTED Final   Parainfluenza Virus 2 NOT DETECTED NOT DETECTED Final   Parainfluenza Virus 3 NOT DETECTED NOT DETECTED Final   Parainfluenza Virus 4 NOT DETECTED NOT DETECTED Final   Respiratory Syncytial Virus NOT DETECTED NOT DETECTED Final   Bordetella pertussis NOT DETECTED NOT DETECTED Final   Bordetella Parapertussis NOT DETECTED NOT DETECTED Final   Chlamydophila pneumoniae NOT DETECTED NOT DETECTED Final   Mycoplasma pneumoniae NOT DETECTED NOT DETECTED Final    Comment: Performed at [facility], [address], [place], [place] [address]  Culture, Respiratory w Gram Stain     Status: None (Preliminary result)   Collection Time: 10/28/24  4:18 PM  Result Value Ref Range Status   Specimen Description   Final    EXPSU Performed at [facility], [address] [place], [place] [address]    Special Requests   Final    NONE Reflexed from [phone] Performed at [facility], [address] [place], [place] [address]    Gram Stain   Final    RARE WBC SEEN RARE GRAM POSITIVE COCCI Performed at [facility], [address], [place], [place] [address]    Culture PENDING  Incomplete    Report Status PENDING  Incomplete     Labs: BNP (last 3 results) No results for input(s): BNP in the last 8760 hours. Basic Metabolic Panel: Recent Labs  Lab 10/27/24 2041 10/28/24 1229 10/29/24 0438  NA 143 139 139  K 3.3* 4.5 4.9  CL 103 103 105  CO2 25 23 22   GLUCOSE 112* 145* 148*  BUN 10 12 19   CREATININE 1.51* 1.27* 1.41*  CALCIUM  9.8 9.8 9.5   Liver Function Tests: No results for input(s): AST, ALT, ALKPHOS, BILITOT, PROT, ALBUMIN in the last 168 hours. No results for input(s): LIPASE, AMYLASE in the last 168 hours. No results for input(s): AMMONIA in the last 168 hours. CBC: Recent Labs  Lab 10/27/24 2041 10/29/24 0438  WBC 11.3* 19.0*  HGB 14.8 14.0  HCT 45.8 43.8  MCV 95.8 96.7  PLT 183 183   Cardiac Enzymes: No results for input(s): CKTOTAL, CKMB, CKMBINDEX, TROPONINI in the last 168 hours. BNP: Invalid input(s): POCBNP CBG: No results for input(s): GLUCAP in the last 168 hours. D-Dimer No results for input(s): DDIMER in the last 72 hours. Hgb A1c No results for input(s): HGBA1C in the last 72 hours. Lipid Profile No results for input(s): CHOL, HDL, LDLCALC, TRIG, CHOLHDL, LDLDIRECT in the last 72 hours. Thyroid  function studies No results for input(s): TSH, T4TOTAL, T3FREE, THYROIDAB in the last 72 hours.  Invalid input(s): FREET3 Anemia work up No results for input(s): VITAMINB12, FOLATE, FERRITIN, TIBC, IRON, RETICCTPCT in the last 72 hours. Urinalysis    Component Value Date/Time   COLORURINE lt. yellow 05/12/2008 0912   APPEARANCEUR Clear 05/12/2008 0912   LABSPEC 1.020 07/30/2020 1215   PHURINE 7.0 07/30/2020 1215   GLUCOSEU NEGATIVE 07/30/2020 1215   HGBUR NEGATIVE 07/30/2020 1215   HGBUR negative 05/12/2008 0912   BILIRUBINUR NEGATIVE 07/30/2020 1215   KETONESUR NEGATIVE 07/30/2020 1215   PROTEINUR NEGATIVE 07/30/2020 1215   UROBILINOGEN 0.2 07/30/2020 1215    NITRITE NEGATIVE 07/30/2020 1215   LEUKOCYTESUR NEGATIVE 07/30/2020 1215   Sepsis Labs Recent Labs  Lab 10/27/24 2041 10/29/24 0438  WBC 11.3* 19.0*   Microbiology Recent Results (from the past 240 hours)  Culture, blood (routine x 2)     Status: None (Preliminary  result)   Collection Time: 10/28/24  1:29 AM   Specimen: BLOOD  Result Value Ref Range Status   Specimen Description   Final    BLOOD LEFT ANTECUBITAL Performed at [facility], [address], [place], [place] [address]    Special Requests   Final    BOTTLES DRAWN AEROBIC AND ANAEROBIC Blood Culture adequate volume Performed at [facility], [address], [place], [place] [address]    Culture   Final    NO GROWTH 1 DAY Performed at [facility], [address], [place], [place] [address]    Report Status PENDING  Incomplete  Culture, blood (routine x 2)     Status: None (Preliminary result)   Collection Time: 10/28/24  1:34 AM   Specimen: BLOOD  Result Value Ref Range Status   Specimen Description   Final    BLOOD RIGHT ANTECUBITAL Performed at [facility], [address] [place], [place] [address]    Special Requests   Final    BOTTLES DRAWN AEROBIC AND ANAEROBIC Blood Culture adequate volume Performed at [facility], [address] [place], [place] [address]    Culture   Final    NO GROWTH 1 DAY Performed at [facility], [address] [place], [place] [address]    Report Status PENDING  Incomplete  Expectorated Sputum Assessment w Gram Stain, Rflx to Resp Cult     Status: None   Collection Time: 10/28/24  4:18 PM   Specimen: Expectorated Sputum  Result Value Ref Range Status   Specimen Description EXPSU  Final   Special Requests NONE  Final   Sputum evaluation   Final    THIS SPECIMEN IS ACCEPTABLE FOR SPUTUM CULTURE Performed at [facility], [address] [place], [place]  [address]    Report Status 10/28/2024 FINAL  Final  Respiratory (~20 pathogens) panel by PCR     Status: None   Collection Time: 10/28/24  4:18 PM   Specimen: Nasopharyngeal Swab; Respiratory  Result Value Ref Range Status   Adenovirus NOT DETECTED NOT DETECTED Final   Coronavirus 229E NOT DETECTED NOT DETECTED Final    Comment: (NOTE) The Coronavirus on the Respiratory Panel, DOES NOT test for the novel  Coronavirus (2019 nCoV)    Coronavirus HKU1 NOT DETECTED NOT DETECTED Final   Coronavirus NL63 NOT DETECTED NOT DETECTED Final   Coronavirus OC43 NOT DETECTED NOT DETECTED Final   Metapneumovirus NOT DETECTED NOT DETECTED Final   Rhinovirus / Enterovirus NOT DETECTED NOT DETECTED Final   Influenza A NOT DETECTED NOT DETECTED Final   Influenza B NOT DETECTED NOT DETECTED Final   Parainfluenza Virus 1 NOT DETECTED NOT DETECTED Final   Parainfluenza Virus 2 NOT DETECTED NOT DETECTED Final   Parainfluenza Virus 3 NOT DETECTED NOT DETECTED Final   Parainfluenza Virus 4 NOT DETECTED NOT DETECTED Final   Respiratory Syncytial Virus NOT DETECTED NOT DETECTED Final   Bordetella pertussis NOT DETECTED NOT DETECTED Final   Bordetella Parapertussis NOT DETECTED NOT DETECTED Final   Chlamydophila pneumoniae NOT DETECTED NOT DETECTED Final   Mycoplasma pneumoniae NOT DETECTED NOT DETECTED Final    Comment: Performed at [facility], [address] [place], [place] [address]  Culture, Respiratory w Gram Stain     Status: None (Preliminary result)   Collection Time: 10/28/24  4:18 PM  Result Value Ref Range Status   Specimen Description  Final    EXPSU Performed at [facility], [address], [place], [place] [address]    Special Requests   Final    NONE Reflexed from [phone] Performed at [facility], [address], [place], [place] [address]    Gram Stain   Final    RARE WBC SEEN RARE GRAM POSITIVE COCCI Performed at [facility],  [address] [place], [place] [address]    Culture PENDING  Incomplete   Report Status PENDING  Incomplete     Time coordinating discharge: 35 minutes  SIGNED:   Renato Applebaum, MD  [facility] 10/29/2024, 10:28 AM

## 2024-10-29 NOTE — TOC Initial Note (Signed)
 Transition of Care [facility] [place]) - Initial/Assessment Note    Patient Details  Name: Samuel Luna MRN: [id number] Date of Birth: [birth year]  [facility] of [facility]) CM/SW Contact:    Doneta Glenys DASEN, RN Phone Number: 10/29/2024, 11:02 AM  Clinical Narrative:                 Presented for SOB. PTA lives in a house with spouse;Verified PCP/insurance;denies DME,HH,oxygen and SDOH needs;Spouse Lynder will transport home. Patient politely refused substance abuse resources. Inpatient care manager sign off.  Expected Discharge Plan: Home/Self Care Barriers to Discharge: No Barriers Identified   Patient Goals and CMS Choice Patient states their goals for this hospitalization and ongoing recovery are:: Home          Expected Discharge Plan and Services   Discharge Planning Services: CM Consult   Living arrangements for the past 2 months: Single Family Home Expected Discharge Date: 10/29/24               DME Arranged: N/A DME Agency: NA       HH Arranged: NA HH Agency: NA        Pr-ior Living Arrangements/Services -Living arrangements for the past 2 months: Single Family Home Lives with:: Spouse Patient language and need for interpreter reviewed:: Yes Do you feel safe going back to the place where you live?: Yes        Care giver support system in place?: Yes (comment) Current home services:  (NA) Criminal Activity/Legal Involvement Pertinent to Current Situation/Hospitalization: No - Comment as needed  Activities of Daily Living   ADL Screening (condition at time of admission) Independently performs ADLs?: Yes (appropriate for developmental age) Is the patient deaf or have difficulty hearing?: No Does the patient have difficulty seeing, even when wearing glasses/contacts?: No Does the patient have difficulty concentrating, remembering, or making decisions?: No  Permission Sought/Granted Permission sought to share information with : Case Manager Permission granted to  share information with : Yes, Verbal Permission Granted  Share Information with NAME: Jusiah, Aguayo (Spouse)  [phone]           Emotional Assessment Appearance:: Appears stated age Attitude/Demeanor/Rapport: Engaged Affect (typically observed): Appropriate Orientation: : Oriented to Self, Oriented to Place, Oriented to  Time Alcohol / Substance Use: Alcohol Use Psych Involvement: No (comment)  Admission diagnosis:  COPD (chronic obstructive pulmonary disease) (HCC) [J44.9] Hypoxia [R09.02] COPD exacerbation (HCC) [J44.1] COPD with acute exacerbation (HCC) [J44.1] Pneumonia due to infectious organism, unspecified laterality, unspecified part of lung [J18.9] Patient Active Problem List   Diagnosis Date Noted   COPD with acute exacerbation (HCC) 10/28/2024   Hypokalemia 10/28/2024   CKD stage 3a, GFR 45-59 ml/min (HCC) 10/28/2024   CKD stage 3b, GFR 30-44 ml/min (HCC) 12/17/2022   Vitamin D  insufficiency 12/17/2022   Alcohol use disorder 12/17/2022   Hyperlipidemia 10/31/2022   History of colon polyps 10/31/2022   Brain injury (HCC)    Hypothyroidism 05/13/2008   HYPERCHOLESTEROLEMIA 05/13/2008   Mild cognitive impairment 05/12/2008   Bilateral chronic knee pain 05/12/2008   Idiopathic scoliosis and kyphoscoliosis 05/12/2008   SKIN RASH 05/12/2008   PCP:  Vicci Barnie NOVAK, MD Pharmacy:   [facility] [phone] [address], [place] - [address] AT [facility] [address] [place] [address] Phone: [phone] Fax: [phone]     Social Drivers of Health (SDOH) Social History: SDOH Screenings   Food Insecurity: No Food Insecurity (10/28/2024)  Housing: Low Risk  (10/28/2024)  Transportation  Needs: No Transportation Needs (10/28/2024)  Utilities: Not At Risk (10/28/2024)  Alcohol Screen: Low Risk  (08/15/2024)  Depression (PHQ2-9): Low Risk  (08/15/2024)  Financial Resource Strain: Low Risk  (08/15/2024)  Physical Activity: Sufficiently  Active (08/15/2024)  Social Connections: Socially Isolated (10/28/2024)  Stress: No Stress Concern Present (08/15/2024)  Tobacco Use: Medium Risk (10/27/2024)  Health Literacy: Adequate Health Literacy (08/15/2024)   SDOH Interventions:     Readmission Risk Interventions    10/29/2024   11:00 AM  Readmission Risk Prevention Plan  Post Dischage Appt Complete  Medication Screening Complete  Transportation Screening Complete

## 2024-10-29 NOTE — Plan of Care (Signed)
  Problem: Education: Goal: Knowledge of General Education information will improve Description: Including pain rating scale, medication(s)/side effects and non-pharmacologic comfort measures Outcome: Progressing   Problem: Health Behavior/Discharge Planning: Goal: Ability to manage health-related needs will improve Outcome: Progressing   Problem: Clinical Measurements: Goal: Ability to maintain clinical measurements within normal limits will improve Outcome: Progressing   Problem: Safety: Goal: Ability to remain free from injury will improve Outcome: Progressing   Problem: Respiratory: Goal: Ability to maintain a clear airway will improve Outcome: Progressing   Problem: Education: Goal: Knowledge of General Education information will improve Description: Including pain rating scale, medication(s)/side effects and non-pharmacologic comfort measures Outcome: Progressing

## 2024-10-30 LAB — LEGIONELLA PNEUMOPHILA SEROGP 1 UR AG: L. pneumophila Serogp 1 Ur Ag: NEGATIVE

## 2024-10-31 ENCOUNTER — Telehealth: Payer: Self-pay | Admitting: *Deleted

## 2024-10-31 LAB — CULTURE, RESPIRATORY W GRAM STAIN: Culture: NORMAL

## 2024-10-31 NOTE — Transitions of Care (Post Inpatient/ED Visit) (Signed)
   10/31/2024  Name: Samuel Luna MRN: [id number] DOB: 1961/08/19  Today's TOC FU Call Status: Today's TOC FU Call Status:: Unsuccessful Call (1st Attempt) Unsuccessful Call (1st Attempt) Date: 10/31/24  Attempted to reach the patient regarding the most recent Inpatient/ED visit.  Follow Up Plan: Additional outreach attempts will be made to reach the patient to complete the Transitions of Care (Post Inpatient/ED visit) call.   Andrea Dimes RN, BSN [place]  Value-Based Care [facility] RN Care Manager [phone]

## 2024-11-02 LAB — CULTURE, BLOOD (ROUTINE X 2)
Culture: NO GROWTH
Culture: NO GROWTH
Special Requests: ADEQUATE
Special Requests: ADEQUATE

## 2024-11-03 ENCOUNTER — Telehealth: Payer: Self-pay | Admitting: *Deleted

## 2024-11-03 NOTE — Transitions of Care (Post Inpatient/ED Visit) (Signed)
   11/03/2024  Name: Samuel Luna MRN: [id number] DOB: 1961/10/09  Today's TOC FU Call Status: Today's TOC FU Call Status:: Unsuccessful Call (2nd Attempt) Unsuccessful Call (2nd Attempt) Date: 11/03/24  Attempted to reach the patient regarding the most recent Inpatient/ED visit.  Follow Up Plan: Additional outreach attempts will be made to reach the patient to complete the Transitions of Care (Post Inpatient/ED visit) call.   Andrea Dimes RN, BSN [place]  [facility] RN Care Manager [phone]

## 2024-11-04 ENCOUNTER — Telehealth: Payer: Self-pay | Admitting: *Deleted

## 2024-11-04 NOTE — Progress Notes (Unsigned)
   Established Patient Office Visit  Subjective   Patient ID: Samuel Luna, male    DOB: 01/08/1961  Age: 63 y.o. MRN: [id number]  No chief complaint on file.   HFU copd exac Admit date: 10/27/2024 Discharge date: 10/29/2024   Admitted From: Home Disposition: Home   Recommendations for Outpatient Follow-up:  1. Follow up with PCP in 1-2 weeks 2. Please obtain BMP/CBC in one week 3. Sending referral to pulmonary office for follow-up   Home Health: N/A Equipment/Devices: N/A   Discharge Condition: Stable CODE STATUS: Full code Diet recommendation: Low-salt diet   Discharge summary: 63 year old with hypothyroidism and hyperlipidemia, CKD stage IIIa who was recently in the ER with bronchitis and COPD exacerbation like symptoms treated with oral steroids and discharged on Advair  and albuterol  came back with 2 days of worsening dyspnea, nonproductive cough and wheezing.  In the emergency room initially on 2 L oxygen.  Afebrile.  Chest x-ray and CT scan of the chest negative for PE, enlarged pulmonary trunk, small airspace disease with surrounding ground glass density in the right upper lobe.  Patient was with significant symptoms, given multiple doses of nebulizer and admitted to hospital.   COPD with acute exacerbation: Newly diagnosed depending upon clinical symptoms.  Previous smoker. Patient treated with steroids and bronchodilator therapy.  Asymptomatic today.  On room air and mobilizing around without hypoxemia. Home with Prednisone , 4 more days Azithromycin , 3 more days Albuterol  inhaler as needed Advair  twice daily scheduled, encouraged to use it is scheduled. Pulmonology referral for spirometry and follow-up. Respiratory virus panel negative.   Hypothyroidism, hyperlipidemia, CKD stage IIIa, at about baseline.  Resume home medications.   Stable to discharge home.     Discharge Diagnoses:  Principal Problem:   COPD with acute exacerbation (HCC) Active Problems:    Idiopathic scoliosis and kyphoscoliosis   Hyperlipidemia   Alcohol use disorder   Hypokalemia   CKD stage 3a, GFR 45-59 ml/min (HCC)         {History (Optional):23778}  ROS    Objective:     There were no vitals taken for this visit. {Vitals History (Optional):23777}  Physical Exam   No results found for any visits on 11/06/24.  {Labs (Optional):23779}  The 10-year ASCVD risk score (Arnett DK, et al., 2019) is: 7.8%    Assessment & Plan:   Problem List Items Addressed This Visit   None   No follow-ups on file.    Belvie Silvan, MD

## 2024-11-04 NOTE — Transitions of Care (Post Inpatient/ED Visit) (Signed)
   11/04/2024  Name: Samuel Luna MRN: [id number] DOB: [birth year]  Today's TOC FU Call Status: Today's TOC FU Call Status:: Unsuccessful Call (3rd Attempt) Unsuccessful Call (3rd Attempt) Date: 11/04/24  Attempted to reach the patient regarding the most recent Inpatient/ED visit.  Follow Up Plan: No further outreach attempts will be made at this time. We have been unable to contact the patient.  Andrea Dimes RN, BSN [place]  [facility] RN Care Manager [phone]

## 2024-11-05 ENCOUNTER — Ambulatory Visit: Attending: Family Medicine

## 2024-11-05 DIAGNOSIS — E039 Hypothyroidism, unspecified: Secondary | ICD-10-CM

## 2024-11-06 ENCOUNTER — Ambulatory Visit: Payer: Self-pay | Admitting: Internal Medicine

## 2024-11-06 ENCOUNTER — Encounter: Payer: Self-pay | Admitting: Critical Care Medicine

## 2024-11-06 ENCOUNTER — Ambulatory Visit: Attending: Critical Care Medicine | Admitting: Critical Care Medicine

## 2024-11-06 VITALS — BP 117/73 | HR 67 | Resp 19 | Wt 215.6 lb

## 2024-11-06 DIAGNOSIS — E785 Hyperlipidemia, unspecified: Secondary | ICD-10-CM

## 2024-11-06 DIAGNOSIS — Z87898 Personal history of other specified conditions: Secondary | ICD-10-CM

## 2024-11-06 DIAGNOSIS — J441 Chronic obstructive pulmonary disease with (acute) exacerbation: Secondary | ICD-10-CM | POA: Diagnosis not present

## 2024-11-06 DIAGNOSIS — R0609 Other forms of dyspnea: Secondary | ICD-10-CM | POA: Diagnosis not present

## 2024-11-06 DIAGNOSIS — J9801 Acute bronchospasm: Secondary | ICD-10-CM | POA: Diagnosis not present

## 2024-11-06 DIAGNOSIS — Z87891 Personal history of nicotine dependence: Secondary | ICD-10-CM | POA: Insufficient documentation

## 2024-11-06 LAB — TSH: TSH: 14.2 u[IU]/mL — ABNORMAL HIGH (ref 0.450–4.500)

## 2024-11-06 MED ORDER — ALBUTEROL SULFATE HFA 108 (90 BASE) MCG/ACT IN AERS: 1.0000 | INHALATION_SPRAY | RESPIRATORY_TRACT | 2 refills | Status: AC | PRN

## 2024-11-06 MED ORDER — ROSUVASTATIN CALCIUM 20 MG PO TABS: 20.0000 mg | ORAL_TABLET | Freq: Every day | ORAL | 3 refills | Status: AC

## 2024-11-06 MED ORDER — FLUTICASONE-SALMETEROL 250-50 MCG/ACT IN AEPB: 1.0000 | INHALATION_SPRAY | Freq: Two times a day (BID) | RESPIRATORY_TRACT | 4 refills | Status: DC

## 2024-11-06 MED ORDER — LEVOTHYROXINE SODIUM 125 MCG PO TABS: 125.0000 ug | ORAL_TABLET | Freq: Every day | ORAL | 5 refills | Status: AC

## 2024-11-06 NOTE — Assessment & Plan Note (Signed)
 Not smoking for 15 years

## 2024-11-06 NOTE — Patient Instructions (Signed)
 Use your inhalers as instructed stay on the Advair  twice a day  Oxygen was good today  Call the pulmonary lung office for an appointment at [place] [phone]  Keep your existing follow-up with Dr. Vicci as scheduled

## 2024-11-06 NOTE — Assessment & Plan Note (Signed)
 Drinks only 1 or 2 beers every few days

## 2024-11-06 NOTE — Assessment & Plan Note (Signed)
 COPD with recent exacerbation now improved I reinstructed the patient as to proper use of a dry powder inhaler and HFA device. Hypoxemia resolved patient is not smoking he does have abnormalities in the right upper lobe will need be follow-up  Continue inhaled medications as prescribed and refer to pulmonary medicine

## 2024-11-12 ENCOUNTER — Ambulatory Visit

## 2024-11-12 VITALS — BP 118/78 | HR 61 | Temp 97.8°F | Ht 72.0 in | Wt 226.8 lb

## 2024-11-12 DIAGNOSIS — J439 Emphysema, unspecified: Secondary | ICD-10-CM

## 2024-11-12 DIAGNOSIS — R911 Solitary pulmonary nodule: Secondary | ICD-10-CM

## 2024-11-12 MED ORDER — FLUTICASONE-SALMETEROL 250-50 MCG/ACT IN AEPB: 1.0000 | INHALATION_SPRAY | Freq: Two times a day (BID) | RESPIRATORY_TRACT | 4 refills | Status: DC

## 2024-11-12 NOTE — Progress Notes (Signed)
 Pulmonology Office Visit   Subjective:  Patient ID: Samuel Luna, male    DOB: 02-Apr-1961  MRN: [id number]  Referred by: Raenelle Coria, MD  CC:  Chief Complaint  Patient presents with   Consult    SOB and low SaO2.  Hospital follow up for COPD exacerbation.      HPI Samuel Luna is a 63 y.o. male with hypothyroidism, hyperlipidemia, CKD stage III, COPD who presents for management of COPD post hospitalization for COPD exacerbation.  Respective notes from provider reviewed as appropriate to gather relevant information for patient care.   Discussed the use of AI scribe software for clinical note transcription with the patient, who gave verbal consent to proceed.  History of Present Illness   Samuel Luna is a 63 year old male with COPD who presents for follow-up after hospitalization for COPD exacerbation.  He was hospitalized in November for a COPD exacerbation characterized by cough, shortness of breath, and wheezing. Since discharge, he has experienced improvement with occasional non-productive cough and no wheezing. He has some chest pain, which he attributes to coughing.  He is currently able to walk about two blocks without experiencing shortness of breath. He uses Advair  (fluticasone /salmeterol) inhaler, one puff twice a day, and has not needed to use his albuterol  inhaler since hospital discharge.  He has a significant smoking history, having smoked more than two packs a day for approximately 30 years before quitting 15 years ago. He has a family history of COPD, with his brother and mother also affected by the condition.  He reports seasonal allergies with sneezing but no rhinorrhea. No family history of asthma.  He has received a flu shot and believes he received a pneumonia vaccine during his recent hospitalization.  No weight loss, chest whistling, or night sweats. He remains active, frequently walking and cycling, and has never owned a car.      COPD  history: Admitted for COPD exacerbation November 2025.  Treated with prednisone  and azithromycin  and discharged on Advair  250 and albuterol . Had prior such exacerbation in 2024.    Lung Health: Functional status: >2 block. Covid vaccine: unsure.  Influenza vaccine: UTD Pneumonococcal vaccine: June 2025 Smoking: quit 15 yrs ago. 2 pk/d x 30 yrs.  Occupational exposure/pets: used to work as administrator. Used to work cleaning as well.    PRIOR TESTS and IMAGING: PFT January 2024: Ratio preserved, no BD response, no restriction, borderline air trapping.  DLCO normal. Echo June 2024: EF 50-55%, grade 1 diastolic dysfunction, normal PASP.  CT scan November 2025 reviewed by me motion degraded ground glass nodular opacity in right upper lobe with underlying emphysematous changes  Ab Eos 10/12/24 1.1.       No data to display          Allergies: Patient has no known allergies.  Current Outpatient Medications:    albuterol  (VENTOLIN  HFA) 108 (90 Base) MCG/ACT inhaler, Inhale 1-2 puffs into the lungs every 4 (four) hours as needed for wheezing or shortness of breath., Disp: 54 each, Rfl: 2   levothyroxine  (SYNTHROID ) 125 MCG tablet, Take 1 tablet (125 mcg total) by mouth daily., Disp: 60 tablet, Rfl: 5   rosuvastatin  (CRESTOR ) 20 MG tablet, Take 1 tablet (20 mg total) by mouth daily., Disp: 90 tablet, Rfl: 3   fluticasone -salmeterol (ADVAIR  DISKUS) 250-50 MCG/ACT AEPB, Inhale 1 puff into the lungs in the morning and at bedtime., Disp: 60 each, Rfl: 4 Past Medical History:  Diagnosis Date   Arthritis  knees   Brain injury (HCC)    Chronic kidney disease    mild   COPD (chronic obstructive pulmonary disease) (HCC)    GERD (gastroesophageal reflux disease)    Scoliosis    Thyroid  disease    hypothyroidism   Past Surgical History:  Procedure Laterality Date   COLONOSCOPY     LYMPHADENECTOMY Left 12/04/2014   2 lymph nodes left arm   POLYPECTOMY     Family History  Problem  Relation Age of Onset   Congestive Heart Failure Mother    Heart attack Maternal Grandmother    Heart attack Maternal Grandfather    Colon cancer Neg Hx    Esophageal cancer Neg Hx    Liver cancer Neg Hx    Pancreatic cancer Neg Hx    Rectal cancer Neg Hx    Stomach cancer Neg Hx    Colon polyps Neg Hx    Crohn's disease Neg Hx    Ulcerative colitis Neg Hx    Social History   Socioeconomic History   Marital status: Married    Spouse name: Not on file   Number of children: Not on file   Years of education: Not on file   Highest education level: Some college, no degree  Occupational History   Occupation: Fast Food  Tobacco Use   Smoking status: Former   Smokeless tobacco: Former   Tobacco comments:    Quit 12 years ago.   Vaping Use   Vaping status: Never Used  Substance and Sexual Activity   Alcohol use: Yes    Comment: occ   Drug use: No   Sexual activity: Not on file  Other Topics Concern   Not on file  Social History Narrative   Not on file   Social Drivers of Health   Financial Resource Strain: Low Risk  (08/15/2024)   Overall Financial Resource Strain (CARDIA)    Difficulty of Paying Living Expenses: Not very hard  Food Insecurity: No Food Insecurity (10/28/2024)   Hunger Vital Sign    Worried About Running Out of Food in the Last Year: Never true    Ran Out of Food in the Last Year: Never true  Transportation Needs: No Transportation Needs (10/28/2024)   PRAPARE - Administrator, Civil Service (Medical): No    Lack of Transportation (Non-Medical): No  Physical Activity: Sufficiently Active (08/15/2024)   Exercise Vital Sign    Days of Exercise per Week: 5 days    Minutes of Exercise per Session: 30 min  Stress: No Stress Concern Present (08/15/2024)   [employer] of Occupational Health - Occupational Stress Questionnaire    Feeling of Stress: Not at all  Social Connections: Socially Isolated (10/28/2024)   Social Connection and  Isolation Panel    Frequency of Communication with Friends and Family: Once a week    Frequency of Social Gatherings with Friends and Family: Once a week    Attends Religious Services: Never    Database Administrator or Organizations: No    Attends Banker Meetings: Never    Marital Status: Married  Catering Manager Violence: Not At Risk (10/28/2024)   Humiliation, Afraid, Rape, and Kick questionnaire    Fear of Current or Ex-Partner: No    Emotionally Abused: No    Physically Abused: No    Sexually Abused: No       Objective:  BP 118/78   Pulse 61   Temp 97.8 F (36.6 C) (Temporal)  Ht 6' (1.829 m)   Wt 226 lb 12.8 oz (102.9 kg)   SpO2 93% Comment: room air  BMI 30.76 kg/m  BMI Readings from Last 3 Encounters:  11/12/24 30.76 kg/m  11/06/24 29.24 kg/m  08/15/24 29.29 kg/m    Physical Exam: Physical Exam   ENT: Normal mucosa.  PULMONARY: Lungs clear to auscultation bilaterally, no adventitious breath sounds. Chest wall tenderness present on left side.  CARDIOVASCULAR: Regular rate and rhythm, S1 S2 normal, no murmurs. ABDOMEN: Abdomen soft, nontender. Bowel sounds are normal. EXTREMITIES: No peripheral edema noted.       Diagnostic Review:  Last metabolic panel Lab Results  Component Value Date   GLUCOSE 148 (H) 10/29/2024   NA 139 10/29/2024   K 4.9 10/29/2024   CL 105 10/29/2024   CO2 22 10/29/2024   BUN 19 10/29/2024   CREATININE 1.41 (H) 10/29/2024   GFRNONAA 56 (L) 10/29/2024   CALCIUM  9.5 10/29/2024   PROT 7.5 10/12/2024   ALBUMIN 4.6 10/12/2024   LABGLOB 2.4 05/30/2024   AGRATIO 1.7 11/09/2022   BILITOT 0.6 10/12/2024   ALKPHOS 118 10/12/2024   AST 24 10/12/2024   ALT 20 10/12/2024   ANIONGAP 12 10/29/2024         Assessment & Plan:   Assessment & Plan Chronic obstructive pulmonary disease with emphysema, unspecified emphysema type (HCC)  Orders:   fluticasone -salmeterol (ADVAIR  DISKUS) 250-50 MCG/ACT AEPB; Inhale 1  puff into the lungs in the morning and at bedtime.  Solitary lung nodule  Orders:   CT Chest Wo Contrast; Future     Assessment and Plan    Chronic obstructive pulmonary disease with emphysema COPD exacerbation in November improved. Lung function test and CT confirmed COPD. No lung cancer screening needed due to smoking cessation over 15 years ago. - Continue Advair  inhaler, one puff twice daily. Eos 1.1.  - Use albuterol  inhaler as needed. - Ordered CT scan for late February 2026 to monitor pulmonary nodule.  Solitary pulmonary nodule Right upper lobe nodule likely infection-related. Differential includes possible malignancy if persistent. - Ordered repeat CT scan for late February 2026 to monitor nodule resolution.         Notes from 12//25 Dr. Belvie Silvan and DC summary 10/29/2024 reviewed as to gather relevant information for patient care and formulating plan.  He/She was counselled about not driving while drowsy which is common side effect of sleep related disorders.   Return for Sch after CT scan.   I personally spent a total of 30 minutes in the care of the patient today including preparing to see the patient, getting/reviewing separately obtained history, performing a medically appropriate exam/evaluation, counseling and educating, placing orders, documenting clinical information in the EHR, independently interpreting results, and communicating results.   Johnathin Vanderschaaf, MD

## 2024-11-12 NOTE — Patient Instructions (Signed)
°  VISIT SUMMARY: You had a follow-up appointment today to check on your COPD after your recent hospitalization. You have been feeling better with occasional cough and no wheezing. You are able to walk about two blocks without shortness of breath. You are continuing your current inhaler medications and have not needed your rescue inhaler since your hospital discharge.  YOUR PLAN: -CHRONIC OBSTRUCTIVE PULMONARY DISEASE (COPD) WITH EMPHYSEMA: COPD is a chronic lung condition that makes it hard to breathe. Your recent exacerbation has improved. Continue using your Advair  inhaler, one puff twice daily, and use your albuterol  inhaler as needed. A CT scan has been ordered for late February 2026 to monitor a pulmonary nodule.  -SOLITARY PULMONARY NODULE: A solitary pulmonary nodule is a small, round growth in the lung. It is likely related to an infection, but we need to monitor it to rule out any other causes. A repeat CT scan has been ordered for late February 2026 to check if the nodule has resolved.  INSTRUCTIONS: Please continue using your Advair  inhaler as prescribed and use your albuterol  inhaler if you experience shortness of breath. A CT scan has been scheduled for late February 2026 to monitor your pulmonary nodule. Make sure to attend this appointment. If you experience any worsening symptoms, such as increased shortness of breath, chest pain, or persistent cough, please contact our office immediately.                      Contains text generated by [employer]bridge.                                 Contains text generated by [employer]bridge.

## 2024-11-24 ENCOUNTER — Emergency Department (HOSPITAL_COMMUNITY)
Admission: EM | Admit: 2024-11-24 | Discharge: 2024-11-25 | Disposition: A | Discharge status: Home / Self Care | Attending: Emergency Medicine | Admitting: Emergency Medicine

## 2024-11-24 ENCOUNTER — Encounter (HOSPITAL_COMMUNITY): Payer: Self-pay

## 2024-11-24 ENCOUNTER — Other Ambulatory Visit: Payer: Self-pay

## 2024-11-24 ENCOUNTER — Emergency Department (HOSPITAL_COMMUNITY)

## 2024-11-24 DIAGNOSIS — N1831 Chronic kidney disease, stage 3a: Secondary | ICD-10-CM | POA: Diagnosis not present

## 2024-11-24 DIAGNOSIS — E039 Hypothyroidism, unspecified: Secondary | ICD-10-CM | POA: Insufficient documentation

## 2024-11-24 DIAGNOSIS — R0602 Shortness of breath: Secondary | ICD-10-CM | POA: Diagnosis present

## 2024-11-24 DIAGNOSIS — Z7951 Long term (current) use of inhaled steroids: Secondary | ICD-10-CM | POA: Diagnosis not present

## 2024-11-24 DIAGNOSIS — J441 Chronic obstructive pulmonary disease with (acute) exacerbation: Secondary | ICD-10-CM | POA: Insufficient documentation

## 2024-11-24 DIAGNOSIS — Z79899 Other long term (current) drug therapy: Secondary | ICD-10-CM | POA: Diagnosis not present

## 2024-11-24 LAB — CBC
HCT: 43.2 % (ref 39.0–52.0)
Hemoglobin: 14.1 g/dL (ref 13.0–17.0)
MCH: 31.1 pg (ref 26.0–34.0)
MCHC: 32.6 g/dL (ref 30.0–36.0)
MCV: 95.4 fL (ref 80.0–100.0)
Platelets: 178 K/uL (ref 150–400)
RBC: 4.53 MIL/uL (ref 4.22–5.81)
RDW: 13.3 % (ref 11.5–15.5)
WBC: 7.9 K/uL (ref 4.0–10.5)
nRBC: 0 % (ref 0.0–0.2)

## 2024-11-24 MED ORDER — IPRATROPIUM-ALBUTEROL 0.5-2.5 (3) MG/3ML IN SOLN
3.0000 mL | RESPIRATORY_TRACT | Status: AC
  Administered 2024-11-24 (×3): 3 mL via RESPIRATORY_TRACT
  Filled 2024-11-24: qty 3
  Filled 2024-11-24: qty 6

## 2024-11-24 MED ORDER — METHYLPREDNISOLONE SODIUM SUCC 125 MG IJ SOLR
125.0000 mg | Freq: Once | INTRAMUSCULAR | Status: AC
  Administered 2024-11-24: 125 mg via INTRAVENOUS
  Filled 2024-11-24: qty 2

## 2024-11-24 NOTE — ED Triage Notes (Addendum)
 Pt. Arrives for SOB. Denies CP states that it is just hard for him to breath. Audible wheezing noted pt. Has been using his inhaler with no relief.

## 2024-11-24 NOTE — ED Provider Notes (Signed)
 " [facility] AT [facility] Provider Note  CSN: [id number] Arrival date & time: 11/24/24 2224  Chief Complaint(s) Shortness of Breath  History provided by patient. HPI & MDM Samuel Luna is a 63 y.o. male here for.   Shortness of Breath   Clinical Course as of 11/25/24 0331  Tue Nov 25, 2024  0011 Patient has a past medical history of COPD using rescue inhalers without supplemental oxygen at home who presents to the emergency department with 2 days of gradually worsening shortness of breath, mostly worse with lying down.  No peripheral edema.  No chest pain.  No recent fevers or infections.  Patient attempted to use his rescue inhalers with no relief. [PC]  0012 Differential diagnosis considered.  Workup below.  Presentation is most consistent with COPD exacerbation. Obtain chest x-ray to rule out pneumonia. Lungs sounds equal bilaterally and not concerning for pneumothorax.  No evidence of volume overload on exam concerning for heart failure.  On review of records, patient does have an echocardiogram from 2024 that shows EF of 50 to 55% and grade 1 diastolic heart failure.  Given the orthopnea, will add on BNP to assess volume status.  Will also check viral panel to assess for COVID, influenza, RSV.  Given the indolent process, I have low suspicion for pulmonary embolism.  [PC]  0019 Patient treated with 2 DuoNebs and Solu-Medrol .  Plan to monitor and reassess.  [PC]  0207 BNP normal chest x-ray without evidence of pulmonary edema - HF unlikely  No pneumonia or pneumothorax on chest x-ray. CBC without leukocytosis or anemia Metabolic panel without significant electrolyte derangements or renal sufficiency  Viral panel negative for influenza, RSV, COVID.   [PC]  [id number] Patient reported significant relief following the breathing treatments.  Wheezing significantly improved  Patient sats maintained in the low to mid 90s on room air.   Ambulated well without complication.  Stable for outpatient management with albuterol  puffs and steroid prescription.  [PC]    Clinical Course User Index [PC] Samuel Luna, Samuel Moder, MD   Medical Decision Making Amount and/or Complexity of Data Reviewed Labs: ordered. Decision-making details documented in ED Course. Radiology: ordered and independent interpretation performed. Decision-making details documented in ED Course. ECG/medicine tests: ordered and independent interpretation performed. Decision-making details documented in ED Course.  Risk Prescription drug management. Decision regarding hospitalization.    Final Clinical Impression(s) / ED Diagnoses Final diagnoses:  COPD exacerbation (HCC)   The patient appears reasonably screened and/or stabilized for discharge and I doubt any other medical condition or other [facility] requiring further screening, evaluation, or treatment in the ED at this time. I have discussed the findings, Dx and Tx plan with the patient/family who expressed understanding and agree(s) with the plan. Discharge instructions discussed at length. The patient/family was given strict return precautions who verbalized understanding of the instructions. No further questions at time of discharge.  Disposition: Discharge  Condition: Good  ED Discharge Orders          Ordered    predniSONE  (DELTASONE ) 20 MG tablet  Daily with breakfast        11/25/24 0327              Follow Up: Vicci Barnie NOVAK, MD [address] [place] [address] [address] [phone]  Call  if you have not been called about your appointment     Past Medical History Past Medical History:  Diagnosis Date   Arthritis  knees   Brain injury (HCC)    Chronic kidney disease    mild   COPD (chronic obstructive pulmonary disease) (HCC)    GERD (gastroesophageal reflux disease)    Scoliosis    Thyroid  disease    hypothyroidism   Patient Active Problem List    Diagnosis Date Noted   History of alcohol use 11/06/2024   History of tobacco use 11/06/2024   COPD with acute exacerbation (HCC) 10/28/2024   Hypokalemia 10/28/2024   CKD stage 3a, GFR 45-59 ml/min (HCC) 10/28/2024   Vitamin D  insufficiency 12/17/2022   Hyperlipidemia 10/31/2022   History of colon polyps 10/31/2022   Brain injury (HCC)    Hypothyroidism 05/13/2008   HYPERCHOLESTEROLEMIA 05/13/2008   Mild cognitive impairment 05/12/2008   Bilateral chronic knee pain 05/12/2008   Idiopathic scoliosis and kyphoscoliosis 05/12/2008   SKIN RASH 05/12/2008   Home Medication(s) Prior to Admission medications  Medication Sig Start Date End Date Taking? Authorizing Provider  predniSONE  (DELTASONE ) 20 MG tablet Take 2 tablets (40 mg total) by mouth daily with breakfast for 4 days. 11/25/24 11/29/24 Yes Ferlando Lia, Samuel Moder, MD  albuterol  (VENTOLIN  HFA) 108 [phone] Base) MCG/ACT inhaler Inhale 1-2 puffs into the lungs every 4 (four) hours as needed for wheezing or shortness of breath. 11/06/24   Brien Belvie BRAVO, MD  fluticasone -salmeterol (ADVAIR  DISKUS) 250-50 MCG/ACT AEPB Inhale 1 puff into the lungs in the morning and at bedtime. 11/12/24   Pawar, Rahul, MD  levothyroxine  (SYNTHROID ) 125 MCG tablet Take 1 tablet (125 mcg total) by mouth daily. 11/06/24   Brien Belvie BRAVO, MD  rosuvastatin  (CRESTOR ) 20 MG tablet Take 1 tablet (20 mg total) by mouth daily. 11/06/24   Brien Belvie BRAVO, MD                                                                                                                                    Allergies Patient has no known allergies.  Review of Systems Review of Systems  Respiratory:  Positive for shortness of breath.    As noted in HPI  Physical Exam Vital Signs  I have reviewed the triage vital signs BP 114/65   Pulse 76   Temp 97.6 F (36.4 C) (Oral)   Resp 18   Wt 76.1 kg   SpO2 92%   BMI 22.76 kg/m   Physical Exam Vitals reviewed.  Constitutional:       General: He is not in acute distress.    Appearance: He is well-developed. He is not diaphoretic.  HENT:     Head: Normocephalic and atraumatic.     Nose: Nose normal.  Eyes:     General: No scleral icterus.       Right eye: No discharge.        Left eye: No discharge.     Conjunctiva/sclera: Conjunctivae normal.     Pupils: Pupils are equal, round, and reactive to light.  Cardiovascular:     Rate and Rhythm: Normal rate and regular rhythm.     Heart sounds: No murmur heard.    No friction rub. No gallop.  Pulmonary:     Effort: Pulmonary effort is normal. Tachypnea present. No respiratory distress.     Breath sounds: No stridor. Examination of the left-upper field reveals wheezing. Examination of the right-middle field reveals wheezing. Examination of the left-middle field reveals wheezing. Examination of the right-lower field reveals wheezing. Examination of the left-lower field reveals wheezing. Wheezing present. No rhonchi or rales.  Abdominal:     General: There is no distension.     Palpations: Abdomen is soft.     Tenderness: There is no abdominal tenderness.  Musculoskeletal:        General: No tenderness.     Cervical back: Normal range of motion and neck supple.     Right lower leg: No edema.     Left lower leg: No edema.  Skin:    General: Skin is warm and dry.     Findings: No erythema or rash.  Neurological:     Mental Status: He is alert and oriented to person, place, and time.     ED Results and Treatments Labs (all labs ordered are listed, but only abnormal results are displayed) Labs Reviewed  BASIC METABOLIC PANEL WITH GFR - Abnormal; Notable for the following components:      Result Value   Glucose, Bld 102 (*)    All other components within normal limits  RESP PANEL BY RT-PCR (RSV, FLU A&B, COVID)  [id number]  CBC  PRO BRAIN NATRIURETIC PEPTIDE                                                                                                                          EKG  EKG Interpretation Date/Time:  Monday November 24 2024 22:53:02 EST Ventricular Rate:  85 PR Interval:  166 QRS Duration:  90 QT Interval:  362 QTC Calculation: 431 R Axis:   50  Text Interpretation: Sinus rhythm Confirmed by Trine Likes [phone]) on 11/24/2024 11:13:23 PM       Radiology DG Chest 2 View Result Date: 11/24/2024 EXAM: 2 VIEW(S) XRAY OF THE CHEST 11/24/2024 11:26:00 PM COMPARISON: 10/27/2024 CLINICAL HISTORY: SOB FINDINGS: LUNGS AND PLEURA: No focal pulmonary opacity. No pleural effusion. No pneumothorax. HEART AND MEDIASTINUM: Aortic calcification. No acute abnormality of the cardiac and mediastinal silhouettes. BONES AND SOFT TISSUES: Left breast surgical clips noted. No acute osseous abnormality. IMPRESSION: 1. No acute findings. Electronically signed by: Norman Gatlin MD 11/24/2024 11:31 PM EST RP Workstation: [id number]    Medications Ordered in ED Medications  ipratropium-albuterol  (DUONEB) 0.5-2.5 (3) MG/3ML nebulizer solution 3 mL (3 mLs Nebulization Given 11/24/24 2344)  methylPREDNISolone  sodium succinate (SOLU-MEDROL ) 125 mg/2 mL injection 125 mg (125 mg Intravenous Given 11/24/24 2324)  AeroChamber Plus Flo-Vu Medium MISC 1 each (1 each Other Given 11/25/24 0223)   Procedures Procedures  (including critical  care time)   This chart was dictated using voice recognition software.  Despite best efforts to proofread,  errors can occur which can change the documentation meaning.   Trine Samuel Moder, MD 11/25/24 [phone]  "

## 2024-11-25 LAB — BASIC METABOLIC PANEL WITH GFR
Anion gap: 12 (ref 5–15)
BUN: 22 mg/dL (ref 8–23)
CO2: 27 mmol/L (ref 22–32)
Calcium: 10.1 mg/dL (ref 8.9–10.3)
Chloride: 104 mmol/L (ref 98–111)
Creatinine, Ser: 1.1 mg/dL (ref 0.61–1.24)
GFR, Estimated: >60 mL/min
Glucose, Bld: 102 mg/dL — ABNORMAL HIGH (ref 70–99)
Potassium: 4.4 mmol/L (ref 3.5–5.1)
Sodium: 142 mmol/L (ref 135–145)

## 2024-11-25 LAB — PRO BRAIN NATRIURETIC PEPTIDE: Pro Brain Natriuretic Peptide: <50 pg/mL

## 2024-11-25 LAB — RESP PANEL BY RT-PCR (RSV, FLU A&B, COVID)  RVPGX2
Influenza A by PCR: NEGATIVE
Influenza B by PCR: NEGATIVE
Resp Syncytial Virus by PCR: NEGATIVE
SARS Coronavirus 2 by RT PCR: NEGATIVE

## 2024-11-25 MED ORDER — PREDNISONE 20 MG PO TABS: 40.0000 mg | ORAL_TABLET | Freq: Every day | ORAL | 0 refills | Status: AC

## 2024-11-25 MED ORDER — AEROCHAMBER PLUS FLO-VU MEDIUM MISC
1.0000 | Freq: Once | Status: AC
  Administered 2024-11-25: 1

## 2024-11-25 NOTE — ED Notes (Signed)
Pt placed on 2 L Normal.  

## 2024-11-25 NOTE — ED Notes (Signed)
 Pt taken off of O2

## 2024-11-25 NOTE — ED Notes (Signed)
Pt ambulatory with no complications.

## 2024-11-25 NOTE — Discharge Instructions (Signed)
Albuterol inhaler: 2-4 puffs every 4-6 hours if needed for wheezing or shortness of breath.  

## 2024-12-15 ENCOUNTER — Ambulatory Visit: Attending: Internal Medicine | Admitting: Internal Medicine

## 2024-12-15 ENCOUNTER — Encounter: Payer: Self-pay | Admitting: Internal Medicine

## 2024-12-15 VITALS — BP 114/77 | HR 67 | Temp 97.8°F | Ht 72.0 in | Wt 223.0 lb

## 2024-12-15 DIAGNOSIS — R911 Solitary pulmonary nodule: Secondary | ICD-10-CM | POA: Diagnosis not present

## 2024-12-15 DIAGNOSIS — I7121 Aneurysm of the ascending aorta, without rupture: Secondary | ICD-10-CM | POA: Diagnosis not present

## 2024-12-15 DIAGNOSIS — E039 Hypothyroidism, unspecified: Secondary | ICD-10-CM | POA: Diagnosis not present

## 2024-12-15 DIAGNOSIS — J439 Emphysema, unspecified: Secondary | ICD-10-CM | POA: Diagnosis not present

## 2024-12-15 DIAGNOSIS — E785 Hyperlipidemia, unspecified: Secondary | ICD-10-CM

## 2024-12-15 MED ORDER — PREDNISONE 20 MG PO TABS: 20.0000 mg | ORAL_TABLET | Freq: Every day | ORAL | 0 refills | Status: AC

## 2024-12-15 MED ORDER — FLUTICASONE-SALMETEROL 500-50 MCG/ACT IN AEPB: 1.0000 | INHALATION_SPRAY | Freq: Two times a day (BID) | RESPIRATORY_TRACT | 6 refills | Status: AC

## 2024-12-15 NOTE — Progress Notes (Signed)
 "   Patient ID: Samuel Luna, male    DOB: 1960-12-13  MRN: [id number]  CC: Hypothyroidism (Hypothyroidism f/u./On-going wheezing - worse in the morning & night. Face changing color when experiencing SOB - requesting nebulizer /Already received flu vax. )   Subjective: Samuel Luna is a 64 y.o. male who presents for chronic ds management.  Wife is  with him.  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  History of Present Illness Samuel Luna is a 64 year old male with COPD who presents with increased shortness of breath and wheezing.  He has a history of COPD and was hospitalized for an exacerbation in November. He est care with pulmonary Dr. Theodoro since discgh and is on Advair  250/50 and Albuterol  inhalers.  Recently he has experienced increased shortness of breath and wheezing, particularly in the mornings and with physical activity such as walking. He uses his albuterol  inhaler almost every morning and four to five times a day due to difficulty breathing and wheezing. He also uses Advair , one puff twice a day. Coffee surprisingly per his wife provides temporary relief for his symptoms.  He is currently taking levothyroxine  125 mcg daily for hypothyroidism. In December, his TSH level was elevated at 14. I had sent a Mychart message asking to confirm that he was taking the 125 mcg of Levothyroxine  but his wife did not reply.  She thinks he was mistakenly taking a different dose which she corrected to 125 mcg about three weeks ago.  He is also on rosuvastatin  for cholesterol management, which he takes daily.  A CT scan in November showed a lung nodule and pulmoanry order noncontrast CT lung to  be done end of Feb. incidental finding on the CTA of the chest that he had done in the hospital also revealed mild dilated ascending aortic aneurysm.    Patient Active Problem List   Diagnosis Date Noted   History of alcohol use 11/06/2024   History of tobacco use 11/06/2024   COPD with acute exacerbation (HCC) 10/28/2024   Hypokalemia 10/28/2024   CKD stage 3a, GFR 45-59 ml/min (HCC) 10/28/2024   Vitamin D  insufficiency 12/17/2022   Hyperlipidemia 10/31/2022   History of colon polyps 10/31/2022   Brain injury (HCC)    Hypothyroidism 05/13/2008   HYPERCHOLESTEROLEMIA 05/13/2008   Mild cognitive impairment 05/12/2008   Bilateral chronic knee pain 05/12/2008   Idiopathic scoliosis and kyphoscoliosis 05/12/2008   SKIN RASH 05/12/2008     Medications Ordered Prior to Encounter[1]  Allergies[2]  Social History   Socioeconomic History   Marital status: Married    Spouse name: Not on file   Number of children: Not on file   Years of education: Not on file   Highest education level: 12th grade  Occupational History   Occupation: [employer]  Tobacco Use   Smoking status: Former   Smokeless tobacco: Former   Tobacco comments:    Quit 12 years ago.   Vaping Use   Vaping status: Never Used  Substance and Sexual Activity   Alcohol use: Yes    Comment: occ   Drug use: No   Sexual activity: Not on file  Other Topics Concern   Not on file  Social History Narrative   Not on file   Social Drivers of Health   Tobacco Use: Medium Risk (11/24/2024)   Patient History    Smoking Tobacco Use: Former    Smokeless Tobacco Use: Former    Passive Exposure: Not on Actuary Strain: Low Risk (12/13/2024)   Overall Financial Resource Strain (CARDIA)    Difficulty of Paying Living Expenses: Not very hard  Food Insecurity: Food Insecurity Present (12/13/2024)   Epic    Worried About Programme Researcher, Broadcasting/film/video in the Last Year: Sometimes true    Ran Out of Food in the Last Year:  Never true  Transportation Needs: No Transportation Needs (12/13/2024)   Epic    Lack of Transportation (Medical): No    Lack of Transportation (Non-Medical): No  Physical Activity: Sufficiently Active (12/13/2024)   Exercise Vital Sign    Days of Exercise per Week: 6 days    Minutes of Exercise per Session: 30 min  Stress: Stress Concern Present (12/13/2024)   [employer] of Occupational Health - Occupational Stress Questionnaire    Feeling of Stress: Very much  Social Connections: Moderately Isolated (12/13/2024)   Social Connection and Isolation Panel    Frequency of Communication with Friends and Family: Once a week    Frequency of Social Gatherings with Friends and Family: Once a week    Attends Religious Services: More than 4 times per year    Active Member of [employer] or Organizations: No    Attends Banker Meetings: Not on file    Marital Status: Married  Intimate Partner Violence: Not At Risk (10/28/2024)   Epic    Fear of Current or Ex-Partner: No    Emotionally Abused: No    Physically Abused: No    Sexually Abused: No  Depression (PHQ2-9): Low Risk (08/15/2024)   Depression (PHQ2-9)    PHQ-2 Score: 1  Alcohol Screen: Low Risk (12/13/2024)   Alcohol Screen    Last Alcohol Screening Score (AUDIT): 2  Housing: Low Risk (12/13/2024)   Epic    Unable to Pay for Housing in the Last Year: No    Number of Times Moved in the Last Year: 0    Homeless in the Last Year: No  Utilities: Not At Risk (10/28/2024)   Epic    Threatened with loss of utilities: No  Health Literacy: Adequate Health Literacy (08/15/2024)   B1300 Health Literacy    Frequency of need for help with medical instructions: Rarely    Family History  Problem Relation Age of Onset   Congestive Heart Failure Mother    Heart attack Maternal Grandmother    Heart attack Maternal Grandfather    Colon cancer Neg Hx    Esophageal cancer Neg Hx    Liver cancer Neg Hx    Pancreatic cancer Neg Hx     Rectal cancer Neg Hx    Stomach cancer Neg Hx    Colon polyps Neg Hx    Crohn's disease Neg Hx    Ulcerative colitis Neg Hx     Past Surgical History:  Procedure Laterality Date   COLONOSCOPY     LYMPHADENECTOMY Left 12/04/2014   2 lymph nodes left arm   POLYPECTOMY      ROS: Review of Systems Negative except as stated above  PHYSICAL EXAM: BP 114/77 (BP Location: Left Arm, Patient Position: Sitting, Cuff Size: Large)   Pulse 67   Temp 97.8 F (36.6 C) (Oral)   Ht 6' (  1.829 m)   Wt 223 lb (101.2 kg)   SpO2 95%   BMI 30.24 kg/m   Physical Exam   General appearance - alert, well appearing, and in no distress Mental status - pt is forgetful Neck - supple, no significant adenopathy Chest -patient with mouth scattered wheezes.  Decreased breath sounds. Heart - normal rate, regular rhythm, normal S1, S2, no murmurs, rubs, clicks or gallops     Latest Ref Rng & Units 11/24/2024   11:31 PM 10/29/2024    4:38 AM 10/28/2024   12:29 PM  CMP  Glucose 70 - 99 mg/dL 897  851  854   BUN 8 - 23 mg/dL 22  19  12    Creatinine 0.61 - 1.24 mg/dL 8.89  8.58  8.72   Sodium 135 - 145 mmol/L 142  139  139   Potassium 3.5 - 5.1 mmol/L 4.4  4.9  4.5   Chloride 98 - 111 mmol/L 104  105  103   CO2 22 - 32 mmol/L 27  22  23    Calcium  8.9 - 10.3 mg/dL 89.8  9.5  9.8    Lipid Panel     Component Value Date/Time   CHOL 185 05/30/2024 1536   TRIG 198 (H) 05/30/2024 1536   HDL 41 05/30/2024 1536   CHOLHDL 4.5 05/30/2024 1536   CHOLHDL 4.3 Ratio 12/15/2008 2222   VLDL 27 12/15/2008 2222   LDLCALC 109 (H) 05/30/2024 1536    CBC    Component Value Date/Time   WBC 7.9 11/24/2024 2331   RBC 4.53 11/24/2024 2331   HGB 14.1 11/24/2024 2331   HGB 14.7 05/30/2024 1536   HCT 43.2 11/24/2024 2331   HCT 45.4 05/30/2024 1536   PLT 178 11/24/2024 2331   PLT 165 05/30/2024 1536   MCV 95.4 11/24/2024 2331   MCV 92 05/30/2024 1536   MCH 31.1 11/24/2024 2331   MCHC 32.6 11/24/2024 2331    RDW 13.3 11/24/2024 2331   RDW 12.2 05/30/2024 1536   LYMPHSABS 1.7 10/12/2024 1648   LYMPHSABS 1.7 02/28/2024 1519   MONOABS 0.8 10/12/2024 1648   EOSABS 1.1 (H) 10/12/2024 1648   EOSABS 0.2 02/28/2024 1519   BASOSABS 0.1 10/12/2024 1648   BASOSABS 0.1 02/28/2024 1519    ASSESSMENT AND PLAN: 1. Chronic obstructive pulmonary disease with emphysema, unspecified emphysema type (HCC) Not well-controlled.  Will increase her Advair  to 500/51 puff twice a day.  Will give 4 days of prednisone  as it seemed he is having a mild flare given increased use of albuterol . - fluticasone -salmeterol (ADVAIR ) 500-50 MCG/ACT AEPB; Inhale 1 puff into the lungs in the morning and at bedtime.  Dispense: 1 each; Refill: 6 - predniSONE  (DELTASONE ) 20 MG tablet; Take 1 tablet (20 mg total) by mouth daily with breakfast.  Dispense: 4 tablet; Refill: 0  2. Hyperlipidemia, unspecified hyperlipidemia type (Primary) Continue rosuvastatin .  3. Hypothyroidism, unspecified type Wife reports that patient has been on the levothyroxine  125 mcg for the past 3 weeks.  Will have him return to the lab in 3 weeks to have TSH rechecked. - TSH; Future  4. Lung nodule Pulmonologist has ordered lung nodule to be done the end of next month  5. Aneurysm of ascending aorta without rupture Discussed diagnosis with patient and wife.  Will need repeat monitoring in 1 year.   Patient was given the opportunity to ask questions.  Patient verbalized understanding of the plan and was able to repeat key elements of the plan.  This documentation was completed using Paediatric nurse.  Any transcriptional errors are unintentional.  Orders Placed This Encounter  Procedures   TSH     Requested Prescriptions   Signed Prescriptions Disp Refills   fluticasone -salmeterol (ADVAIR ) 500-50 MCG/ACT AEPB 1 each 6    Sig: Inhale 1 puff into the lungs in the morning and at bedtime.   predniSONE  (DELTASONE ) 20 MG tablet 4  tablet 0    Sig: Take 1 tablet (20 mg total) by mouth daily with breakfast.    Return in about 4 months (around 04/14/2025) for Give lab appt for 3 wks from now.SABRA Barnie Louder, MD, FACP     [1]  Current Outpatient Medications on File Prior to Visit  Medication Sig Dispense Refill   albuterol  (VENTOLIN  HFA) 108 (90 Base) MCG/ACT inhaler Inhale 1-2 puffs into the lungs every 4 (four) hours as needed for wheezing or shortness of breath. 54 each 2   levothyroxine  (SYNTHROID ) 125 MCG tablet Take 1 tablet (125 mcg total) by mouth daily. 60 tablet 5   rosuvastatin  (CRESTOR ) 20 MG tablet Take 1 tablet (20 mg total) by mouth daily. 90 tablet 3   No current facility-administered medications on file prior to visit.  [2] No Known Allergies  "

## 2024-12-15 NOTE — Patient Instructions (Signed)
" °  VISIT SUMMARY: Samuel Luna, a 64 year old male with COPD, presented with increased shortness of breath and wheezing. He has a history of COPD exacerbation, hypothyroidism, a lung nodule, and a mildly dilated aorta. He is currently on medications for COPD, hypothyroidism, and cholesterol management.  YOUR PLAN: -CHRONIC OBSTRUCTIVE PULMONARY DISEASE WITH EMPHYSEMA: COPD is a chronic lung disease that causes obstructed airflow from the lungs. You have experienced a recent exacerbation with increased shortness of breath and wheezing. We have increased your Advair  dose to 500/50 mcg twice daily and prescribed prednisone  for wheezing. Please ensure your pharmacy fills the new Advair  prescription with the updated dose.  -HYPOTHYROIDISM: Hypothyroidism is a condition where the thyroid  gland does not produce enough thyroid  hormone. Your TSH level was elevated in December, and you have been on levothyroxine  125 mcg daily for three weeks. We will recheck your TSH level in 2-3 weeks to ensure it is within the normal range.  -LUNG NODULE: A lung nodule is a small growth in the lung that can be benign or malignant. A nodule was identified on your CT scan in November, and a follow-up CT scan is scheduled for February. Please contact [facility] to schedule your CT scan.  -ANEURYSM OF ASCENDING AORTA: An aneurysm of the ascending aorta is a dilation of the aorta that can lead to serious complications if not monitored. Mild dilation was noted on your CT scan in November. We will schedule a contrast CT scan of your chest in November to monitor the aortic aneurysm.  -HYPERLIPIDEMIA: Hyperlipidemia is a condition with high levels of lipids (fats) in the blood. You are currently taking rosuvastatin  daily to manage your cholesterol levels. Please continue taking rosuvastatin  as prescribed.  INSTRUCTIONS: Please follow up in 2-3 weeks for a recheck of your TSH level. Additionally, contact [facility] to  schedule your follow-up CT scan for the lung nodule in February. We will also schedule a contrast CT scan of your chest in November to monitor the aortic aneurysm.                      Contains text generated by Abridge.                                 Contains text generated by Abridge.   "

## 2024-12-22 ENCOUNTER — Telehealth: Payer: Self-pay

## 2024-12-22 NOTE — Telephone Encounter (Signed)
 Copied from CRM [id number]. Topic: Appointments - Scheduling Inquiry for Clinic >> Dec 19, 2024  2:15 PM Samuel Luna wrote: Reason for CRM: pt calling back to schedule his CT scan  Returned patient's call---no answer and no voice mail

## 2025-01-06 ENCOUNTER — Ambulatory Visit

## 2025-01-30 ENCOUNTER — Other Ambulatory Visit

## 2025-02-24 ENCOUNTER — Ambulatory Visit

## 2025-04-16 ENCOUNTER — Ambulatory Visit: Payer: Self-pay | Admitting: Internal Medicine

## 2025-04-30 ENCOUNTER — Ambulatory Visit: Admitting: Cardiology
